# Patient Record
Sex: Female | Born: 1955 | Race: Black or African American | Hispanic: No | Marital: Married | Smoking: Never smoker
Health system: Southern US, Community
[De-identification: ages and names within clinical notes are randomized; demographics above are authoritative.]

## PROBLEM LIST (undated history)

## (undated) DIAGNOSIS — F419 Anxiety disorder, unspecified: Secondary | ICD-10-CM

## (undated) DIAGNOSIS — I10 Essential (primary) hypertension: Secondary | ICD-10-CM

## (undated) DIAGNOSIS — D509 Iron deficiency anemia, unspecified: Secondary | ICD-10-CM

---

## 1997-12-05 ENCOUNTER — Ambulatory Visit (HOSPITAL_COMMUNITY): Admission: RE | Admit: 1997-12-05 | Discharge: 1997-12-05 | Payer: Self-pay | Admitting: Obstetrics & Gynecology

## 1998-03-16 ENCOUNTER — Other Ambulatory Visit: Admission: RE | Admit: 1998-03-16 | Discharge: 1998-03-16 | Payer: Self-pay | Admitting: Obstetrics & Gynecology

## 2002-05-03 ENCOUNTER — Other Ambulatory Visit: Admission: RE | Admit: 2002-05-03 | Discharge: 2002-05-03 | Payer: Self-pay | Admitting: Obstetrics and Gynecology

## 2002-05-09 ENCOUNTER — Encounter: Payer: Self-pay | Admitting: Obstetrics and Gynecology

## 2002-05-09 ENCOUNTER — Ambulatory Visit (HOSPITAL_COMMUNITY): Admission: RE | Admit: 2002-05-09 | Discharge: 2002-05-09 | Payer: Self-pay | Admitting: Obstetrics and Gynecology

## 2012-05-22 ENCOUNTER — Emergency Department (HOSPITAL_COMMUNITY): Payer: BC Managed Care – PPO

## 2012-05-22 ENCOUNTER — Encounter (HOSPITAL_COMMUNITY): Payer: Self-pay | Admitting: Emergency Medicine

## 2012-05-22 ENCOUNTER — Emergency Department (HOSPITAL_COMMUNITY)
Admission: EM | Admit: 2012-05-22 | Discharge: 2012-05-22 | Disposition: A | Discharge status: Home / Self Care | Payer: BC Managed Care – PPO | Attending: Emergency Medicine | Admitting: Emergency Medicine

## 2012-05-22 DIAGNOSIS — I10 Essential (primary) hypertension: Secondary | ICD-10-CM | POA: Insufficient documentation

## 2012-05-22 DIAGNOSIS — Z862 Personal history of diseases of the blood and blood-forming organs and certain disorders involving the immune mechanism: Secondary | ICD-10-CM | POA: Insufficient documentation

## 2012-05-22 DIAGNOSIS — R42 Dizziness and giddiness: Secondary | ICD-10-CM | POA: Insufficient documentation

## 2012-05-22 DIAGNOSIS — R51 Headache: Secondary | ICD-10-CM | POA: Insufficient documentation

## 2012-05-22 DIAGNOSIS — M7989 Other specified soft tissue disorders: Secondary | ICD-10-CM | POA: Insufficient documentation

## 2012-05-22 DIAGNOSIS — Z8659 Personal history of other mental and behavioral disorders: Secondary | ICD-10-CM | POA: Insufficient documentation

## 2012-05-22 DIAGNOSIS — I160 Hypertensive urgency: Secondary | ICD-10-CM

## 2012-05-22 DIAGNOSIS — R110 Nausea: Secondary | ICD-10-CM | POA: Insufficient documentation

## 2012-05-22 HISTORY — DX: Iron deficiency anemia, unspecified: D50.9

## 2012-05-22 HISTORY — DX: Anxiety disorder, unspecified: F41.9

## 2012-05-22 HISTORY — DX: Essential (primary) hypertension: I10

## 2012-05-22 LAB — CBC
Hemoglobin: 14.1 g/dL (ref 12.0–15.0)
MCH: 28.4 pg (ref 26.0–34.0)
MCHC: 35.1 g/dL (ref 30.0–36.0)
MCV: 81 fL (ref 78.0–100.0)
Platelets: 223 10*3/uL (ref 150–400)
RBC: 4.96 MIL/uL (ref 3.87–5.11)
RDW: 12.6 % (ref 11.5–15.5)

## 2012-05-22 LAB — BASIC METABOLIC PANEL
BUN: 10 mg/dL (ref 6–23)
Chloride: 99 mEq/L (ref 96–112)
Creatinine, Ser: 0.74 mg/dL (ref 0.50–1.10)
GFR calc Af Amer: >90 mL/min (ref >90)
GFR calc non Af Amer: >90 mL/min (ref >90)
Glucose, Bld: 108 mg/dL — ABNORMAL HIGH (ref 70–99)
Potassium: 3.4 mEq/L — ABNORMAL LOW (ref 3.5–5.1)
Sodium: 138 mEq/L (ref 135–145)

## 2012-05-22 LAB — POCT I-STAT TROPONIN I: Troponin i, poc: 0 ng/mL (ref 0.00–0.08)

## 2012-05-22 MED ORDER — ONDANSETRON HCL 4 MG PO TABS: 4.0000 mg | ORAL_TABLET | Freq: Four times a day (QID) | ORAL | Status: DC

## 2012-05-22 MED ORDER — ONDANSETRON HCL 4 MG/2ML IJ SOLN
4.0000 mg | Freq: Once | INTRAMUSCULAR | Status: AC
  Administered 2012-05-22: 4 mg via INTRAVENOUS
  Filled 2012-05-22: qty 2

## 2012-05-22 MED ORDER — ALBUTEROL SULFATE HFA 108 (90 BASE) MCG/ACT IN AERS
2.0000 | INHALATION_SPRAY | Freq: Once | RESPIRATORY_TRACT | Status: AC
  Administered 2012-05-22: 2 via RESPIRATORY_TRACT
  Filled 2012-05-22: qty 6.7

## 2012-05-22 MED ORDER — ALBUTEROL SULFATE (5 MG/ML) 0.5% IN NEBU
5.0000 mg | INHALATION_SOLUTION | Freq: Once | RESPIRATORY_TRACT | Status: AC
  Administered 2012-05-22: 5 mg via RESPIRATORY_TRACT
  Filled 2012-05-22: qty 1

## 2012-05-22 MED ORDER — LABETALOL HCL 5 MG/ML IV SOLN
5.0000 mg | Freq: Once | INTRAVENOUS | Status: AC
  Administered 2012-05-22: 5 mg via INTRAVENOUS
  Filled 2012-05-22: qty 4

## 2012-05-22 NOTE — ED Notes (Addendum)
Per EMS pt came from PCP to be evaluated for high blood pressure - 240/120. Pt was at PCP to be evaluated for migraine. Pt has hx of HTN. PT got 4mg  zofran IV for nausea. Pt now states HA feels better and has no other complaints.

## 2012-05-22 NOTE — ED Provider Notes (Signed)
History     CSN: [id number]  Arrival date & time 05/22/12  1643   First MD Initiated Contact with Patient 05/22/12 1652      Chief Complaint  Patient presents with  . Hypertension  . Nausea    (Consider location/radiation/quality/duration/timing/severity/associated sxs/prior treatment) HPI Comments: 57 year old female with a history of hypertension presents to the emergency department from her PCPs office to be evaluated for high blood pressure. Patient states when she woke up this morning she was feeling nauseous with a headache, went to see her primary care physician around 3 clock this afternoon where her blood pressure was 240/120. Denies history of migraines. She received Zofran for her nausea and states that it helped her headache has decreased from 10/10 to 4/10. Still slightly nauseated. Currently no medication changes for her high blood pressure. She takes both spironolactone and a beta blocker. Admits to leg swelling around her ankles worsening over the past few months. Denies shortness of breath, however states it is difficult to take a deep breath in. This morning she was feeling dizzy which has since subsided. Denies visual disturbance, chest pain, cough or recent illness.  Patient is a 57 y.o. female presenting with hypertension. The history is provided by the patient.  Hypertension Associated symptoms include headaches and nausea. Pertinent negatives include no chest pain, chills, coughing, fever, neck pain or vomiting.    Past Medical History  Diagnosis Date  . Hypertension   . Iron (Fe) deficiency anemia   . Anxiety     History reviewed. No pertinent past surgical history.  No family history on file.  History  Substance Use Topics  . Smoking status: Not on file  . Smokeless tobacco: Not on file  . Alcohol Use:     OB History    Grav Para Term Preterm Abortions TAB SAB Ect Mult Living                  Review of Systems  Constitutional: Negative for  fever, chills and appetite change.  HENT: Negative for neck pain and neck stiffness.   Eyes: Negative for visual disturbance.  Respiratory: Negative for cough and shortness of breath.   Cardiovascular: Positive for leg swelling. Negative for chest pain.  Gastrointestinal: Positive for nausea. Negative for vomiting.  Skin: Negative for color change.  Neurological: Positive for dizziness and headaches. Negative for light-headedness.  All other systems reviewed and are negative.    Allergies  Aspirin; Darvon; and Valium  Home Medications  No current outpatient prescriptions on file.  BP 220/98  Pulse 99  Resp 18  SpO2 98%  Physical Exam  Nursing note and vitals reviewed. Constitutional: She is oriented to person, place, and time. She appears well-developed and well-nourished. No distress.  HENT:  Head: Normocephalic and atraumatic.  Mouth/Throat: Oropharynx is clear and moist.  Eyes: Conjunctivae normal and EOM are normal. Pupils are equal, round, and reactive to light.  Neck: Normal range of motion. Neck supple. No JVD present.  Cardiovascular: Regular rhythm and intact distal pulses.  Tachycardia present.        +1 pitting edema bilateral ankles  Pulmonary/Chest: Effort normal and breath sounds normal. She has no decreased breath sounds. She has no wheezes. She has no rhonchi. She has no rales.       Poor air movement.  Abdominal: Soft. Bowel sounds are normal. There is no tenderness.  Musculoskeletal: Normal range of motion. She exhibits no edema.  Neurological: She is alert and oriented to  person, place, and time. She has normal strength. No cranial nerve deficit or sensory deficit.  Skin: Skin is warm and dry.  Psychiatric: She has a normal mood and affect. Her behavior is normal.    ED Course  Procedures (including critical care time)  Labs Reviewed  BASIC METABOLIC PANEL - Abnormal; Notable for the following:    Potassium 3.4 (*)     Glucose, Bld 108 (*)     All  other components within normal limits  CBC  POCT I-STAT TROPONIN I   Dg Chest 2 View  05/22/2012  *RADIOLOGY REPORT*  Clinical Data: Hypertension and nausea.  CHEST - 2 VIEW  Comparison: None.  Findings: Lungs are clear.  Heart size upper normal.  No pneumothorax or pleural effusion.  IMPRESSION: No acute disease.   Original Report Authenticated By: Holley Dexter, M.D.     Date: 05/22/2012  Rate: 91  Rhythm: normal sinus rhythm  QRS Axis: normal  Intervals: normal  ST/T Wave abnormalities: normal  Conduction Disutrbances:none  Narrative Interpretation: sinus rhythm, biatrial abnormalities  Old EKG Reviewed: none available    1. Hypertensive urgency   2. Nausea       MDM  57 y/o female with HTN urgency of 240/120 in PCPs office today. BP in ED 208/86. Initially nauseated. Headache decreased prior to being seen. Labs unremarkable. Troponin negative. EKG and CXR unremarkable. 6:21 PM Nausea subsided with zofran. 5mg  IV labetolol given prior to nausea subsiding. Blood pressure 186/80. Patient still is having difficulty taking a deep breath in, but denies SOB. Will give breathing treatment and reassess. No concern for PE. She does not meet [facility]'s criteria for PE. Case discussed with Dr. Juleen China who agrees with plan of care. 6:49 PM Blood pressure 180/80 on monitor in room. States breathing treatment made it a little easier to take a deep breath. Lung sounds clear. Still without nausea. Albuterol inhaler given. She will f/u with PCP this week for further blood pressure control. Return precautions discussed. Patient states understanding of plan and is agreeable.        Trevor Mace, PA-C 05/22/12 1850

## 2012-05-24 ENCOUNTER — Ambulatory Visit
Admission: RE | Admit: 2012-05-24 | Discharge: 2012-05-24 | Disposition: A | Discharge status: Home / Self Care | Payer: BC Managed Care – PPO | Source: Ambulatory Visit | Attending: Family Medicine | Admitting: Family Medicine

## 2012-05-24 ENCOUNTER — Other Ambulatory Visit: Payer: Self-pay | Admitting: Family Medicine

## 2012-05-24 DIAGNOSIS — R42 Dizziness and giddiness: Secondary | ICD-10-CM

## 2012-05-25 NOTE — ED Provider Notes (Signed)
Medical screening examination/treatment/procedure(s) were performed by non-physician practitioner and as supervising physician I was immediately available for consultation/collaboration.  Jamea Robicheaux, MD 05/25/12 0741 

## 2017-06-09 ENCOUNTER — Emergency Department (HOSPITAL_COMMUNITY)
Admission: EM | Admit: 2017-06-09 | Discharge: 2017-06-09 | Discharge status: Against Medical Advice | Payer: BLUE CROSS/BLUE SHIELD

## 2017-06-09 ENCOUNTER — Encounter (HOSPITAL_COMMUNITY): Payer: Self-pay | Admitting: Family Medicine

## 2017-06-09 ENCOUNTER — Ambulatory Visit (HOSPITAL_COMMUNITY)
Admission: EM | Admit: 2017-06-09 | Discharge: 2017-06-09 | Disposition: A | Discharge status: Home / Self Care | Payer: BLUE CROSS/BLUE SHIELD | Attending: Internal Medicine | Admitting: Internal Medicine

## 2017-06-09 DIAGNOSIS — I10 Essential (primary) hypertension: Secondary | ICD-10-CM | POA: Diagnosis not present

## 2017-06-09 DIAGNOSIS — I160 Hypertensive urgency: Secondary | ICD-10-CM | POA: Diagnosis not present

## 2017-06-09 LAB — POCT I-STAT, CHEM 8
BUN: 6 mg/dL (ref 6–20)
CALCIUM ION: 1.2 mmol/L (ref 1.15–1.40)
Chloride: 104 mmol/L (ref 101–111)
Creatinine, Ser: 0.9 mg/dL (ref 0.44–1.00)
GLUCOSE: 103 mg/dL — AB (ref 65–99)
HCT: 44 % (ref 36.0–46.0)
HEMOGLOBIN: 15 g/dL (ref 12.0–15.0)
Potassium: 3.9 mmol/L (ref 3.5–5.1)
Sodium: 144 mmol/L (ref 135–145)
TCO2: 28 mmol/L (ref 22–32)

## 2017-06-09 MED ORDER — LISINOPRIL-HYDROCHLOROTHIAZIDE 20-25 MG PO TABS: 2.0000 | ORAL_TABLET | Freq: Every day | ORAL | 0 refills | Status: DC

## 2017-06-09 MED ORDER — CLONIDINE HCL 0.1 MG PO TABS
0.1000 mg | ORAL_TABLET | Freq: Once | ORAL | Status: AC
  Administered 2017-06-09: 0.1 mg via ORAL

## 2017-06-09 MED ORDER — CLONIDINE HCL 0.1 MG PO TABS
ORAL_TABLET | ORAL | Status: AC
  Filled 2017-06-09: qty 1

## 2017-06-09 NOTE — ED Triage Notes (Signed)
[facility] stating patient left the ED and wanted to be seen there.

## 2017-06-09 NOTE — ED Provider Notes (Signed)
[facility]    CSN: [id number] Arrival date & time: 06/09/17  1143     History   Chief Complaint Chief Complaint  Patient presents with  . Hypertension    HPI April [name] is a 62 y.o. female history of hypertension presenting today with high blood pressure.  She went to the ophthalmologist earlier today had her eyes dilated, and was sent to emergency room because her blood pressure was consistently reading around 225/125.  She went to the emergency room and was told that the weight was going to be 3-4 hours to be seen and then presented here at the emergency room as the weight was shorter.  She is currently on lisinopril-HCTZ combo.  She denies any current symptoms at time of ophthalmology visit.  She denies headache, tingling, numbness, backaches, nausea, vomiting, change in vision, decreased urine output, shortness of breath, diaphoresis, jaw pain.  She did say while she was here at the urgent care will getting her blood pressure checked she had a twinge of dull pain that lasted approximately 30 seconds and then resolved.  She states that overall she has not been feeling well because she has been under a lot of stress due to family issues.  She denies any smoking history, or personal history of diabetes.  She does state that multiple family members have had open heart surgery, her mother has had a history of a stroke.  She currently sees Dr. Exie ParodyKabaya? [facility].  In the past she has been on the clonidine patch, she is unsure why she was taken off of this.  She has also been on amlodipine in the past and was taken off of this due to dental disease.  He does take a daily baby aspirin.  HPI  Past Medical History:  Diagnosis Date  . Anxiety   . Hypertension   . Iron (Fe) deficiency anemia     There are no active problems to display for this patient.   History reviewed. No pertinent surgical history.  OB History    No data available       Home Medications     Prior to Admission medications   Medication Sig Start Date End Date Taking? Authorizing Provider  lisinopril-hydrochlorothiazide (PRINZIDE,ZESTORETIC) 20-25 MG tablet Take 2 tablets by mouth daily for 14 days. 06/09/17 06/23/17  Wieters, Junius CreamerHallie C, PA-C    Family History History reviewed. No pertinent family history.  Social History Social History   Tobacco Use  . Smoking status: Never Smoker  . Smokeless tobacco: Never Used  Substance Use Topics  . Alcohol use: Not on file  . Drug use: Not on file     Allergies   Amlodipine; Aspirin; Darvon [propoxyphene hcl]; and Valium [diazepam]   Review of Systems Review of Systems  Constitutional: Negative for chills and fever.  HENT: Negative for ear pain and sore throat.   Eyes: Negative for pain and visual disturbance.  Respiratory: Negative for shortness of breath.   Cardiovascular: Negative for chest pain.  Gastrointestinal: Negative for abdominal pain, nausea and vomiting.  Genitourinary: Negative for decreased urine volume and difficulty urinating.  Neurological: Negative for dizziness, syncope, weakness, light-headedness and headaches.  All other systems reviewed and are negative.    Physical Exam Triage Vital Signs ED Triage Vitals [06/09/17 1241]  Enc Vitals Group     BP (!) 258/96     Pulse Rate (!) 115     Resp 20     Temp 98.5 F (36.9  C)     Temp src      SpO2 100 %     Weight      Height      Head Circumference      Peak Flow      Pain Score      Pain Loc      Pain Edu?      Excl. in GC?    No data found.  Updated Vital Signs BP (!) 258/96   Pulse (!) 115   Temp 98.5 F (36.9 C)   Resp 20   SpO2 100%   Physical Exam  Constitutional: She is oriented to person, place, and time. She appears well-developed and well-nourished. No distress.  HENT:  Head: Normocephalic and atraumatic.  Eyes: Conjunctivae are normal.  Neck: Neck supple.  Cardiovascular: Normal rate and regular rhythm.  No murmur  heard. Pulmonary/Chest: Effort normal and breath sounds normal. No respiratory distress.  Abdominal: Soft. There is no tenderness.  Musculoskeletal: She exhibits no edema.  Neurological: She is alert and oriented to person, place, and time.  Cranial nerves II through XII grossly intact, strength 5 out of 5 and equal bilaterally her shoulders and hips.  Speech normal.  Sensation intact.  Ambulating without abnormality.  Skin: Skin is warm and dry.  Psychiatric: She has a normal mood and affect.  Nursing note and vitals reviewed.    UC Treatments / Results  Labs (all labs ordered are listed, but only abnormal results are displayed) Labs Reviewed  POCT I-STAT, CHEM 8 - Abnormal; Notable for the following components:      Result Value   Glucose, Bld 103 (*)    All other components within normal limits    EKG  EKG Interpretation None       Radiology No results found.  Procedures Procedures (including critical care time)  Medications Ordered in UC Medications  cloNIDine (CATAPRES) tablet 0.1 mg (0.1 mg Oral Given 06/09/17 1325)     Initial Impression / Assessment and Plan / UC Course  I have reviewed the triage vital signs and the nursing notes.  Pertinent labs & imaging results that were available during my care of the patient were reviewed by me and considered in my medical decision making (see chart for details).     She denied any anaphylaxis or difficulty breathing when given clonidine in the past.  We will go ahead and provide 0.1 today in clinic.  Patient currently on lisinopril 20-HCTZ 25, max dose of this medicine appears to be lisinopril 80-HCTZ 50.  Instead of adding a new medicine we will go ahead and increase the dose temporarily.  Advised to take 2 tablets daily instead of 1 daily.  Advised patient to monitor blood pressure in the morning and in the evening and if she has any symptoms.  Advised her to record these and follow-up with her primary care provider next  week.  Advised if her blood pressure drops too low to decrease back to 1.  Advised her to go to the emergency room if she develops any symptoms concerning for MI, stroke, endorgan damage. Discussed strict return precautions. Patient verbalized understanding and is agreeable with plan.   Final Clinical Impressions(s) / UC Diagnoses   Final diagnoses:  Hypertensive urgency    ED Discharge Orders        Ordered    lisinopril-hydrochlorothiazide (PRINZIDE,ZESTORETIC) 20-25 MG tablet  Daily     06/09/17 1342       Controlled Substance  Prescriptions [place] Controlled Substance Registry consulted? Not Applicable   Lew Dawes, [place] 06/09/17 2331

## 2017-06-09 NOTE — Discharge Instructions (Signed)
I am increasing the dose of your current high blood pressure medicine.  Instead of taking 1 tablet daily please take 2 tablets daily.  Please follow-up with your primary care this week.  Check your blood pressure at home twice a day or whenever you feel symptoms.  Please go to emergency room if you start to feel any headache, changes in vision, chest pain, increased shortness of breath, dizziness, lightheadedness, slurring of speech, one-sided weakness, nausea, vomiting.

## 2017-06-09 NOTE — ED Triage Notes (Signed)
Pt here for hypertension. reports that she went to her eye dr and they took it 4 times and it was over 200 sys each time. She takes lisinopril and has been taking it as prescribed. Denies any chest pain, SOB, dizziness and headaches.

## 2017-07-07 ENCOUNTER — Ambulatory Visit (HOSPITAL_COMMUNITY)
Admission: EM | Admit: 2017-07-07 | Discharge: 2017-07-07 | Disposition: A | Discharge status: Home / Self Care | Payer: BLUE CROSS/BLUE SHIELD | Attending: Internal Medicine | Admitting: Internal Medicine

## 2017-07-07 ENCOUNTER — Other Ambulatory Visit: Payer: Self-pay

## 2017-07-07 ENCOUNTER — Encounter (HOSPITAL_COMMUNITY): Payer: Self-pay | Admitting: Emergency Medicine

## 2017-07-07 DIAGNOSIS — I160 Hypertensive urgency: Secondary | ICD-10-CM

## 2017-07-07 MED ORDER — LISINOPRIL-HYDROCHLOROTHIAZIDE 20-25 MG PO TABS: 2.0000 | ORAL_TABLET | Freq: Every day | ORAL | 1 refills | Status: DC

## 2017-07-07 MED ORDER — BISOPROLOL FUMARATE 10 MG PO TABS: 10.0000 mg | ORAL_TABLET | Freq: Every day | ORAL | 1 refills | Status: DC

## 2017-07-07 NOTE — ED Triage Notes (Addendum)
Pt here for hypertension, pt also c/o L eye pain, every time shes been here she says her BP is over 200. Pt working on getting  PCP for blood pressure control. Pt also states she woke up this morning with R hand numbness that went away. Denies any pain. Denies symptoms at this time. No neuro deficits.

## 2017-07-07 NOTE — ED Provider Notes (Signed)
[facility]    CSN: [id number] Arrival date & time: 07/07/17  1116     History   Chief Complaint Chief Complaint  Patient presents with  . Hypertension    HPI April [name] is a 62 y.o. female.   62 year old female with history of anxiety, HTN, iron deficiency comes in for hypertension.  Patient states that she woke up with left eye pain/irritation, which she experiences when her blood pressure is high.  She also woke up with right hand numbness that went away after a few seconds.  She states that she ran out of her antihypertensives 5 days ago, and has not been taking anything.  Her blood pressure ranges from 170-190s at home.  Heart rate ranges from 70s to the 90s. This morning, blood pressure was 182/92 with pulse of 94. She currently denies any chest pain, shortness of breath, weakness, dizziness, syncope, nausea, vomiting.  States she has left eye blurry vision, that is constant for her.  She was seen by ophthalmologist 1 month ago, who told her "old bleeding in the left eye", and told her to go to the emergency department due to hypertension.  Patient states has not seen him back, and has an appointment in 2 months.  She does endorse palpitations, stating she can feel her heartbeat. She denies any recent changes in vision, vision blacking out.  Denies alcohol, caffeine use.  Denies decongestants, antihistamine use.  Denies drug use, specifically denies cocaine use.      Past Medical History:  Diagnosis Date  . Anxiety   . Hypertension   . Iron (Fe) deficiency anemia     There are no active problems to display for this patient.   History reviewed. No pertinent surgical history.  OB History    No data available       Home Medications    Prior to Admission medications   Medication Sig Start Date End Date Taking? Authorizing Provider  IRON PO Take by mouth.   Yes [provider]  POTASSIUM PO Take by mouth.   Yes [provider]    bisoprolol (ZEBETA) 10 MG tablet Take 1 tablet (10 mg total) by mouth daily. 07/07/17   Cathie HoopsYu, Amy V, PA-C  lisinopril-hydrochlorothiazide (PRINZIDE,ZESTORETIC) 20-25 MG tablet Take 2 tablets by mouth daily. 07/07/17   Belinda FisherYu, Amy V, PA-C    Family History No family history on file.  Social History Social History   Tobacco Use  . Smoking status: Never Smoker  . Smokeless tobacco: Never Used  Substance Use Topics  . Alcohol use: Not on file  . Drug use: Not on file     Allergies   Amlodipine; Aspirin; Darvon [propoxyphene hcl]; and Valium [diazepam]   Review of Systems Review of Systems  Reason unable to perform ROS: See HPI as above.     Physical Exam Triage Vital Signs ED Triage Vitals [07/07/17 1229]  Enc Vitals Group     BP (!) 234/94     Pulse Rate (!) 119     Resp 20     Temp 98.1 F (36.7 C)     Temp src      SpO2 100 %     Weight      Height      Head Circumference      Peak Flow      Pain Score      Pain Loc      Pain Edu?      Excl. in  GC?    No data found.  Updated Vital Signs BP (!) 234/94   Pulse (!) 119   Temp 98.1 F (36.7 C)   Resp 20   SpO2 100%   Physical Exam  Constitutional: She is oriented to person, place, and time. She appears well-developed and well-nourished. No distress.  HENT:  Head: Normocephalic and atraumatic.  Eyes: Conjunctivae and EOM are normal. Pupils are equal, round, and reactive to light.  Visual field full to confrontation.  Neck: Normal range of motion. Neck supple.  Cardiovascular: Regular rhythm. Tachycardia present. Exam reveals no gallop, no S3, no S4, no distant heart sounds and no friction rub.  No murmur heard. Pulmonary/Chest: Effort normal and breath sounds normal. No stridor. She has no decreased breath sounds. She has no wheezes. She has no rhonchi. She has no rales.  Neurological: She is alert and oriented to person, place, and time. She has normal strength. She is not disoriented. No cranial nerve deficit  or sensory deficit. She displays a negative Romberg sign. Coordination and gait normal. GCS eye subscore is 4. GCS verbal subscore is 5. GCS motor subscore is 6.  Normal rapid movement, finger to nose.  Skin: She is not diaphoretic.     UC Treatments / Results  Labs (all labs ordered are listed, but only abnormal results are displayed) Labs Reviewed - No data to display  EKG  EKG Interpretation None       Radiology No results found.  Procedures Procedures (including critical care time)  Medications Ordered in UC Medications - No data to display   Initial Impression / Assessment and Plan / UC Course  I have reviewed the triage vital signs and the nursing notes.  Pertinent labs & imaging results that were available during my care of the patient were reviewed by me and considered in my medical decision making (see chart for details).    Case discussed with Dr Dayton Scrape. 62 year old female with history of HTN of her antihypertensives for the past 5 days comes in due to elevated BP. BP of 234/94 with pulse of 119 at triage. She is currently asymptomatic without chest pain, shortness of breath, weakness, dizziness, headache/blurry vision, nausea, vomiting. Neurology exam normal without focal deficits. Tachycardia without irregularity, r/m/g. Will restart BP meds. Prinzide and zebeta refilled for 60 days. Patient to establish PCP care for further evaluation. Resources provided. Patient also to follow up with nephrology for further evaluation needed. Strict return precautions given. Patient expresses understanding and agrees to plan.  Case discussed with Dr Dayton Scrape, who agrees to plan.    Final Clinical Impressions(s) / UC Diagnoses   Final diagnoses:  Hypertensive urgency    ED Discharge Orders        Ordered    bisoprolol (ZEBETA) 10 MG tablet  Daily     07/07/17 1347    lisinopril-hydrochlorothiazide (PRINZIDE,ZESTORETIC) 20-25 MG tablet  Daily     07/07/17 1347       Belinda Fisher, PA-C 07/07/17 1409

## 2017-07-07 NOTE — Discharge Instructions (Signed)
I have refilled your blood pressure medicine.  Please take bisoprolol 10 mg, Prinzide 40-50 mg as directed.  I have given you 60-day supply.  Please follow-up with PCP,  kidney doctor for further evaluation and management of high blood pressure.  Please continue to keep your blood pressure log.  DASH diet as attached.  If experiencing chest pain, shortness of breath, weakness, dizziness, syncope, headache with blurry vision, please go to the emergency department for further evaluation needed.

## 2019-09-27 ENCOUNTER — Emergency Department (HOSPITAL_COMMUNITY)
Admission: EM | Admit: 2019-09-27 | Discharge: 2019-09-27 | Disposition: A | Discharge status: Home / Self Care | Payer: BC Managed Care – PPO | Attending: Emergency Medicine | Admitting: Emergency Medicine

## 2019-09-27 ENCOUNTER — Emergency Department (HOSPITAL_COMMUNITY): Payer: BC Managed Care – PPO

## 2019-09-27 ENCOUNTER — Other Ambulatory Visit: Payer: Self-pay

## 2019-09-27 DIAGNOSIS — R29810 Facial weakness: Secondary | ICD-10-CM | POA: Diagnosis not present

## 2019-09-27 DIAGNOSIS — R000 Tachycardia, unspecified: Secondary | ICD-10-CM | POA: Diagnosis not present

## 2019-09-27 DIAGNOSIS — H538 Other visual disturbances: Secondary | ICD-10-CM | POA: Diagnosis not present

## 2019-09-27 DIAGNOSIS — R079 Chest pain, unspecified: Secondary | ICD-10-CM | POA: Insufficient documentation

## 2019-09-27 DIAGNOSIS — Z79899 Other long term (current) drug therapy: Secondary | ICD-10-CM | POA: Insufficient documentation

## 2019-09-27 DIAGNOSIS — R519 Headache, unspecified: Secondary | ICD-10-CM | POA: Diagnosis not present

## 2019-09-27 DIAGNOSIS — R202 Paresthesia of skin: Secondary | ICD-10-CM | POA: Insufficient documentation

## 2019-09-27 DIAGNOSIS — I10 Essential (primary) hypertension: Secondary | ICD-10-CM | POA: Diagnosis not present

## 2019-09-27 DIAGNOSIS — R200 Anesthesia of skin: Secondary | ICD-10-CM | POA: Diagnosis not present

## 2019-09-27 LAB — COMPREHENSIVE METABOLIC PANEL
ALT: 13 U/L (ref 0–44)
AST: 23 U/L (ref 15–41)
Albumin: 4.2 g/dL (ref 3.5–5.0)
Alkaline Phosphatase: 127 U/L — ABNORMAL HIGH (ref 38–126)
Anion gap: 11 (ref 5–15)
BUN: 8 mg/dL (ref 8–23)
CO2: 26 mmol/L (ref 22–32)
Calcium: 9.6 mg/dL (ref 8.9–10.3)
Chloride: 104 mmol/L (ref 98–111)
Creatinine, Ser: 1.04 mg/dL — ABNORMAL HIGH (ref 0.44–1.00)
GFR calc Af Amer: >60 mL/min (ref >60)
GFR calc non Af Amer: 57 mL/min — ABNORMAL LOW (ref >60)
Glucose, Bld: 106 mg/dL — ABNORMAL HIGH (ref 70–99)
Potassium: 3.2 mmol/L — ABNORMAL LOW (ref 3.5–5.1)
Sodium: 141 mmol/L (ref 135–145)
Total Bilirubin: 0.9 mg/dL (ref 0.3–1.2)
Total Protein: 7.9 g/dL (ref 6.5–8.1)

## 2019-09-27 LAB — CBC
HCT: 43.9 % (ref 36.0–46.0)
Hemoglobin: 14.9 g/dL (ref 12.0–15.0)
MCH: 28.9 pg (ref 26.0–34.0)
MCHC: 33.9 g/dL (ref 30.0–36.0)
MCV: 85.2 fL (ref 80.0–100.0)
Platelets: 235 10*3/uL (ref 150–400)
RBC: 5.15 MIL/uL — ABNORMAL HIGH (ref 3.87–5.11)
RDW: 11.9 % (ref 11.5–15.5)
WBC: 7 10*3/uL (ref 4.0–10.5)
nRBC: 0 % (ref 0.0–0.2)

## 2019-09-27 LAB — TROPONIN I (HIGH SENSITIVITY): Troponin I (High Sensitivity): 9 ng/L (ref <18)

## 2019-09-27 MED ORDER — HYDRALAZINE HCL 20 MG/ML IJ SOLN
10.0000 mg | Freq: Once | INTRAMUSCULAR | Status: AC
  Administered 2019-09-27: 10 mg via INTRAVENOUS
  Filled 2019-09-27: qty 1

## 2019-09-27 MED ORDER — HYDRALAZINE HCL 25 MG PO TABS
25.0000 mg | ORAL_TABLET | Freq: Three times a day (TID) | ORAL | 1 refills | Status: DC
Start: 2019-09-27 — End: 2019-09-30

## 2019-09-27 MED ORDER — CLONIDINE HCL 0.2 MG PO TABS
0.3000 mg | ORAL_TABLET | Freq: Once | ORAL | Status: AC
  Administered 2019-09-27: 0.3 mg via ORAL
  Filled 2019-09-27: qty 1

## 2019-09-27 NOTE — Discharge Instructions (Signed)
Your CT scan did not reveal any signs of an acute stroke, your chest x-ray, EKG, blood work were all reassuring.  It does show that you have high blood pressure which can cause symptoms over time.  You need to take the blood pressure medication exactly as prescribed.  Hydralazine, 1 tablet 3 times a day.  Please measure your blood pressure at the same time every day preferably 2 hours after you take your morning dose.  Record this number and share with your doctor.  See the phone number below to call to make a follow-up appointment.  Return to the emergency department for increasing chest pain, shortness of breath, headache, blurred vision, numbness, weakness or any other severe or concerning symptoms

## 2019-09-27 NOTE — ED Notes (Signed)
Pt returns from ambulating to BR.  Denies any lightheadedness or pain.  Will use bedside commode going forward d/t increased pressure with activity.   Pt alert and calm, very resistant to possibility of admission to hospital.  Reviewed POC and risks of going home at this time.

## 2019-09-27 NOTE — ED Provider Notes (Signed)
[facility] EMERGENCY DEPARTMENT Provider Note   CSN: [id number] Arrival date & time: 09/27/19  4709     History No chief complaint on file.   April [name] is a 64 y.o. female.  HPI    This patient is a 64 year old female, she has a known history of hypertension, some anxiety and a history of iron deficiency anemia.  The patient verbally reports to me that she has been seen multiple times in the past for hypertension including at her family doctor's office but has not seen them in over a year, she has been untreated for her blood pressure in that same amount of time.  She reports that she has been allergic to many medications including amlodipine and lisinopril which she did not tolerate, she was on the clonidine patch but states it would not stick to her so she just stopped taking medications.  This morning she developed chest pain, she developed right arm tingling, she had a headache and was found to have a blood pressure that was 240/160.  Paramedics immediately transported the patient to the hospital.  IV was placed prehospital.  The symptoms have gradually improved and she is currently not having any symptoms in her arm or chest pain.  She also complains of some slight blurred vision which she reports as a lack of clarity when looking at distances.  There has been no difficulty with speech or gait, she has the symptoms intermittently but they seem to be worse today than usual.  Past Medical History:  Diagnosis Date  . Anxiety   . Hypertension   . Iron (Fe) deficiency anemia     There are no problems to display for this patient.   No past surgical history on file.   OB History   No obstetric history on file.     No family history on file.  Social History   Tobacco Use  . Smoking status: Never Smoker  . Smokeless tobacco: Never Used  Substance Use Topics  . Alcohol use: Not on file  . Drug use: Not on file    Home Medications Prior to Admission  medications   Medication Sig Start Date End Date Taking? Authorizing Provider  bisoprolol (ZEBETA) 10 MG tablet Take 1 tablet (10 mg total) by mouth daily. 07/07/17   Tasia Catchings, Amy V, PA-C  hydrALAZINE (APRESOLINE) 25 MG tablet Take 1 tablet (25 mg total) by mouth 3 (three) times daily. 09/27/19   Noemi Chapel, MD  IRON PO Take by mouth.    [provider]  lisinopril-hydrochlorothiazide (PRINZIDE,ZESTORETIC) 20-25 MG tablet Take 2 tablets by mouth daily. Patient not taking: Reported on 09/27/2019 07/07/17   Ok Edwards, PA-C  POTASSIUM PO Take by mouth.    [provider]    Allergies    Amlodipine, Aspirin, Darvon [propoxyphene hcl], and Valium [diazepam]  Review of Systems   Review of Systems  All other systems reviewed and are negative.   Physical Exam Updated Vital Signs BP (!) 146/76   Pulse 89   Temp 98.2 F (36.8 C) (Oral)   Resp 18   Ht 1.626 m (5\' 4" )   Wt 72.6 kg   SpO2 97%   BMI 27.46 kg/m   Physical Exam Vitals and nursing note reviewed.  Constitutional:      General: She is not in acute distress.    Appearance: She is well-developed.  HENT:     Head: Normocephalic and atraumatic.     Mouth/Throat:  Pharynx: No oropharyngeal exudate.  Eyes:     General: No scleral icterus.       Right eye: No discharge.        Left eye: No discharge.     Conjunctiva/sclera: Conjunctivae normal.     Pupils: Pupils are equal, round, and reactive to light.  Neck:     Thyroid: No thyromegaly.     Vascular: No JVD.  Cardiovascular:     Rate and Rhythm: Regular rhythm. Tachycardia present.     Heart sounds: Normal heart sounds. No murmur. No friction rub. No gallop.      Comments: Tachycardic to 120 to 130 bpm, normal pulses at the radial arteries Pulmonary:     Effort: Pulmonary effort is normal. No respiratory distress.     Breath sounds: Normal breath sounds. No wheezing or rales.  Abdominal:     General: Bowel sounds are normal. There is no distension.      Palpations: Abdomen is soft. There is no mass.     Tenderness: There is no abdominal tenderness.  Musculoskeletal:        General: No tenderness. Normal range of motion.     Cervical back: Normal range of motion and neck supple.  Lymphadenopathy:     Cervical: No cervical adenopathy.  Skin:    General: Skin is warm and dry.     Findings: No erythema or rash.  Neurological:     Mental Status: She is alert.     Coordination: Coordination normal.     Comments: Normal speech and coordination, cranial nerves III through XII are normal, there is no pronator drift, there is normal finger-nose-finger, normal strength and sensation in all 4 extremities.  Psychiatric:        Behavior: Behavior normal.     ED Results / Procedures / Treatments   Labs (all labs ordered are listed, but only abnormal results are displayed) Labs Reviewed  CBC - Abnormal; Notable for the following components:      Result Value   RBC 5.15 (*)    All other components within normal limits  COMPREHENSIVE METABOLIC PANEL - Abnormal; Notable for the following components:   Potassium 3.2 (*)    Glucose, Bld 106 (*)    Creatinine, Ser 1.04 (*)    Alkaline Phosphatase 127 (*)    GFR calc non Af Amer 57 (*)    All other components within normal limits  TROPONIN I (HIGH SENSITIVITY)    EKG EKG Interpretation  Date/Time:  Friday Sep 27 2019 07:35:33 EDT Ventricular Rate:  125 PR Interval:    QRS Duration: 94 QT Interval:  347 QTC Calculation: 501 R Axis:   53 Text Interpretation: Sinus tachycardia LAE, consider biatrial enlargement Nonspecific repol abnormality, diffuse leads Prolonged QT interval Since last tracing rate faster Confirmed by Eber Hong ([id number]) on 09/27/2019 7:44:36 AM   Radiology CT Head Wo Contrast  Result Date: 09/27/2019 CLINICAL DATA:  Acute headache, normal neurological exam, had onset of tingling and numbness in RIGHT arm and hand, blurred vision, and slight confusion at 0600 hours,  most symptoms resolved with residual tingling/numbness RIGHT hand and arm, elevated blood pressure at 260/190, history hypertension EXAM: CT HEAD WITHOUT CONTRAST TECHNIQUE: Contiguous axial images were obtained from the base of the skull through the vertex without intravenous contrast. Sagittal and coronal MPR images reconstructed from axial data set. COMPARISON:  05/24/2012 FINDINGS: Brain: Normal ventricular morphology. No midline shift or mass effect. Mild small vessel chronic ischemic changes of  deep cerebral white matter. Questionable small lacunar infarct at the RIGHT caudate head. No intracranial hemorrhage, mass lesion or evidence of cortical infarction. No extra-axial fluid collections. Vascular: No hyperdense vessels. Skull: Intact Sinuses/Orbits: Clear Other: N/A IMPRESSION: Small vessel chronic ischemic changes of deep cerebral white matter. Questionable lacunar infarct at RIGHT caudate head. Electronically Signed   By: Ulyses Southward M.D.   On: 09/27/2019 10:32   DG Chest Port 1 View  Result Date: 09/27/2019 CLINICAL DATA:  Hypertension.  Arm tingling and numbness EXAM: PORTABLE CHEST 1 VIEW COMPARISON:  May 22, 2012. FINDINGS: The lungs are clear. Heart is upper normal in size with pulmonary vascularity normal. No adenopathy. No pneumothorax. No bone lesions. IMPRESSION: Lungs clear.  Heart upper normal in size. Electronically Signed   By: Bretta Bang III M.D.   On: 09/27/2019 07:57    Procedures Procedures (including critical care time)  Medications Ordered in ED Medications  cloNIDine (CATAPRES) tablet 0.3 mg (0.3 mg Oral Given 09/27/19 0747)  hydrALAZINE (APRESOLINE) injection 10 mg (10 mg Intravenous Given 09/27/19 0755)    ED Course  I have reviewed the triage vital signs and the nursing notes.  Pertinent labs & imaging results that were available during my care of the patient were reviewed by me and considered in my medical decision making (see chart for details).      MDM Rules/Calculators/A&P                      The patient is severely hypertensive, she has no significant findings on exam other than tachycardia but neurologically appears well.  I am definitely concerned that the patient could have a hypertensive crisis, she is chronically severely hypertensive which is most certainly going to lead to endorgan damage in 1 way or another.  At this time the patient is adamant that she does not want to stay in the hospital however given the severity of her hypertension and symptoms I would advocate for her to stay if she has any abnormal labs / imaging findings.  We will pursue work-up with EKG, chest x-ray, labs, she does not have any neurologic symptoms however given the numbness in her arm this morning with her mild visual changes will obtain a CT scan of the brain as well.  Recheck of BP is 147/94, feeling well, no sx, CXR and labs normal except for K of 3.2, pt with normal exam at this time - 9:26 AM.  CT pending, anticipate d/c.  CT scan shows lacunar area of possible infarct but nothing acute, I discussed the case with the on-call neurologist Dr. Laurence Slate, he agrees that the patient symptoms do not coordinate or correlate with TIA or strokelike symptoms, more likely hypertensive emergency.  As the blood pressure has been normalized she is now stable for discharge and agreeable.  I do not think she needs to be kept in the hospital, she is agreeable to use hydralazine as patient  Final Clinical Impression(s) / ED Diagnoses Final diagnoses:  Severe hypertension    Rx / DC Orders ED Discharge Orders         Ordered    hydrALAZINE (APRESOLINE) 25 MG tablet  3 times daily     09/27/19 1105           Eber Hong, MD 09/27/19 [phone]

## 2019-09-27 NOTE — ED Triage Notes (Signed)
Presents with EMS with c/o numbness and tingling to arm with blurriness,HA.  Hx of HTN and non-compliance with medication.  On arrival denies any CP, SOB or abd pain.  No N/V/D.   Able to walk to room without acute distress noted.

## 2019-09-29 ENCOUNTER — Emergency Department (HOSPITAL_COMMUNITY)
Admission: EM | Admit: 2019-09-29 | Discharge: 2019-09-29 | Disposition: A | Discharge status: Home / Self Care | Payer: BC Managed Care – PPO | Source: Home / Self Care

## 2019-09-29 ENCOUNTER — Encounter (HOSPITAL_COMMUNITY): Payer: Self-pay | Admitting: Emergency Medicine

## 2019-09-29 ENCOUNTER — Inpatient Hospital Stay (HOSPITAL_COMMUNITY): Payer: BC Managed Care – PPO

## 2019-09-29 ENCOUNTER — Other Ambulatory Visit: Payer: Self-pay

## 2019-09-29 ENCOUNTER — Observation Stay (HOSPITAL_COMMUNITY)
Admission: EM | Admit: 2019-09-29 | Discharge: 2019-09-30 | Disposition: A | Discharge status: Home / Self Care | Payer: BC Managed Care – PPO | Attending: Internal Medicine | Admitting: Internal Medicine

## 2019-09-29 ENCOUNTER — Emergency Department (HOSPITAL_COMMUNITY): Payer: BC Managed Care – PPO

## 2019-09-29 DIAGNOSIS — R519 Headache, unspecified: Secondary | ICD-10-CM | POA: Diagnosis not present

## 2019-09-29 DIAGNOSIS — I6782 Cerebral ischemia: Secondary | ICD-10-CM | POA: Diagnosis not present

## 2019-09-29 DIAGNOSIS — I160 Hypertensive urgency: Secondary | ICD-10-CM

## 2019-09-29 DIAGNOSIS — Z888 Allergy status to other drugs, medicaments and biological substances status: Secondary | ICD-10-CM | POA: Diagnosis not present

## 2019-09-29 DIAGNOSIS — R7989 Other specified abnormal findings of blood chemistry: Secondary | ICD-10-CM | POA: Insufficient documentation

## 2019-09-29 DIAGNOSIS — Z20822 Contact with and (suspected) exposure to covid-19: Secondary | ICD-10-CM | POA: Diagnosis not present

## 2019-09-29 DIAGNOSIS — Z79899 Other long term (current) drug therapy: Secondary | ICD-10-CM | POA: Insufficient documentation

## 2019-09-29 DIAGNOSIS — I5031 Acute diastolic (congestive) heart failure: Secondary | ICD-10-CM | POA: Diagnosis not present

## 2019-09-29 DIAGNOSIS — Z9114 Patient's other noncompliance with medication regimen: Secondary | ICD-10-CM | POA: Diagnosis not present

## 2019-09-29 DIAGNOSIS — I169 Hypertensive crisis, unspecified: Secondary | ICD-10-CM | POA: Diagnosis not present

## 2019-09-29 DIAGNOSIS — Z886 Allergy status to analgesic agent status: Secondary | ICD-10-CM | POA: Insufficient documentation

## 2019-09-29 DIAGNOSIS — R778 Other specified abnormalities of plasma proteins: Secondary | ICD-10-CM

## 2019-09-29 DIAGNOSIS — G4489 Other headache syndrome: Secondary | ICD-10-CM | POA: Diagnosis not present

## 2019-09-29 DIAGNOSIS — E876 Hypokalemia: Secondary | ICD-10-CM | POA: Diagnosis not present

## 2019-09-29 DIAGNOSIS — R079 Chest pain, unspecified: Secondary | ICD-10-CM | POA: Diagnosis not present

## 2019-09-29 DIAGNOSIS — I161 Hypertensive emergency: Principal | ICD-10-CM | POA: Insufficient documentation

## 2019-09-29 DIAGNOSIS — I499 Cardiac arrhythmia, unspecified: Secondary | ICD-10-CM | POA: Diagnosis not present

## 2019-09-29 DIAGNOSIS — R0789 Other chest pain: Secondary | ICD-10-CM | POA: Diagnosis not present

## 2019-09-29 LAB — COMPREHENSIVE METABOLIC PANEL
ALT: 14 U/L (ref 0–44)
ALT: 13 U/L (ref 0–44)
AST: 20 U/L (ref 15–41)
AST: 20 U/L (ref 15–41)
Albumin: 4.1 g/dL (ref 3.5–5.0)
Albumin: 4.5 g/dL (ref 3.5–5.0)
Alkaline Phosphatase: 117 U/L (ref 38–126)
Alkaline Phosphatase: 111 U/L (ref 38–126)
Anion gap: 12 (ref 5–15)
Anion gap: 9 (ref 5–15)
BUN: 15 mg/dL (ref 8–23)
BUN: 14 mg/dL (ref 8–23)
CO2: 25 mmol/L (ref 22–32)
CO2: 25 mmol/L (ref 22–32)
Calcium: 9.5 mg/dL (ref 8.9–10.3)
Calcium: 9.2 mg/dL (ref 8.9–10.3)
Chloride: 105 mmol/L (ref 98–111)
Chloride: 108 mmol/L (ref 98–111)
Creatinine, Ser: 1.2 mg/dL — ABNORMAL HIGH (ref 0.44–1.00)
Creatinine, Ser: 1.05 mg/dL — ABNORMAL HIGH (ref 0.44–1.00)
GFR calc Af Amer: 55 mL/min — ABNORMAL LOW (ref >60)
GFR calc Af Amer: >60 mL/min (ref >60)
GFR calc non Af Amer: 48 mL/min — ABNORMAL LOW (ref >60)
GFR calc non Af Amer: 56 mL/min — ABNORMAL LOW (ref >60)
Glucose, Bld: 109 mg/dL — ABNORMAL HIGH (ref 70–99)
Glucose, Bld: 109 mg/dL — ABNORMAL HIGH (ref 70–99)
Potassium: 3.1 mmol/L — ABNORMAL LOW (ref 3.5–5.1)
Potassium: 3 mmol/L — ABNORMAL LOW (ref 3.5–5.1)
Sodium: 142 mmol/L (ref 135–145)
Sodium: 142 mmol/L (ref 135–145)
Total Bilirubin: 0.7 mg/dL (ref 0.3–1.2)
Total Bilirubin: 0.7 mg/dL (ref 0.3–1.2)
Total Protein: 7.9 g/dL (ref 6.5–8.1)
Total Protein: 8 g/dL (ref 6.5–8.1)

## 2019-09-29 LAB — CBC WITH DIFFERENTIAL/PLATELET
Abs Immature Granulocytes: 0.03 10*3/uL (ref 0.00–0.07)
Basophils Absolute: 0 10*3/uL (ref 0.0–0.1)
Basophils Relative: 0 %
Eosinophils Absolute: 0.1 10*3/uL (ref 0.0–0.5)
Eosinophils Relative: 2 %
HCT: 41.1 % (ref 36.0–46.0)
Hemoglobin: 13.7 g/dL (ref 12.0–15.0)
Immature Granulocytes: 0 %
Lymphocytes Relative: 18 %
Lymphs Abs: 1.4 10*3/uL (ref 0.7–4.0)
MCH: 29.1 pg (ref 26.0–34.0)
MCHC: 33.3 g/dL (ref 30.0–36.0)
MCV: 87.3 fL (ref 80.0–100.0)
Monocytes Absolute: 0.4 10*3/uL (ref 0.1–1.0)
Monocytes Relative: 5 %
Neutro Abs: 5.8 10*3/uL (ref 1.7–7.7)
Neutrophils Relative %: 75 %
Platelets: 238 10*3/uL (ref 150–400)
RBC: 4.71 MIL/uL (ref 3.87–5.11)
RDW: 12 % (ref 11.5–15.5)
WBC: 7.8 10*3/uL (ref 4.0–10.5)
nRBC: 0 % (ref 0.0–0.2)

## 2019-09-29 LAB — HIV ANTIBODY (ROUTINE TESTING W REFLEX): HIV Screen 4th Generation wRfx: NONREACTIVE

## 2019-09-29 LAB — CBC
HCT: 42.8 % (ref 36.0–46.0)
Hemoglobin: 14.5 g/dL (ref 12.0–15.0)
MCH: 29.3 pg (ref 26.0–34.0)
MCHC: 33.9 g/dL (ref 30.0–36.0)
MCV: 86.5 fL (ref 80.0–100.0)
Platelets: 317 10*3/uL (ref 150–400)
RBC: 4.95 MIL/uL (ref 3.87–5.11)
RDW: 12.3 % (ref 11.5–15.5)
WBC: 8.9 10*3/uL (ref 4.0–10.5)
nRBC: 0 % (ref 0.0–0.2)

## 2019-09-29 LAB — TROPONIN I (HIGH SENSITIVITY)
Troponin I (High Sensitivity): 18 ng/L — ABNORMAL HIGH (ref <18)
Troponin I (High Sensitivity): 18 ng/L — ABNORMAL HIGH (ref <18)
Troponin I (High Sensitivity): 27 ng/L — ABNORMAL HIGH (ref <18)
Troponin I (High Sensitivity): 26 ng/L — ABNORMAL HIGH (ref <18)
Troponin I (High Sensitivity): 29 ng/L — ABNORMAL HIGH (ref <18)

## 2019-09-29 LAB — MRSA PCR SCREENING: MRSA by PCR: NEGATIVE

## 2019-09-29 LAB — ECHOCARDIOGRAM COMPLETE
Height: 64 in
Weight: 3012.37 oz

## 2019-09-29 LAB — TSH: TSH: 1.006 u[IU]/mL (ref 0.350–4.500)

## 2019-09-29 LAB — URINALYSIS, ROUTINE W REFLEX MICROSCOPIC
Bacteria, UA: NONE SEEN
Bilirubin Urine: NEGATIVE
Glucose, UA: NEGATIVE mg/dL
Hgb urine dipstick: NEGATIVE
Ketones, ur: NEGATIVE mg/dL
Leukocytes,Ua: NEGATIVE
Nitrite: NEGATIVE
Protein, ur: 100 mg/dL — AB
Specific Gravity, Urine: 1.006 (ref 1.005–1.030)
pH: 7 (ref 5.0–8.0)

## 2019-09-29 LAB — RAPID URINE DRUG SCREEN, HOSP PERFORMED
Amphetamines: NOT DETECTED
Barbiturates: NOT DETECTED
Benzodiazepines: NOT DETECTED
Cocaine: NOT DETECTED
Opiates: NOT DETECTED
Tetrahydrocannabinol: NOT DETECTED

## 2019-09-29 LAB — MAGNESIUM: Magnesium: 1.9 mg/dL (ref 1.7–2.4)

## 2019-09-29 LAB — SARS CORONAVIRUS 2 BY RT PCR (HOSPITAL ORDER, PERFORMED IN ~~LOC~~ HOSPITAL LAB): SARS Coronavirus 2: NEGATIVE

## 2019-09-29 MED ORDER — SODIUM CHLORIDE 0.9% FLUSH
3.0000 mL | Freq: Two times a day (BID) | INTRAVENOUS | Status: DC
  Administered 2019-09-29 – 2019-09-30 (×3): 3 mL via INTRAVENOUS

## 2019-09-29 MED ORDER — ONDANSETRON HCL 4 MG PO TABS: 4.0000 mg | ORAL_TABLET | Freq: Four times a day (QID) | ORAL | Status: DC | PRN

## 2019-09-29 MED ORDER — SODIUM CHLORIDE 0.9% FLUSH: 3.0000 mL | INTRAVENOUS | Status: DC | PRN

## 2019-09-29 MED ORDER — HYDRALAZINE HCL 20 MG/ML IJ SOLN
20.0000 mg | Freq: Once | INTRAMUSCULAR | Status: AC
  Administered 2019-09-29: 20 mg via INTRAVENOUS
  Filled 2019-09-29: qty 1

## 2019-09-29 MED ORDER — LABETALOL HCL 5 MG/ML IV SOLN
0.5000 mg/min | INTRAVENOUS | Status: DC
  Administered 2019-09-29: 0.5 mg/min via INTRAVENOUS
  Filled 2019-09-29: qty 100

## 2019-09-29 MED ORDER — ONDANSETRON HCL 4 MG/2ML IJ SOLN
4.0000 mg | Freq: Four times a day (QID) | INTRAMUSCULAR | Status: DC | PRN
  Administered 2019-09-29: 4 mg via INTRAVENOUS
  Filled 2019-09-29: qty 2

## 2019-09-29 MED ORDER — HYDROCHLOROTHIAZIDE 25 MG PO TABS
25.0000 mg | ORAL_TABLET | Freq: Every day | ORAL | Status: DC
  Administered 2019-09-29: 25 mg via ORAL
  Filled 2019-09-29 (×2): qty 1

## 2019-09-29 MED ORDER — ENOXAPARIN SODIUM 40 MG/0.4ML ~~LOC~~ SOLN
40.0000 mg | SUBCUTANEOUS | Status: DC
  Administered 2019-09-29: 40 mg via SUBCUTANEOUS
  Filled 2019-09-29: qty 0.4

## 2019-09-29 MED ORDER — POTASSIUM CHLORIDE CRYS ER 20 MEQ PO TBCR
40.0000 meq | EXTENDED_RELEASE_TABLET | Freq: Once | ORAL | Status: AC
  Administered 2019-09-29: 40 meq via ORAL
  Filled 2019-09-29: qty 2

## 2019-09-29 MED ORDER — METOPROLOL TARTRATE 5 MG/5ML IV SOLN
5.0000 mg | INTRAVENOUS | Status: DC | PRN
  Administered 2019-09-29 (×3): 5 mg via INTRAVENOUS
  Filled 2019-09-29 (×3): qty 5

## 2019-09-29 MED ORDER — CHLORHEXIDINE GLUCONATE CLOTH 2 % EX PADS
6.0000 | MEDICATED_PAD | Freq: Every day | CUTANEOUS | Status: DC
  Administered 2019-09-29 – 2019-09-30 (×2): 6 via TOPICAL

## 2019-09-29 MED ORDER — SENNOSIDES-DOCUSATE SODIUM 8.6-50 MG PO TABS: 1.0000 | ORAL_TABLET | Freq: Every evening | ORAL | Status: DC | PRN

## 2019-09-29 MED ORDER — SODIUM CHLORIDE 0.9 % IV SOLN: 250.0000 mL | INTRAVENOUS | Status: DC | PRN

## 2019-09-29 MED ORDER — ACETAMINOPHEN 325 MG PO TABS: 650.0000 mg | ORAL_TABLET | Freq: Four times a day (QID) | ORAL | Status: DC | PRN

## 2019-09-29 MED ORDER — POTASSIUM CHLORIDE CRYS ER 20 MEQ PO TBCR
40.0000 meq | EXTENDED_RELEASE_TABLET | ORAL | Status: AC
  Administered 2019-09-29 (×3): 40 meq via ORAL
  Filled 2019-09-29 (×3): qty 2

## 2019-09-29 MED ORDER — ACETAMINOPHEN 650 MG RE SUPP: 650.0000 mg | Freq: Four times a day (QID) | RECTAL | Status: DC | PRN

## 2019-09-29 MED ORDER — METOPROLOL TARTRATE 25 MG PO TABS
25.0000 mg | ORAL_TABLET | Freq: Two times a day (BID) | ORAL | Status: DC
  Administered 2019-09-29 (×2): 25 mg via ORAL
  Filled 2019-09-29 (×2): qty 1

## 2019-09-29 NOTE — ED Triage Notes (Signed)
Pt c/o right arm tingling and hypertension (SBP 200s at home), seen here yesterday for same and started on hydralazine TID. Denies chest pain/shortness of breath. A&O x 4, moves all extremities well, ambulatory to triage without difficulty.

## 2019-09-29 NOTE — ED Triage Notes (Signed)
Patient arrives complaining of hypertension and headache with associated right arm tingling. Patient states she has been compliant with her medications. EMS unable to read manual BP due to it being too high. Patient given 2 doses of nitro en route.

## 2019-09-29 NOTE — ED Provider Notes (Signed)
[place] DEPT Provider Note   CSN: [id number] Arrival date & time: 09/29/19  9622   Time seen 6:00 AM  History Chief Complaint  Patient presents with  . Hypertension  . Headache    April [name] is a 64 y.o. female.  HPI Patient was just seen in the ED on the 18th when she presented with right arm numbness, headache, and hypertension.  Her blood pressure was controlled easily with clonidine orally and hydralazine 10 mg IV.  She had a CT which showed a lacunar infarct and she was discussed with neurology who did not think her symptoms corresponded to the area of the lacunar infarct.  Her blood pressure came down to 297 systolic and she was discharged home on hydralazine 25 mg 3 times a day which she states she is taking.  She states she was asleep however she was awakened from sleep with her right arm and right hand feeling numb and tingling.  Patient is right-handed.  She states she took her blood pressure at home and it was 989 systolic, 10 minutes later was 211 systolic.  She went to [facility] ED and states she had a blood pressure of 268 in triage and she was in the waiting room for 2-1/2 hours and was not seen.  She states she went home and then they called EMS.  She states the numbness in her arm lasted about 40 minutes.  She denies headache but states her left temple felt "strange".  She had some blurred vision to reading 2 days ago.  She denies any change in her vision since.  She denies any slurred speech and her husband verifies that.  She denies any nausea or vomiting.  EMS tonight states they had pumped her blood pressure up to 300 and felt like they had not reached the top of her blood pressure reading.  They gave her nitroglycerin x2 and when she came to our ED her blood pressure was 240/105 with heart rate 123.  Patient has not had a doctor since Covid started.  PCP Patient, No Pcp Per     Past Medical History:  Diagnosis Date  . Anxiety     . Hypertension   . Iron (Fe) deficiency anemia     There are no problems to display for this patient.   History reviewed. No pertinent surgical history.   OB History   No obstetric history on file.     No family history on file.  Social History   Tobacco Use  . Smoking status: Never Smoker  . Smokeless tobacco: Never Used  Substance Use Topics  . Alcohol use: Not on file  . Drug use: Not on file  Lives at home This with spouse  Home Medications Prior to Admission medications   Medication Sig Start Date End Date Taking? Authorizing Provider  bisoprolol (ZEBETA) 10 MG tablet Take 1 tablet (10 mg total) by mouth daily. 07/07/17   Tasia Catchings, Amy V, PA-C  hydrALAZINE (APRESOLINE) 25 MG tablet Take 1 tablet (25 mg total) by mouth 3 (three) times daily. 09/27/19   Noemi Chapel, MD  IRON PO Take by mouth.    [provider]  lisinopril-hydrochlorothiazide (PRINZIDE,ZESTORETIC) 20-25 MG tablet Take 2 tablets by mouth daily. Patient not taking: Reported on 09/27/2019 07/07/17   Ok Edwards, PA-C  POTASSIUM PO Take by mouth.    [provider]    Allergies    Amlodipine, Aspirin, Darvon [propoxyphene hcl], and Valium [diazepam]  Review of Systems   Review of Systems  All other systems reviewed and are negative.   Physical Exam Updated Vital Signs BP (!) 200/81   Pulse 96   Resp 20   SpO2 99%   Physical Exam Vitals and nursing note reviewed.  Constitutional:      Appearance: Normal appearance. She is normal weight.  HENT:     Head: Normocephalic and atraumatic.  Eyes:     Extraocular Movements: Extraocular movements intact.     Conjunctiva/sclera: Conjunctivae normal.     Pupils: Pupils are equal, round, and reactive to light.  Cardiovascular:     Rate and Rhythm: Normal rate and regular rhythm.     Comments: Hyperdynamic Pulmonary:     Effort: Pulmonary effort is normal.     Breath sounds: Normal breath sounds.  Musculoskeletal:        General: Normal  range of motion.     Cervical back: Normal range of motion.  Skin:    General: Skin is warm and dry.  Neurological:     General: No focal deficit present.     Mental Status: She is alert and oriented to person, place, and time.     Cranial Nerves: No cranial nerve deficit.  Psychiatric:        Mood and Affect: Mood normal.        Behavior: Behavior normal.        Thought Content: Thought content normal.     ED Results / Procedures / Treatments   Labs (all labs ordered are listed, but only abnormal results are displayed) Results for orders placed or performed during the hospital encounter of 09/29/19  Comprehensive metabolic panel  Result Value Ref Range   Sodium 142 135 - 145 mmol/L   Potassium 3.0 (L) 3.5 - 5.1 mmol/L   Chloride 108 98 - 111 mmol/L   CO2 25 22 - 32 mmol/L   Glucose, Bld 109 (H) 70 - 99 mg/dL   BUN PENDING 8 - 23 mg/dL   Creatinine, Ser 1.05 (H) 0.44 - 1.00 mg/dL   Calcium 9.2 8.9 - 10.3 mg/dL   Total Protein 8.0 6.5 - 8.1 g/dL   Albumin 4.5 3.5 - 5.0 g/dL   AST 20 15 - 41 U/L   ALT 13 0 - 44 U/L   Alkaline Phosphatase 111 38 - 126 U/L   Total Bilirubin 0.7 0.3 - 1.2 mg/dL   GFR calc non Af Amer 56 (L) >60 mL/min   GFR calc Af Amer >60 >60 mL/min   Anion gap 9 5 - 15  CBC with Differential  Result Value Ref Range   WBC 7.8 4.0 - 10.5 K/uL   RBC 4.71 3.87 - 5.11 MIL/uL   Hemoglobin 13.7 12.0 - 15.0 g/dL   HCT 41.1 36.0 - 46.0 %   MCV 87.3 80.0 - 100.0 fL   MCH 29.1 26.0 - 34.0 pg   MCHC 33.3 30.0 - 36.0 g/dL   RDW 12.0 11.5 - 15.5 %   Platelets 238 150 - 400 K/uL   nRBC 0.0 0.0 - 0.2 %   Neutrophils Relative % 75 %   Neutro Abs 5.8 1.7 - 7.7 K/uL   Lymphocytes Relative 18 %   Lymphs Abs 1.4 0.7 - 4.0 K/uL   Monocytes Relative 5 %   Monocytes Absolute 0.4 0.1 - 1.0 K/uL   Eosinophils Relative 2 %   Eosinophils Absolute 0.1 0.0 - 0.5 K/uL   Basophils Relative 0 %  Basophils Absolute 0.0 0.0 - 0.1 K/uL   Immature Granulocytes 0 %   Abs  Immature Granulocytes 0.03 0.00 - 0.07 K/uL  Troponin I (High Sensitivity)  Result Value Ref Range   Troponin I (High Sensitivity) 18 (H) <18 ng/L   Laboratory interpretation all normal, but CMET PENDING    EKG None  Radiology CT Head Wo Contrast  Result Date: 09/27/2019 CLINICAL DATA:  Acute headache, normal neurological exam, had onset of tingling and numbness in RIGHT arm and hand, blurred vision, and slight confusion at 0600 hours, most symptoms resolved with residual tingling/numbness RIGHT hand and arm, elevated blood pressure at 260/190, history hypertension EXAM: MPRESSION: Small vessel chronic ischemic changes of deep cerebral white matter. Questionable lacunar infarct at RIGHT caudate head. Electronically Signed   By: Lavonia Dana M.D.   On: 09/27/2019 10:32   DG Chest Port 1 View  Result Date: 09/27/2019 CLINICAL DATA:  Hypertension.  Arm tingling and numbnessIMPRESSION: Lungs clear.  Heart upper normal in size. Electronically Signed   By: Lowella Grip III M.D.   On: 09/27/2019 07:57    Procedures .Critical Care Performed by: Rolland Porter, MD Authorized by: Rolland Porter, MD   Critical care provider statement:    Critical care time (minutes):  38   Critical care was necessary to treat or prevent imminent or life-threatening deterioration of the following conditions:  CNS failure or compromise   Critical care was time spent personally by me on the following activities:  Discussions with consultants, examination of patient, obtaining history from patient or surrogate, ordering and review of laboratory studies and re-evaluation of patient's condition   (including critical care time)  Medications Ordered in ED Medications  metoprolol tartrate (LOPRESSOR) injection 5 mg (5 mg Intravenous Given 09/29/19 4656)    ED Course  I have reviewed the triage vital signs and the nursing notes.  Pertinent labs & imaging results that were available during my care of the patient were  reviewed by me and considered in my medical decision making (see chart for details).    MDM Rules/Calculators/A&P                      At the time of my exam patient's blood pressure was 229/91 with heart rate 115.  I ordered Lopressor 5 mg IV every 5 minutes until her blood pressure is down to 812 systolic.  I talked to the patient that this is her second visit in 2 days for uncontrolled hypertension and she may need to be admitted.  6:38 AM after Lopressor 5 mg IV patient's blood pressure is 187/92 with heart rate 98  Patient was turned over to Dr. Vallery Ridge at change of shift, 7:40 AM to get results of her c-Met.  Concerned she could be in renal failure.  Patient's blood pressure at 7:00 was 200/81 with heart rate 96.   Final Clinical Impression(s) / ED Diagnoses Final diagnoses:  Hypertensive urgency    Rx / DC Orders  Disposition per Dr. Vallery Ridge.  Rolland Porter, MD, Barbette Or, MD 09/29/19 [phone]

## 2019-09-29 NOTE — ED Provider Notes (Signed)
Patient presents with recurrence of severe hypertension.  She had been treated day before yesterday and improved significantly.  Dr. Lynelle Doctor has initiated diagnostic evaluation and treatment with Lopressor 5 mg every 5 minutes as needed. Physical Exam  BP (!) 194/94   Pulse 98   Resp 17   SpO2 95%   Physical Exam Patient is alert and appropriate.  Clear mental status.  Speech is normal.  Movements purposeful symmetric.  No respiratory distress. ED Course/Procedures     Procedures  MDM  Consult: Reviewed with Dr. Ardyth Harps for admission.  Patient has had rebound of malignant hypertension since yesterday.  Pressures remain elevated despite treatment with Lopressor.  At this time with waxing and waning symptom of tingling of the arm and frontal headache, patient will be admitted for further treatment and diagnostic testing is needed.       Arby Barrette, MD 09/29/19 [phone]

## 2019-09-29 NOTE — Progress Notes (Signed)
  Echocardiogram 2D Echocardiogram has been performed.  Valoria Tamburri A Ahsley Attwood 09/29/2019, 2:39 PM

## 2019-09-29 NOTE — H&P (Signed)
History and Physical    April [name] NWG:[id number] DOB: [birth year] DOA: 09/29/2019  Referring MD/NP/PA: Arby Barrette, EDP PCP: Patient, No Pcp Per  Patient coming from: Home  Chief Complaint: Headache, high blood pressure  HPI: April [name] is a 64 y.o. female with a prior history of hypertension who has not had routine medical care since before the Covid pandemic.  She had run out of her antihypertensive medications approximately 4 to 5 months ago.  2 days ago she noticed a headache with right arm tingling.  When she took her blood pressure at home it was upwards of around 260/100.  She was seen in the emergency department on 5/28.  There was initial thought to admit her but since her blood pressure improved with medications she was sent home on lisinopril/HCTZ and hydralazine.  Over the course of the next day and a half she took her medication as prescribed.  However last night she again started experiencing a severe headache with blood pressure in the same range of around 260/100 at home.  She came to the emergency department and was seen by triage but due to long wait times she went home.  When her headache and hypertension continued she then called EMS.  Upon EMS evaluation she was reported to have a systolic blood pressure above 578.  She was given nitroglycerin x2 as they were unable to secure an IV initially.  She has not complained of chest pain or shortness of breath, no dizziness or lightheadedness.  She has had some blurry vision.  In the emergency department she has been given several doses of IV Lopressor.  Her blood pressure upon my evaluation is 206/94, she still has a headache, she has not had right arm tingling since last night.  On 5/28 she did have a CT scan of the head that showed small vessel chronic ischemic changes and a questionable lacunar infarct at the right caudate head.  At the time of the CT results neurology was consulted who did not believe that her symptoms matched  the area in question of ischemia.  As they have been unable to control blood pressure in the ED, we were asked to admit her for further evaluation and management.  Past Medical/Surgical History: Past Medical History:  Diagnosis Date  . Anxiety   . Hypertension   . Iron (Fe) deficiency anemia     History reviewed. No pertinent surgical history.  Social History:  reports that she has never smoked. She has never used smokeless tobacco. No history on file for alcohol and drug.  Allergies: Allergies  Allergen Reactions  . Amlodipine     Dental issues   . Aspirin Hives  . Clonidine Derivatives Other (See Comments)    unknown  . Darvon [Propoxyphene Hcl] Other (See Comments)    Dizziness  . Lisinopril Other (See Comments)    gout  . Valium [Diazepam] Other (See Comments)    Dizziness    Family History:  No history of heart disease, cancer, stroke that she is aware of  Prior to Admission medications   Medication Sig Start Date End Date Taking? Authorizing Provider  hydrALAZINE (APRESOLINE) 25 MG tablet Take 1 tablet (25 mg total) by mouth 3 (three) times daily. 09/27/19  Yes Eber Hong, MD  bisoprolol (ZEBETA) 10 MG tablet Take 1 tablet (10 mg total) by mouth daily. Patient not taking: Reported on 09/29/2019 07/07/17   Belinda Fisher, PA-C  lisinopril-hydrochlorothiazide (PRINZIDE,ZESTORETIC) 20-25 MG tablet Take 2 tablets  by mouth daily. Patient not taking: Reported on 09/27/2019 07/07/17   Ok Edwards, PA-C    Review of Systems:  Constitutional: Denies fever, chills, diaphoresis, appetite change and fatigue.  HEENT: Denies photophobia, eye pain, redness, hearing loss, ear pain, congestion, sore throat, rhinorrhea, sneezing, mouth sores, trouble swallowing, neck pain, neck stiffness and tinnitus.   Respiratory: Denies SOB, DOE, cough, chest tightness,  and wheezing.   Cardiovascular: Denies chest pain, palpitations and leg swelling.  Gastrointestinal: Denies nausea, vomiting, abdominal  pain, diarrhea, constipation, blood in stool and abdominal distention.  Genitourinary: Denies dysuria, urgency, frequency, hematuria, flank pain and difficulty urinating.  Endocrine: Denies: hot or cold intolerance, sweats, changes in hair or nails, polyuria, polydipsia. Musculoskeletal: Denies myalgias, back pain, joint swelling, arthralgias and gait problem.  Skin: Denies pallor, rash and wound.  Neurological: Denies dizziness, seizures, syncope, weakness. Hematological: Denies adenopathy. Easy bruising, personal or family bleeding history  Psychiatric/Behavioral: Denies suicidal ideation, mood changes, confusion, nervousness, sleep disturbance and agitation    Physical Exam: Vitals:   09/29/19 0930 09/29/19 1000 09/29/19 1030 09/29/19 1033  BP: (!) 195/107 (!) 188/91 (!) 199/93 (!) 199/93  Pulse: 97 86 82   Resp: 20 (!) 21 18   SpO2: 98% 92% 95%      Constitutional: NAD, calm, comfortable Eyes: PERRL, lids and conjunctivae normal ENMT: Mucous membranes are moist.  Neck: normal, supple, no masses, no thyromegaly Respiratory: clear to auscultation bilaterally, no wheezing, no crackles. Normal respiratory effort. No accessory muscle use.  Cardiovascular: Regular rate and rhythm, no murmurs / rubs / gallops.  Trace bilateral lower extremity edema, 2+ pedal pulses. No carotid bruits.  Abdomen: no tenderness, no masses palpated. No hepatosplenomegaly. Bowel sounds positive.  Musculoskeletal: no clubbing / cyanosis. No joint deformity upper and lower extremities. Good ROM, no contractures. Normal muscle tone.  Skin: no rashes, lesions, ulcers. No induration Neurologic: CN 2-12 grossly intact. Sensation intact, DTR normal. Strength 5/5 in all 4.  Psychiatric: Normal judgment and insight. Alert and oriented x 3. Normal mood.    Labs on Admission: I have personally reviewed the following labs and imaging studies  CBC: Recent Labs  Lab 09/27/19 0725 09/29/19 0231 09/29/19 0601  WBC  7.0 8.9 7.8  NEUTROABS  --   --  5.8  HGB 14.9 14.5 13.7  HCT 43.9 42.8 41.1  MCV 85.2 86.5 87.3  PLT 235 317 818   Basic Metabolic Panel: Recent Labs  Lab 09/27/19 0725 09/29/19 0231 09/29/19 0601  NA 141 142 142  K 3.2* 3.1* 3.0*  CL 104 105 108  CO2 26 25 25   GLUCOSE 106* 109* 109*  BUN 8 15 14   CREATININE 1.04* 1.20* 1.05*  CALCIUM 9.6 9.5 9.2   GFR: Estimated Creatinine Clearance: 52.9 mL/min (A) (by C-G formula based on SCr of 1.05 mg/dL (H)). Liver Function Tests: Recent Labs  Lab 09/27/19 0725 09/29/19 0231 09/29/19 0601  AST 23 20 20   ALT 13 14 13   ALKPHOS 127* 117 111  BILITOT 0.9 0.7 0.7  PROT 7.9 7.9 8.0  ALBUMIN 4.2 4.1 4.5   No results for input(s): LIPASE, AMYLASE in the last 168 hours. No results for input(s): AMMONIA in the last 168 hours. Coagulation Profile: No results for input(s): INR, PROTIME in the last 168 hours. Cardiac Enzymes: No results for input(s): CKTOTAL, CKMB, CKMBINDEX, TROPONINI in the last 168 hours. BNP (last 3 results) No results for input(s): PROBNP in the last 8760 hours. HbA1C: No results for input(s):  HGBA1C in the last 72 hours. CBG: No results for input(s): GLUCAP in the last 168 hours. Lipid Profile: No results for input(s): CHOL, HDL, LDLCALC, TRIG, CHOLHDL, LDLDIRECT in the last 72 hours. Thyroid Function Tests: Recent Labs    09/29/19 0755  TSH 1.006   Anemia Panel: No results for input(s): VITAMINB12, FOLATE, FERRITIN, TIBC, IRON, RETICCTPCT in the last 72 hours. Urine analysis:    Component Value Date/Time   COLORURINE STRAW (A) 09/29/2019 0754   APPEARANCEUR CLEAR 09/29/2019 0754   LABSPEC 1.006 09/29/2019 0754   PHURINE 7.0 09/29/2019 0754   GLUCOSEU NEGATIVE 09/29/2019 0754   HGBUR NEGATIVE 09/29/2019 0754   BILIRUBINUR NEGATIVE 09/29/2019 0754   KETONESUR NEGATIVE 09/29/2019 0754   PROTEINUR 100 (A) 09/29/2019 0754   NITRITE NEGATIVE 09/29/2019 0754   LEUKOCYTESUR NEGATIVE 09/29/2019 0754    Sepsis Labs: @LABRCNTIP (procalcitonin:4,lacticidven:4) )No results found for this or any previous visit (from the past 240 hour(s)).   Radiological Exams on Admission: DG Chest 1 View  Result Date: 09/29/2019 CLINICAL DATA:  Hypertensive crisis. EXAM: CHEST  1 VIEW COMPARISON:  09/27/2019 FINDINGS: The heart size and mediastinal contours are within normal limits. Both lungs are clear. The visualized skeletal structures are unremarkable. IMPRESSION: No active disease. Electronically Signed   By: 09/29/2019 M.D.   On: 09/29/2019 08:22    EKG: Independently reviewed.  Normal sinus rhythm at a rate of 94, significant left ventricular hypertrophy with repolarization abnormality, signs of biatrial enlargement, no acute ST or T wave changes  Assessment/Plan Principal Problem:   Hypertensive urgency Active Problems:   Elevated troponin   Hypokalemia    Hypertensive urgency -Due to significantly elevated blood pressures and headache will call it as such. -Admit to stepdown unit, place on IV labetalol drip with goal systolic blood pressure around 160, hold for heart rate less than 60. -Will also start on hydrochlorothiazide and metoprolol. -She shows no signs of pulmonary edema or volume overload. -I will go ahead and order a 2D echo as I suspect she has diastolic heart failure given findings on EKG. -She currently has no focal deficits, the right upper extremity tingling was temporary and she already had a CT scan of the head on 5/28, because of this I will hold further brain imaging but can reconsider if she has any changes. -Check A1c and lipids to further risk stratify her.  Elevated troponin -Suspect likely due to hypertensive urgency and not ACS. -Nonetheless we will cycle troponins and recheck EKG in a.m. -2D echo has been requested.  Hypokalemia -Replete orally and check magnesium levels.   DVT prophylaxis: Lovenox Code Status: Full code Family Communication: Patient  only Disposition Plan: Admit to stepdown unit given labetalol drip, would anticipate 2 night stay minimum in hospital. Consults called: None Admission status: Admit - It is my clinical opinion that admission to INPATIENT is reasonable and necessary because of the expectation that this patient will require hospital care that crosses at least 2 midnights to treat this condition based on the medical complexity of the problems presented.  Given the aforementioned information, the predictability of an adverse outcome is felt to be significant.     Time Spent: 75 minutes  Forrest Jaroszewski 6/28 MD [facility] Pager [phone]  If 7PM-7AM, please contact night-coverage [email or web address] Password [facility]  09/29/2019, 10:43 AM

## 2019-09-29 NOTE — ED Notes (Addendum)
Pt stated to tech that her right arm was feeling numb. Tech brought pt to triage and did manual BP 260/89.

## 2019-09-29 NOTE — ED Notes (Signed)
Pt was seen walking towards lobby exit with husband. This NT asks if the patient is leaving. The patient stated "I'm going home." the husband stated "we going call the ambulance when we get there and see if that moves [place] along quicker." triage RN notified

## 2019-09-30 DIAGNOSIS — I160 Hypertensive urgency: Secondary | ICD-10-CM | POA: Diagnosis not present

## 2019-09-30 LAB — BASIC METABOLIC PANEL
Anion gap: 7 (ref 5–15)
BUN: 16 mg/dL (ref 8–23)
CO2: 22 mmol/L (ref 22–32)
Calcium: 8.9 mg/dL (ref 8.9–10.3)
Chloride: 110 mmol/L (ref 98–111)
Creatinine, Ser: 1.3 mg/dL — ABNORMAL HIGH (ref 0.44–1.00)
GFR calc Af Amer: 50 mL/min — ABNORMAL LOW (ref >60)
GFR calc non Af Amer: 43 mL/min — ABNORMAL LOW (ref >60)
Glucose, Bld: 98 mg/dL (ref 70–99)
Potassium: 4.6 mmol/L (ref 3.5–5.1)
Sodium: 139 mmol/L (ref 135–145)

## 2019-09-30 LAB — CBC
HCT: 38.3 % (ref 36.0–46.0)
Hemoglobin: 12.6 g/dL (ref 12.0–15.0)
MCH: 29.5 pg (ref 26.0–34.0)
MCHC: 32.9 g/dL (ref 30.0–36.0)
MCV: 89.7 fL (ref 80.0–100.0)
Platelets: 289 10*3/uL (ref 150–400)
RBC: 4.27 MIL/uL (ref 3.87–5.11)
RDW: 12.7 % (ref 11.5–15.5)
WBC: 8.3 10*3/uL (ref 4.0–10.5)
nRBC: 0 % (ref 0.0–0.2)

## 2019-09-30 MED ORDER — METOPROLOL SUCCINATE ER 25 MG PO TB24
50.0000 mg | ORAL_TABLET | Freq: Every day | ORAL | Status: DC
  Administered 2019-09-30: 50 mg via ORAL
  Filled 2019-09-30: qty 2

## 2019-09-30 MED ORDER — METOPROLOL SUCCINATE ER 50 MG PO TB24: 50.0000 mg | ORAL_TABLET | Freq: Every day | ORAL | 1 refills | Status: DC

## 2019-09-30 NOTE — Discharge Summary (Signed)
Physician Discharge Summary  April [name]:[id number] DOB: 1956-03-25   PCP: Patient, No Pcp Per  Admit date: 09/29/2019 Discharge date: 09/30/2019 Length of Stay: 1 days   Code Status: Full Code  Admitted From:  Home Discharged to:   Home Home Health:  None  Equipment/Devices:  None Discharge Condition:  Stable  Recommendations for Outpatient Follow-up   1. Follow up with PCP in 1 week 2. Follow up BMP - renal function 3. Lisinopril-HCTZ held on discharge due to slight bump in Creatinine, probably from BP changes. Consider restarting if necessary once renal function is improved  Hospital Summary  This is a 64 year old female with a history of hypertension who was lost to routine medical care since before the COVID 19 pandemic and stopped taking her antihypertensives about 4-5 months ago. Two days prior to presentation she noticed headache with right arm tingling.  BP at home was 260/100 and presented to ED on 5/28.  BP improved at that time with medications and sent home on lisinopril/hydrochlorothiazide and hydralazine.  Over the next day or so she took the medication prescribed however began experiencing a severe headache with elevated blood pressure around the same number.  Presented back to the ED but left prior to being seen.  Bad night patient had recurrence of headache and hypertension then called EMS.  Apparently she had SBP >300.  She was given nitroglycerin x2 but she was unable to get the secured IV initially and did not complain of any chest pain or shortness of breath, lightheadedness or dizziness.  Did have some blurry vision.  In the ED she was given several doses of Lopressor and BP had proved but still significantly elevated.  Recent CT scan of her head showed small vessel chronic ischemic changes and possible lacunar infarct in right caudate head.  She was admitted for further blood pressure control for hypertensive emergency.  Echo on 5/30 showed EF 70 to 75% with grade 1  diastolic dysfunction and hyperdynamic left ventricle but otherwise unremarkable.  Creatinine increased slightly from 1.05-1.3 and lisinopril-HCTZ was held.  Antihypertensives changed to Toprol XL 50 mg and patient had stable blood pressure at 140/52 and HR 83 with resolution of symptoms.  She was discharged on Toprol-XL 50 mg daily monotherapy, advised to get follow-up BMP with close follow-up with PCP.  A & P   Principal Problem:   Hypertensive urgency Active Problems:   Elevated troponin   Hypokalemia   1. Hypertensive urgency with right arm pain and headache, Due to medication noncompliance for several months 1. Symptoms resolved 2. Recent CT head scan without any acute changes   3. Echo on 5/30 showed EF 70 to 75% with grade 1 diastolic dysfunction and hyperdynamic left ventricle but otherwise unremarkable. 4. Holding home bisoprolol, lisinopril-HCTZ and hydralazine as patient tolerated Toprol-XL 50 mg monotherapy with stable blood pressure on multiple readings in the 140s 5. Once renal function improves consider reinstituting ACE inhibitor with close follow-up outpatient  2. Elevated troponin, likely from demand ischemia 1. HS Troponin 26->29 2. EKG at baseline with no events on telemetry  3. Hypokalemia, resolved  4. Elevated creatinine secondary to hemodynamic changes 1. Holding ACE inhibitor and HCTZ 2. Follow-up BMP outpatient and plan as above    Consultants  . None  Procedures  . none  Antibiotics   Anti-infectives (From admission, onward)   None       Subjective  Patient seen and examined at bedside no acute distress and  resting comfortably.  No events overnight.  Tolerating diet. In good spirits and anticipating discharge.   Denies any chest pain, shortness of breath, fever, nausea, vomiting, urinary or bowel complaints. Otherwise ROS negative    Objective   Discharge Exam: Vitals:   09/30/19 1149 09/30/19 1200  BP: (!) 140/52   Pulse:    Resp:  20   Temp:  98.5 F (36.9 C)  SpO2:     Vitals:   09/30/19 0800 09/30/19 0945 09/30/19 1149 09/30/19 1200  BP:  (!) 147/44 (!) 140/52   Pulse:      Resp:  19 20   Temp: 98.5 F (36.9 C)   98.5 F (36.9 C)  TempSrc: Oral   Oral  SpO2:      Weight:      Height:        Physical Exam Vitals and nursing note reviewed.  Constitutional:      Appearance: Normal appearance.  HENT:     Head: Normocephalic and atraumatic.  Eyes:     Conjunctiva/sclera: Conjunctivae normal.  Cardiovascular:     Rate and Rhythm: Normal rate and regular rhythm.  Pulmonary:     Effort: Pulmonary effort is normal.     Breath sounds: Normal breath sounds.  Abdominal:     General: Abdomen is flat.     Palpations: Abdomen is soft.  Musculoskeletal:        General: No swelling or tenderness.  Skin:    Coloration: Skin is not jaundiced or pale.  Neurological:     Mental Status: She is alert. Mental status is at baseline.  Psychiatric:        Mood and Affect: Mood normal.        Behavior: Behavior normal.       The results of significant diagnostics from this hospitalization (including imaging, microbiology, ancillary and laboratory) are listed below for reference.     Microbiology: Recent Results (from the past 240 hour(s))  SARS Coronavirus 2 by RT PCR (hospital order, performed in [facility] hospital lab) Nasopharyngeal Nasopharyngeal Swab     Status: None   Collection Time: 09/29/19  8:44 AM   Specimen: Nasopharyngeal Swab  Result Value Ref Range Status   SARS Coronavirus 2 NEGATIVE NEGATIVE Final    Comment: (NOTE) SARS-CoV-2 target nucleic acids are NOT DETECTED. The SARS-CoV-2 RNA is generally detectable in upper and lower respiratory specimens during the acute phase of infection. The lowest concentration of SARS-CoV-2 viral copies this assay can detect is 250 copies / mL. A negative result does not preclude SARS-CoV-2 infection and should not be used as the sole basis for treatment  or other patient management decisions.  A negative result may occur with improper specimen collection / handling, submission of specimen other than nasopharyngeal swab, presence of viral mutation(s) within the areas targeted by this assay, and inadequate number of viral copies (<250 copies / mL). A negative result must be combined with clinical observations, patient history, and epidemiological information. Fact Sheet for Patients:   StrictlyIdeas.no Fact Sheet for Healthcare Providers: BankingDealers.co.za This test is not yet approved or cleared  by the [place] FDA and has been authorized for detection and/or diagnosis of SARS-CoV-2 by FDA under an Emergency Use Authorization (EUA).  This EUA will remain in effect (meaning this test can be used) for the duration of the COVID-19 declaration under Section 564(b)(1) of the Act, 21 U.S.C. section 360bbb-3(b)(1), unless the authorization is terminated or revoked sooner. Performed at [facility], [address], [place], [place] [address]   MRSA PCR Screening     Status: None   Collection Time: 09/29/19 12:16 PM   Specimen: Nasopharyngeal  Result Value Ref Range Status   MRSA by PCR NEGATIVE NEGATIVE Final    Comment:        The GeneXpert MRSA Assay (FDA approved for NASAL specimens only), is one component of a comprehensive MRSA colonization surveillance program. It is not intended to diagnose MRSA infection nor to guide or monitor treatment for MRSA infections. Performed at [facility], [address], [place], [place] [address]      Labs: BNP (last 3 results) No results for input(s): BNP in the last 8760 hours. Basic Metabolic Panel: Recent Labs  Lab 09/27/19 0725 09/29/19 0231 09/29/19 0601 09/29/19 1236 09/30/19 0732  NA 141 142 142  --  139  K 3.2* 3.1* 3.0*  --  4.6  CL 104 105 108  --  110  CO2 26 25 25   --  22   GLUCOSE 106* 109* 109*  --  98  BUN 8 15 14   --  16  CREATININE 1.04* 1.20* 1.05*  --  1.30*  CALCIUM 9.6 9.5 9.2  --  8.9  MG  --   --   --  1.9  --    Liver Function Tests: Recent Labs  Lab 09/27/19 0725 09/29/19 0231 09/29/19 0601  AST 23 20 20   ALT 13 14 13   ALKPHOS 127* 117 111  BILITOT 0.9 0.7 0.7  PROT 7.9 7.9 8.0  ALBUMIN 4.2 4.1 4.5   No results for input(s): LIPASE, AMYLASE in the last 168 hours. No results for input(s): AMMONIA in the last 168 hours. CBC: Recent Labs  Lab 09/27/19 0725 09/29/19 0231 09/29/19 0601 09/30/19 0248  WBC 7.0 8.9 7.8 8.3  NEUTROABS  --   --  5.8  --   HGB 14.9 14.5 13.7 12.6  HCT 43.9 42.8 41.1 38.3  MCV 85.2 86.5 87.3 89.7  PLT 235 317 238 289   Cardiac Enzymes: No results for input(s): CKTOTAL, CKMB, CKMBINDEX, TROPONINI in the last 168 hours. BNP: Invalid input(s): POCBNP CBG: No results for input(s): GLUCAP in the last 168 hours. D-Dimer No results for input(s): DDIMER in the last 72 hours. Hgb A1c No results for input(s): HGBA1C in the last 72 hours. Lipid Profile No results for input(s): CHOL, HDL, LDLCALC, TRIG, CHOLHDL, LDLDIRECT in the last 72 hours. Thyroid function studies Recent Labs    09/29/19 0755  TSH 1.006   Anemia work up No results for input(s): VITAMINB12, FOLATE, FERRITIN, TIBC, IRON, RETICCTPCT in the last 72 hours. Urinalysis    Component Value Date/Time   COLORURINE STRAW (A) 09/29/2019 0754   APPEARANCEUR CLEAR 09/29/2019 0754   LABSPEC 1.006 09/29/2019 0754   PHURINE 7.0 09/29/2019 0754   GLUCOSEU NEGATIVE 09/29/2019 0754   HGBUR NEGATIVE 09/29/2019 0754   BILIRUBINUR NEGATIVE 09/29/2019 0754   KETONESUR NEGATIVE 09/29/2019 0754   PROTEINUR 100 (A) 09/29/2019 0754   NITRITE NEGATIVE 09/29/2019 0754   LEUKOCYTESUR NEGATIVE 09/29/2019 0754   Sepsis Labs Invalid input(s): PROCALCITONIN,  WBC,  LACTICIDVEN Microbiology Recent Results (from the past 240 hour(s))  SARS Coronavirus 2  by RT PCR (hospital order, performed in [facility] hospital lab) Nasopharyngeal Nasopharyngeal Swab     Status: None   Collection Time: 09/29/19  8:44 AM   Specimen: Nasopharyngeal Swab  Result Value Ref Range Status   SARS  Coronavirus 2 NEGATIVE NEGATIVE Final    Comment: (NOTE) SARS-CoV-2 target nucleic acids are NOT DETECTED. The SARS-CoV-2 RNA is generally detectable in upper and lower respiratory specimens during the acute phase of infection. The lowest concentration of SARS-CoV-2 viral copies this assay can detect is 250 copies / mL. A negative result does not preclude SARS-CoV-2 infection and should not be used as the sole basis for treatment or other patient management decisions.  A negative result may occur with improper specimen collection / handling, submission of specimen other than nasopharyngeal swab, presence of viral mutation(s) within the areas targeted by this assay, and inadequate number of viral copies (<250 copies / mL). A negative result must be combined with clinical observations, patient history, and epidemiological information. Fact Sheet for Patients:   BoilerBrush.com.cyhttps://www.fda.gov/media/136312/download Fact Sheet for Healthcare Providers: https://pope.com/https://www.fda.gov/media/136313/download This test is not yet approved or cleared  by the [place] FDA and has been authorized for detection and/or diagnosis of SARS-CoV-2 by FDA under an Emergency Use Authorization (EUA).  This EUA will remain in effect (meaning this test can be used) for the duration of the COVID-19 declaration under Section 564(b)(1) of the Act, 21 U.S.C. section 360bbb-3(b)(1), unless the authorization is terminated or revoked sooner. Performed at [facility], [address] [place], [place] [address]   MRSA PCR Screening     Status: None   Collection Time: 09/29/19 12:16 PM   Specimen: Nasopharyngeal  Result Value Ref Range Status   MRSA by PCR NEGATIVE NEGATIVE Final     Comment:        The GeneXpert MRSA Assay (FDA approved for NASAL specimens only), is one component of a comprehensive MRSA colonization surveillance program. It is not intended to diagnose MRSA infection nor to guide or monitor treatment for MRSA infections. Performed at [facility], [address] [place], [place] [address]     Discharge Instructions     Discharge Instructions    Diet - low sodium heart healthy   Complete by: As directed    Discharge instructions   Complete by: As directed    - stop taking your home blood pressure medications including hydralazine, bisoprolol and lisinopril-hydrochlorothiazide. Instead, start taking Metoprolol succinate (Toprol XL) 50 mg daily.  - get lab work at the end of the week and follow up with your primary care physician for your already scheduled appointment - record your blood pressure daily and bring this to your primary care physician - if you have any worsening of your symptoms or blood pressure readings consistently greater than 180, then contact your primary care physician or return to the ED.   Increase activity slowly   Complete by: As directed      Allergies as of 09/30/2019      Reactions   Amlodipine    Dental issues   Aspirin Hives   Clonidine Derivatives Other (See Comments)   unknown   Darvon [propoxyphene Hcl] Other (See Comments)   Dizziness   Lisinopril Other (See Comments)   gout   Valium [diazepam] Other (See Comments)   Dizziness      Medication List    STOP taking these medications   bisoprolol 10 MG tablet Commonly known as: ZEBETA   hydrALAZINE 25 MG tablet Commonly known as: APRESOLINE   lisinopril-hydrochlorothiazide 20-25 MG tablet Commonly known as: ZESTORETIC     TAKE these medications   metoprolol succinate 50 MG 24 hr tablet Commonly known as: TOPROL-XL Take 1 tablet (50 mg  total) by mouth daily. Start taking on: October 01, 2019       Allergies  Allergen  Reactions  . Amlodipine     Dental issues   . Aspirin Hives  . Clonidine Derivatives Other (See Comments)    unknown  . Darvon [Propoxyphene Hcl] Other (See Comments)    Dizziness  . Lisinopril Other (See Comments)    gout  . Valium [Diazepam] Other (See Comments)    Dizziness    Dispo: The patient is from: Home              Anticipated d/c is to: Home              Anticipated d/c date is: today              Patient currently is medically stable to d/c.        Time coordinating discharge: Over 30 minutes   SIGNED:   Jae Dire, D.O. [facility] Pager: [phone]  09/30/2019, 1:12 PM

## 2019-10-01 ENCOUNTER — Other Ambulatory Visit: Payer: Self-pay

## 2019-10-01 ENCOUNTER — Emergency Department (HOSPITAL_COMMUNITY)
Admission: EM | Admit: 2019-10-01 | Discharge: 2019-10-01 | Disposition: A | Discharge status: Home / Self Care | Payer: BC Managed Care – PPO | Attending: Emergency Medicine | Admitting: Emergency Medicine

## 2019-10-01 ENCOUNTER — Ambulatory Visit: Payer: Self-pay | Admitting: *Deleted

## 2019-10-01 ENCOUNTER — Encounter (HOSPITAL_COMMUNITY): Payer: Self-pay | Admitting: Emergency Medicine

## 2019-10-01 DIAGNOSIS — I10 Essential (primary) hypertension: Secondary | ICD-10-CM | POA: Diagnosis not present

## 2019-10-01 DIAGNOSIS — R079 Chest pain, unspecified: Secondary | ICD-10-CM | POA: Diagnosis not present

## 2019-10-01 DIAGNOSIS — R0789 Other chest pain: Secondary | ICD-10-CM | POA: Insufficient documentation

## 2019-10-01 DIAGNOSIS — G4489 Other headache syndrome: Secondary | ICD-10-CM | POA: Diagnosis not present

## 2019-10-01 DIAGNOSIS — L509 Urticaria, unspecified: Secondary | ICD-10-CM | POA: Diagnosis not present

## 2019-10-01 LAB — CBC WITH DIFFERENTIAL/PLATELET
Abs Immature Granulocytes: 0.02 10*3/uL (ref 0.00–0.07)
Basophils Absolute: 0 10*3/uL (ref 0.0–0.1)
Basophils Relative: 0 %
Eosinophils Absolute: 0.3 10*3/uL (ref 0.0–0.5)
Eosinophils Relative: 5 %
HCT: 41.7 % (ref 36.0–46.0)
Hemoglobin: 13.7 g/dL (ref 12.0–15.0)
Immature Granulocytes: 0 %
Lymphocytes Relative: 23 %
Lymphs Abs: 1.6 10*3/uL (ref 0.7–4.0)
MCH: 29.5 pg (ref 26.0–34.0)
MCHC: 32.9 g/dL (ref 30.0–36.0)
MCV: 89.7 fL (ref 80.0–100.0)
Monocytes Absolute: 0.5 10*3/uL (ref 0.1–1.0)
Monocytes Relative: 7 %
Neutro Abs: 4.5 10*3/uL (ref 1.7–7.7)
Neutrophils Relative %: 65 %
Platelets: 260 10*3/uL (ref 150–400)
RBC: 4.65 MIL/uL (ref 3.87–5.11)
RDW: 12.2 % (ref 11.5–15.5)
WBC: 6.9 10*3/uL (ref 4.0–10.5)
nRBC: 0 % (ref 0.0–0.2)

## 2019-10-01 LAB — COMPREHENSIVE METABOLIC PANEL
ALT: 13 U/L (ref 0–44)
AST: 20 U/L (ref 15–41)
Albumin: 3.9 g/dL (ref 3.5–5.0)
Alkaline Phosphatase: 101 U/L (ref 38–126)
Anion gap: 8 (ref 5–15)
BUN: 13 mg/dL (ref 8–23)
CO2: 25 mmol/L (ref 22–32)
Calcium: 9.5 mg/dL (ref 8.9–10.3)
Chloride: 108 mmol/L (ref 98–111)
Creatinine, Ser: 1.03 mg/dL — ABNORMAL HIGH (ref 0.44–1.00)
GFR calc Af Amer: >60 mL/min (ref >60)
GFR calc non Af Amer: 57 mL/min — ABNORMAL LOW (ref >60)
Glucose, Bld: 102 mg/dL — ABNORMAL HIGH (ref 70–99)
Potassium: 4.5 mmol/L (ref 3.5–5.1)
Sodium: 141 mmol/L (ref 135–145)
Total Bilirubin: 0.7 mg/dL (ref 0.3–1.2)
Total Protein: 7.4 g/dL (ref 6.5–8.1)

## 2019-10-01 LAB — TROPONIN I (HIGH SENSITIVITY): Troponin I (High Sensitivity): 15 ng/L (ref <18)

## 2019-10-01 LAB — URINALYSIS, ROUTINE W REFLEX MICROSCOPIC
Bilirubin Urine: NEGATIVE
Glucose, UA: NEGATIVE mg/dL
Hgb urine dipstick: NEGATIVE
Ketones, ur: NEGATIVE mg/dL
Leukocytes,Ua: NEGATIVE
Nitrite: NEGATIVE
Protein, ur: NEGATIVE mg/dL
Specific Gravity, Urine: 1.004 — ABNORMAL LOW (ref 1.005–1.030)
pH: 6 (ref 5.0–8.0)

## 2019-10-01 MED ORDER — HYDRALAZINE HCL 25 MG PO TABS
25.0000 mg | ORAL_TABLET | Freq: Three times a day (TID) | ORAL | 0 refills | Status: DC
Start: 2019-10-01 — End: 2019-10-07

## 2019-10-01 NOTE — Discharge Instructions (Addendum)
Contact a health care provider if you: Think you are having a reaction to a medicine you are taking. Have headaches that keep coming back (recurring). Feel dizzy. Have swelling in your ankles. Have trouble with your vision. Get help right away if you: Develop a severe headache or confusion. Have unusual weakness or numbness. Feel faint. Have severe pain in your chest or abdomen. Vomit repeatedly. Have trouble breathing. 

## 2019-10-01 NOTE — ED Provider Notes (Signed)
[name] EMERGENCY DEPARTMENT Provider Note   CSN: [id number] Arrival date & time: 10/01/19  0762     History Chief Complaint  Patient presents with  . Hypertension    [name] is a 64 y.o. female with a past medical history of malignant hypertension.  Patient recently admitted to stepdown unit and started on metoprolol drip for blood pressure control.  She was [name] yesterday.  Her blood pressure was 140 systolic.  She was told to hold her medication overnight.  And only take metoprolol XL and discontinue her 2 other antihypertensives.  The patient states that she was feeling "off" this morning.  She took her blood pressure and consistently had blood pressures above 200 systolic.  She called the nurse on call who told her to take her medications and recheck it in about 2 hours.  Blood pressure was still above 180 and she came to the emergency department.  During the time she was waiting for EMS to come she began having some chest pressure.  She denies any nausea, vomiting, diaphoresis, shortness of breath, chest pain.  She was given 2 sublingual nitroglycerin without any improvement.  HPI     Past Medical History:  Diagnosis Date  . Anxiety   . Hypertension   . Iron (Fe) deficiency anemia     Patient Active Problem List   Diagnosis Date Noted  . Hypertensive urgency 09/29/2019  . Elevated troponin 09/29/2019  . Hypokalemia 09/29/2019    History reviewed. No pertinent surgical history.   OB History   No obstetric history on file.     History reviewed. No pertinent family history.  Social History   Tobacco Use  . Smoking status: Never Smoker  . Smokeless tobacco: Never Used  Substance Use Topics  . Alcohol use: Not on file  . Drug use: Not on file    Home Medications Prior to Admission medications   Medication Sig Start Date End Date Taking? Authorizing Provider  hydrALAZINE (APRESOLINE) 25 MG tablet Take 1 tablet (25 mg total) by  mouth 3 (three) times daily. 10/01/19   Arthor Captain, PA-C  metoprolol succinate (TOPROL-XL) 50 MG 24 hr tablet Take 1 tablet (50 mg total) by mouth daily. 10/01/19 10/31/19  Jae Dire, MD    Allergies    Amlodipine, Aspirin, Clonidine derivatives, Darvon [propoxyphene hcl], Lisinopril, and Valium [diazepam]  Review of Systems   Review of Systems Ten systems reviewed and are negative for acute change, except as noted in the HPI.   Physical Exam Updated Vital Signs BP (!) 172/65   Pulse 79   Temp 98.5 F (36.9 C) (Oral)   Resp 20   SpO2 98%   Physical Exam Vitals and nursing note reviewed.  Constitutional:      General: She is not in acute distress.    Appearance: She is well-developed. She is not diaphoretic.  HENT:     Head: Normocephalic and atraumatic.  Eyes:     General: No scleral icterus.    Conjunctiva/sclera: Conjunctivae normal.  Cardiovascular:     Rate and Rhythm: Normal rate and regular rhythm.     Heart sounds: Normal heart sounds. No murmur. No friction rub. No gallop.   Pulmonary:     Effort: Pulmonary effort is normal. No respiratory distress.     Breath sounds: Normal breath sounds.  Abdominal:     General: Bowel sounds are normal. There is no distension.     Palpations: Abdomen is soft. There  is no mass.     Tenderness: There is no abdominal tenderness. There is no guarding.  Musculoskeletal:     Cervical back: Normal range of motion.  Skin:    General: Skin is warm and dry.  Neurological:     Mental Status: She is alert and oriented to person, place, and time.  Psychiatric:        Behavior: Behavior normal.     ED Results / Procedures / Treatments   Labs (all labs ordered are listed, but only abnormal results are displayed) Labs Reviewed  COMPREHENSIVE METABOLIC PANEL - Abnormal; Notable for the following components:      Result Value   Glucose, Bld 102 (*)    Creatinine, Ser 1.03 (*)    GFR calc non Af Amer 57 (*)    All other  components within normal limits  URINALYSIS, ROUTINE W REFLEX MICROSCOPIC - Abnormal; Notable for the following components:   Color, Urine STRAW (*)    Specific Gravity, Urine 1.004 (*)    All other components within normal limits  CBC WITH DIFFERENTIAL/PLATELET  TROPONIN I (HIGH SENSITIVITY)    EKG EKG Interpretation  Date/Time:  Tuesday October 01 2019 09:26:42 EDT Ventricular Rate:  81 PR Interval:    QRS Duration: 96 QT Interval:  377 QTC Calculation: 438 R Axis:   44 Text Interpretation: Sinus rhythm Biatrial enlargement Left ventricular hypertrophy Baseline wander in lead(s) V6 Confirmed by Margarita Grizzle [phone]) on 10/01/2019 9:52:33 AM   Radiology ECHOCARDIOGRAM COMPLETE  Result Date: 09/29/2019    ECHOCARDIOGRAM REPORT   Patient Name:   [name] Date of Exam: 09/29/2019 Medical Rec #:  [id number]      Height:       64.0 in Accession #:    [id number]     Weight:       188.3 lb Date of Birth:  23-Feb-1956     BSA:          1.907 m Patient Age:    63 years       BP:           176/55 mmHg Patient Gender: F              HR:           89 bpm. Exam Location:  Inpatient Procedure: 2D Echo Indications:    CHF-Acute Diastolic 428.31 / I50.31  History:        Patient has no prior history of Echocardiogram examinations.                 Risk Factors:Hypertension. Elevated troponin.  Sonographer:    Leeroy Bock Turrentine Referring Phys: [id number] ESTELA Y HERNANDEZ ACOSTA IMPRESSIONS  1. Hyperdynamic LV systolic function; grade 1 diastolic dysfunction.  2. Left ventricular ejection fraction, by estimation, is 70 to 75%. The left ventricle has hyperdynamic function. The left ventricle has no regional wall motion abnormalities. Left ventricular diastolic parameters are consistent with Grade I diastolic dysfunction (impaired relaxation).  3. Right ventricular systolic function is normal. The right ventricular size is normal. There is normal pulmonary artery systolic pressure.  4. The mitral valve is normal in  structure. Trivial mitral valve regurgitation. No evidence of mitral stenosis.  5. The aortic valve is tricuspid. Aortic valve regurgitation is not visualized. No aortic stenosis is present.  6. The inferior vena cava is normal in size with greater than 50% respiratory variability, suggesting right atrial pressure of 3 mmHg. FINDINGS  Left Ventricle: Left ventricular  ejection fraction, by estimation, is 70 to 75%. The left ventricle has hyperdynamic function. The left ventricle has no regional wall motion abnormalities. The left ventricular internal cavity size was normal in size. There is no left ventricular hypertrophy. Left ventricular diastolic parameters are consistent with Grade I diastolic dysfunction (impaired relaxation). Right Ventricle: The right ventricular size is normal.Right ventricular systolic function is normal. There is normal pulmonary artery systolic pressure. The tricuspid regurgitant velocity is 2.79 m/s, and with an assumed right atrial pressure of 3 mmHg, the estimated right ventricular systolic pressure is 24.5 mmHg. Left Atrium: Left atrial size was normal in size. Right Atrium: Right atrial size was normal in size. Pericardium: There is no evidence of pericardial effusion. Mitral Valve: The mitral valve is normal in structure. Normal mobility of the mitral valve leaflets. Trivial mitral valve regurgitation. No evidence of mitral valve stenosis. Tricuspid Valve: The tricuspid valve is normal in structure. Tricuspid valve regurgitation is trivial. No evidence of tricuspid stenosis. Aortic Valve: The aortic valve is tricuspid. Aortic valve regurgitation is not visualized. No aortic stenosis is present. Pulmonic Valve: The pulmonic valve was normal in structure. Pulmonic valve regurgitation is not visualized. No evidence of pulmonic stenosis. Aorta: The aortic root is normal in size and structure. Venous: The inferior vena cava is normal in size with greater than 50% respiratory variability,  suggesting right atrial pressure of 3 mmHg.  Additional Comments: Hyperdynamic LV systolic function; grade 1 diastolic dysfunction.  LEFT VENTRICLE PLAX 2D LVIDd:         3.70 cm  Diastology LVIDs:         2.40 cm  LV e' lateral:   7.72 cm/s LV PW:         0.90 cm  LV E/e' lateral: 9.8 LV IVS:        0.90 cm  LV e' medial:    8.27 cm/s LVOT diam:     1.90 cm  LV E/e' medial:  9.2 LV SV:         61 LV SV Index:   32 LVOT Area:     2.84 cm  RIGHT VENTRICLE RV S prime:     14.00 cm/s TAPSE (M-mode): 2.3 cm LEFT ATRIUM             Index       RIGHT ATRIUM           Index LA diam:        3.30 cm 1.73 cm/m  RA Area:     11.90 cm LA Vol (A2C):   37.0 ml 19.40 ml/m RA Volume:   26.80 ml  14.06 ml/m LA Vol (A4C):   34.3 ml 17.99 ml/m LA Biplane Vol: 36.0 ml 18.88 ml/m  AORTIC VALVE LVOT Vmax:   117.00 cm/s LVOT Vmean:  79.300 cm/s LVOT VTI:    0.216 m  AORTA Ao Root diam: 2.30 cm MITRAL VALVE               TRICUSPID VALVE MV Area (PHT): 5.54 cm    TR Peak grad:   31.1 mmHg MV Decel Time: 137 msec    TR Vmax:        279.00 cm/s MV E velocity: 75.80 cm/s MV A velocity: 81.40 cm/s  SHUNTS MV E/A ratio:  0.93        Systemic VTI:  0.22 m  Systemic Diam: 1.90 cm Olga Millers MD Electronically signed by Olga Millers MD Signature Date/Time: 09/29/2019/2:48:41 PM    Final     Procedures Procedures (including critical care time)  Medications Ordered in ED Medications - No data to display  ED Course  I have reviewed the triage vital signs and the nursing notes.  Pertinent labs & imaging results that were available during my care of the patient were reviewed by me and considered in my medical decision making (see chart for details).    MDM Rules/Calculators/A&P                      Patient here with complaint of hypertension.  I personally ordered and interpreted labs which show urinalysis without significant abnormality, CMP with slightly elevated blood glucose, creatinine  improved from 1.3-1.03.  2 - troponins over time.  CBC shows no abnormalities.  EKG shows normal sinus rhythm at a rate of 81. Patient's blood pressure has improved over time here in the emergency department.  We will add her hydralazine 25 mg 3 times daily back to her regimen.  She does not have any signs of hypertensive emergency.  Do not feel that she needs a repeat admission.  Patient has outpatient follow-up.  Discussed return precautions.  She appears appropriate for discharge at this time. Final Clinical Impression(s) / ED Diagnoses Final diagnoses:  Hypertension, unspecified type    Rx / DC Orders ED Discharge Orders         Ordered    hydrALAZINE (APRESOLINE) 25 MG tablet  3 times daily     10/01/19 1315           Arthor Captain, PA-C 10/01/19 1620    Margarita Grizzle, MD 10/02/19 1156

## 2019-10-01 NOTE — Telephone Encounter (Signed)
I called pt back at 8:13 AM to check on her BP and how she was doing.  "It's higher".   "It's 200/something I can't remember but it's higher than it was".  "I've been laying down".    I instructed her to go on back to the ED.   She was agreeable and her husband is taking her now.  Reason for Disposition  [1] Systolic BP  >= 200 OR Diastolic >= 120  AND [2] having NO cardiac or neurologic symptoms    Instructed to go to the ED.  Protocols used: HIGH BLOOD PRESSURE-A-AH

## 2019-10-01 NOTE — Telephone Encounter (Signed)
Pt called in on the community line.   She went to the ED with a BP over 300 systolic on 5/30 and was admitted to the ICU at [facility].   She was discharged on 09/30/2019.   She was given a prescription for Metoprolol ER 50 mg to begin this morning.   She took her first dose at 7:00 AM.  Her BP is 198/100 at 7:00AM.   How long should she wait before going back to the ED?   I let her know by 8:00 this morning if her BP had not come down to 160/90 at least to go back to the ED.  She does not have a history of high BP, stroke or heart problems.   She does in her family but not herself.   I went over all the BEFAST symptoms with her and her husband to watch for and call 911 if any of them occur immediately.   They both verbalized understanding of these instructions.    I will call her back in an hour around 8:15 AM, and check on her.    Reason for Disposition . [1] Systolic BP  >= 180 OR Diastolic >= 110 AND [2] missed most recent dose of blood pressure medication    Just discharged from [facility] due to elevated BP.  Answer Assessment - Initial Assessment Questions 1. BLOOD PRESSURE: "What is the blood pressure?" "Did you take at least two measurements 5 minutes apart?"     198/100 7:00 AM.   I was in [place] for this went Saturday.  EMS brought me in with BP over 300/.  2. ONSET: "When did you take your blood pressure?"     This morning 7:00 AM. 3. HOW: "How did you obtain the blood pressure?" (e.g., visiting nurse, automatic home BP monitor)     Cuff at home  4. HISTORY: "Do you have a history of high blood pressure?"     No 5. MEDICATIONS: "Are you taking any medications for blood pressure?" "Have you missed any doses recently?"     No  No   Just given to me.   I started it this moring. 6. OTHER SYMPTOMS: "Do you have any symptoms?" (e.g., headache, chest pain, blurred vision, difficulty breathing, weakness)     No 7. PREGNANCY: "Is there any chance you are pregnant?"  "When was your last menstrual period?"     N/A due to age  Protocols used: HIGH BLOOD PRESSURE-A-AH

## 2019-10-01 NOTE — ED Notes (Signed)
Patient verbalizes understanding of discharge instructions. Opportunity for questioning and answers were provided. Armband removed by staff, pt discharged from ED.  

## 2019-10-01 NOTE — ED Triage Notes (Signed)
Patient arrives to ED with complaints of uncontrolled and consistent hypertension. Patient states her BP at home this more was over 280 systolic. Patient states she has had a headache since this morning. Patient received x2 nitro for chest pressure this morning.

## 2019-10-06 ENCOUNTER — Observation Stay (HOSPITAL_COMMUNITY)
Admission: EM | Admit: 2019-10-06 | Discharge: 2019-10-07 | Disposition: A | Discharge status: Home / Self Care | Payer: BC Managed Care – PPO | Attending: Internal Medicine | Admitting: Internal Medicine

## 2019-10-06 ENCOUNTER — Emergency Department (HOSPITAL_COMMUNITY): Payer: BC Managed Care – PPO

## 2019-10-06 ENCOUNTER — Encounter (HOSPITAL_COMMUNITY): Payer: Self-pay | Admitting: Internal Medicine

## 2019-10-06 ENCOUNTER — Other Ambulatory Visit: Payer: Self-pay

## 2019-10-06 DIAGNOSIS — I110 Hypertensive heart disease with heart failure: Secondary | ICD-10-CM | POA: Diagnosis not present

## 2019-10-06 DIAGNOSIS — G4489 Other headache syndrome: Secondary | ICD-10-CM | POA: Diagnosis not present

## 2019-10-06 DIAGNOSIS — Z888 Allergy status to other drugs, medicaments and biological substances status: Secondary | ICD-10-CM | POA: Insufficient documentation

## 2019-10-06 DIAGNOSIS — I5032 Chronic diastolic (congestive) heart failure: Secondary | ICD-10-CM | POA: Diagnosis not present

## 2019-10-06 DIAGNOSIS — R739 Hyperglycemia, unspecified: Secondary | ICD-10-CM | POA: Insufficient documentation

## 2019-10-06 DIAGNOSIS — Z6832 Body mass index (BMI) 32.0-32.9, adult: Secondary | ICD-10-CM | POA: Insufficient documentation

## 2019-10-06 DIAGNOSIS — I160 Hypertensive urgency: Secondary | ICD-10-CM | POA: Diagnosis not present

## 2019-10-06 DIAGNOSIS — I119 Hypertensive heart disease without heart failure: Secondary | ICD-10-CM | POA: Diagnosis present

## 2019-10-06 DIAGNOSIS — R29810 Facial weakness: Secondary | ICD-10-CM | POA: Diagnosis not present

## 2019-10-06 DIAGNOSIS — Z79899 Other long term (current) drug therapy: Secondary | ICD-10-CM | POA: Insufficient documentation

## 2019-10-06 DIAGNOSIS — Z20822 Contact with and (suspected) exposure to covid-19: Secondary | ICD-10-CM | POA: Insufficient documentation

## 2019-10-06 DIAGNOSIS — I169 Hypertensive crisis, unspecified: Secondary | ICD-10-CM | POA: Diagnosis not present

## 2019-10-06 DIAGNOSIS — R531 Weakness: Secondary | ICD-10-CM | POA: Diagnosis not present

## 2019-10-06 DIAGNOSIS — Z886 Allergy status to analgesic agent status: Secondary | ICD-10-CM | POA: Insufficient documentation

## 2019-10-06 DIAGNOSIS — R42 Dizziness and giddiness: Secondary | ICD-10-CM | POA: Diagnosis not present

## 2019-10-06 DIAGNOSIS — E669 Obesity, unspecified: Secondary | ICD-10-CM | POA: Diagnosis not present

## 2019-10-06 DIAGNOSIS — R0602 Shortness of breath: Secondary | ICD-10-CM | POA: Diagnosis not present

## 2019-10-06 LAB — COMPREHENSIVE METABOLIC PANEL
ALT: 12 U/L (ref 0–44)
AST: 23 U/L (ref 15–41)
Albumin: 3.7 g/dL (ref 3.5–5.0)
Alkaline Phosphatase: 90 U/L (ref 38–126)
Anion gap: 13 (ref 5–15)
BUN: 8 mg/dL (ref 8–23)
CO2: 22 mmol/L (ref 22–32)
Calcium: 9.3 mg/dL (ref 8.9–10.3)
Chloride: 105 mmol/L (ref 98–111)
Creatinine, Ser: 0.97 mg/dL (ref 0.44–1.00)
GFR calc Af Amer: >60 mL/min (ref >60)
GFR calc non Af Amer: >60 mL/min (ref >60)
Glucose, Bld: 102 mg/dL — ABNORMAL HIGH (ref 70–99)
Potassium: 4.1 mmol/L (ref 3.5–5.1)
Sodium: 140 mmol/L (ref 135–145)
Total Bilirubin: 1 mg/dL (ref 0.3–1.2)
Total Protein: 6.8 g/dL (ref 6.5–8.1)

## 2019-10-06 LAB — CBC WITH DIFFERENTIAL/PLATELET
Abs Immature Granulocytes: 0.02 10*3/uL (ref 0.00–0.07)
Basophils Absolute: 0 10*3/uL (ref 0.0–0.1)
Basophils Relative: 0 %
Eosinophils Absolute: 0.1 10*3/uL (ref 0.0–0.5)
Eosinophils Relative: 2 %
HCT: 43.4 % (ref 36.0–46.0)
Hemoglobin: 14.3 g/dL (ref 12.0–15.0)
Immature Granulocytes: 0 %
Lymphocytes Relative: 26 %
Lymphs Abs: 1.7 10*3/uL (ref 0.7–4.0)
MCH: 28.9 pg (ref 26.0–34.0)
MCHC: 32.9 g/dL (ref 30.0–36.0)
MCV: 87.7 fL (ref 80.0–100.0)
Monocytes Absolute: 0.5 10*3/uL (ref 0.1–1.0)
Monocytes Relative: 7 %
Neutro Abs: 4.2 10*3/uL (ref 1.7–7.7)
Neutrophils Relative %: 65 %
Platelets: 297 10*3/uL (ref 150–400)
RBC: 4.95 MIL/uL (ref 3.87–5.11)
RDW: 12.1 % (ref 11.5–15.5)
WBC: 6.5 10*3/uL (ref 4.0–10.5)
nRBC: 0 % (ref 0.0–0.2)

## 2019-10-06 LAB — BRAIN NATRIURETIC PEPTIDE
B Natriuretic Peptide: 42.4 pg/mL (ref 0.0–100.0)
B Natriuretic Peptide: 37.7 pg/mL (ref 0.0–100.0)

## 2019-10-06 LAB — TROPONIN I (HIGH SENSITIVITY)
Troponin I (High Sensitivity): 30 ng/L — ABNORMAL HIGH (ref <18)
Troponin I (High Sensitivity): 27 ng/L — ABNORMAL HIGH (ref <18)

## 2019-10-06 LAB — SARS CORONAVIRUS 2 BY RT PCR (HOSPITAL ORDER, PERFORMED IN ~~LOC~~ HOSPITAL LAB): SARS Coronavirus 2: NEGATIVE

## 2019-10-06 LAB — HEMOGLOBIN A1C
Hgb A1c MFr Bld: 4.9 % (ref 4.8–5.6)
Mean Plasma Glucose: 93.93 mg/dL

## 2019-10-06 LAB — CORTISOL-AM, BLOOD: Cortisol - AM: 7.2 ug/dL (ref 6.7–22.6)

## 2019-10-06 MED ORDER — SODIUM CHLORIDE 0.9% FLUSH
3.0000 mL | Freq: Two times a day (BID) | INTRAVENOUS | Status: DC
  Administered 2019-10-06 – 2019-10-07 (×2): 3 mL via INTRAVENOUS

## 2019-10-06 MED ORDER — HYDRALAZINE HCL 50 MG PO TABS
50.0000 mg | ORAL_TABLET | Freq: Three times a day (TID) | ORAL | Status: DC
  Administered 2019-10-06 – 2019-10-07 (×3): 50 mg via ORAL
  Filled 2019-10-06 (×3): qty 1

## 2019-10-06 MED ORDER — ACETAMINOPHEN 650 MG RE SUPP: 650.0000 mg | Freq: Four times a day (QID) | RECTAL | Status: DC | PRN

## 2019-10-06 MED ORDER — LABETALOL HCL 5 MG/ML IV SOLN
20.0000 mg | Freq: Once | INTRAVENOUS | Status: AC
  Administered 2019-10-06: 20 mg via INTRAVENOUS
  Filled 2019-10-06: qty 4

## 2019-10-06 MED ORDER — ONDANSETRON HCL 4 MG/2ML IJ SOLN
4.0000 mg | Freq: Four times a day (QID) | INTRAMUSCULAR | Status: DC | PRN
  Filled 2019-10-06: qty 2

## 2019-10-06 MED ORDER — ENOXAPARIN SODIUM 40 MG/0.4ML ~~LOC~~ SOLN
40.0000 mg | SUBCUTANEOUS | Status: DC
  Administered 2019-10-06: 40 mg via SUBCUTANEOUS
  Filled 2019-10-06: qty 0.4

## 2019-10-06 MED ORDER — LABETALOL HCL 5 MG/ML IV SOLN
10.0000 mg | Freq: Once | INTRAVENOUS | Status: AC
  Administered 2019-10-06: 10 mg via INTRAVENOUS
  Filled 2019-10-06: qty 4

## 2019-10-06 MED ORDER — ACETAMINOPHEN 325 MG PO TABS: 650.0000 mg | ORAL_TABLET | Freq: Four times a day (QID) | ORAL | Status: DC | PRN

## 2019-10-06 MED ORDER — ONDANSETRON HCL 4 MG PO TABS: 4.0000 mg | ORAL_TABLET | Freq: Four times a day (QID) | ORAL | Status: DC | PRN

## 2019-10-06 MED ORDER — LABETALOL HCL 100 MG PO TABS
100.0000 mg | ORAL_TABLET | Freq: Two times a day (BID) | ORAL | Status: DC
  Administered 2019-10-06 – 2019-10-07 (×2): 100 mg via ORAL
  Filled 2019-10-06 (×2): qty 1

## 2019-10-06 NOTE — H&P (Signed)
History and Physical    [name] YYT:[id number] DOB: 08-16-1955 DOA: 10/06/2019  PCP: Patient, No Pcp Per - appt on 6/10 with [facility]:  None Patient coming from:  Home - lives with husband; NOK: Husband, [phone]  Chief Complaint: Uncontrolled HTN  HPI: [name] is a 64 y.o. female with medical history significant of HTN and chronic diastolic CHF presenting with uncontrolled HTN.  She was previously admitted to our service from 5/30-31 for the same issue, having stopped taking her antihypertensives several months ago.  She reports that her BP hasn't ben stable since last Friday.  This is her 5th trip to the ER.  She was admitted to [facility] ICU and was discharged the next afternoon.  Her BP was in the 150-170 range at that time.  She went back to the ER one time in the interim.  Every day since then, she has checked her BP frequently with significant lability.  When it got to 200, she would rest in a quiet room.  Last night, it was 200+/ again.  Her heart was beating very fast.  She felt light-headed and dizzy.  She laid down and tried to get it back down.  This AM, it was 248/.  With EMS, it was "over 300, it was off their meter."  She doesn't think the medication are helping at all, has taken all of her doses of medications.  +Headache.  Her vision was somewhat blurry.  She had BUE tingling/numbness in her hands last night.  She feels very tired.   ED Course:  Uncontrolled HTN.  Admitted for similar and released on 5/31.  She is taking her medications and checking BPs multiple times a day.  Given Labetalol with great response.  Likely needs more meds.  Review of Systems: As per HPI; otherwise review of systems reviewed and negative.   Ambulatory Status:  Ambulates without assistance  COVID Vaccine Status:  None  Past Medical History:  Diagnosis Date  . Anxiety   . Hypertension   . Iron (Fe) deficiency anemia     Past Surgical History:  Procedure  Laterality Date  . CESAREAN SECTION      Social History   Socioeconomic History  . Marital status: Married    Spouse name: Not on file  . Number of children: Not on file  . Years of education: Not on file  . Highest education level: Not on file  Occupational History  . Occupation: retired  Tobacco Use  . Smoking status: Never Smoker  . Smokeless tobacco: Never Used  Substance and Sexual Activity  . Alcohol use: Not Currently    Comment: rare  . Drug use: Never  . Sexual activity: Not on file  Other Topics Concern  . Not on file  Social History Narrative  . Not on file   Social Determinants of Health   Financial Resource Strain:   . Difficulty of Paying Living Expenses:   Food Insecurity:   . Worried About Programme researcher, broadcasting/film/video in the Last Year:   . Barista in the Last Year:   Transportation Needs:   . Freight forwarder (Medical):   Marland Kitchen Lack of Transportation (Non-Medical):   Physical Activity:   . Days of Exercise per Week:   . Minutes of Exercise per Session:   Stress:   . Feeling of Stress :   Social Connections:   . Frequency of Communication with Friends and Family:   .  Frequency of Social Gatherings with Friends and Family:   . Attends Religious Services:   . Active Member of Clubs or Organizations:   . Attends Archivist Meetings:   Marland Kitchen Marital Status:   Intimate Partner Violence:   . Fear of Current or Ex-Partner:   . Emotionally Abused:   Marland Kitchen Physically Abused:   . Sexually Abused:     Allergies  Allergen Reactions  . Amlodipine     Dental issues   . Aspirin Hives  . Clonidine Derivatives Other (See Comments)    unknown  . Darvon [Propoxyphene Hcl] Other (See Comments)    Dizziness  . Lisinopril Other (See Comments)    gout  . Toprol Xl [Metoprolol] Other (See Comments)    nightmares  . Valium [Diazepam] Other (See Comments)    Dizziness    Family History  Problem Relation Age of Onset  . CVA Mother   . Hypertension  Mother   . Hypertension Sister   . Hypertension Brother   . Hypertension Maternal Grandmother   . Kidney failure Neg Hx     Prior to Admission medications   Medication Sig Start Date End Date Taking? Authorizing Provider  hydrALAZINE (APRESOLINE) 25 MG tablet Take 1 tablet (25 mg total) by mouth 3 (three) times daily. 10/01/19  Yes Harris, Abigail, PA-C  metoprolol succinate (TOPROL-XL) 50 MG 24 hr tablet Take 1 tablet (50 mg total) by mouth daily. 10/01/19 10/31/19 Yes Harold Hedge, MD    Physical Exam: Vitals:   10/06/19 1515 10/06/19 1530 10/06/19 1545 10/06/19 1600  BP: (!) 163/70 (!) 166/68 (!) 172/79 (!) 169/74  Pulse: 85     Resp: 12 19 15 18   Temp:      SpO2: 97%     Weight:      Height:         . General:  Appears calm and comfortable and is NAD . Eyes:  PERRL, EOMI, normal lids, iris . ENT:  grossly normal hearing, lips & tongue, mmm; appropriate dentition . Neck:  no LAD, masses or thyromegaly . Cardiovascular:  RRR, no m/r/g. Mild ankle edema.  Marland Kitchen Respiratory:   CTA bilaterally with no wheezes/rales/rhonchi.  Normal respiratory effort. . Abdomen:  soft, NT, ND, NABS . Skin:  no rash or induration seen on limited exam . Musculoskeletal:  grossly normal tone BUE/BLE, good ROM, no bony abnormality . Lower extremity:  No LE edema.  Limited foot exam with no ulcerations.  2+ distal pulses. Marland Kitchen Psychiatric:  grossly normal mood and affect, speech fluent and appropriate, AOx3 . Neurologic:  CN 2-12 grossly intact, moves all extremities in coordinated fashion    Radiological Exams on Admission: DG Chest Portable 1 View  Result Date: 10/06/2019 CLINICAL DATA:  Dyspnea, hypertension, headache, dizziness, shortness of breath, weakness EXAM: PORTABLE CHEST 1 VIEW COMPARISON:  Portable exam 1011 hours compared to 09/29/2019 FINDINGS: Normal heart size, mediastinal contours, and pulmonary vascularity. Numerous EKG leads project over chest. Lungs clear. No infiltrate, pleural  effusion or pneumothorax. Osseous structures unremarkable. IMPRESSION: No acute abnormalities. Electronically Signed   By: Lavonia Dana M.D.   On: 10/06/2019 10:30    EKG: Independently reviewed.  NSR with rate 134; LAE/LVH with no evidence of acute ischemia   Labs on Admission: I have personally reviewed the available labs and imaging studies at the time of the admission.  Pertinent labs:   Glucose 102 BNP 37.7 HS troponin 30 Normal CBC   Assessment/Plan Principal Problem:  Hypertensive crisis without congestive heart failure Active Problems:   Chronic diastolic CHF (congestive heart failure) (HCC)   Hypertensive crisis with headache -Patient presenting with very poorly controlled HTN (despite now taking medications) and headache, concerning for hypertensive crisis -She did have evidence of end organ failure, with neurologic symptoms including headache and blurry vision -The initial goal of therapy would generally be to decrease the MAP by no more than 25% in the first hour and then continue to decrease the BP additionally within the next 2-6 hours -The patient received Labetalol IV x 1 in the ER with subsequent significant decrease in BP without apparent difficulty -Based on his BP control no longer in a dangerous range, will observe patient on progressive rather than placing in ICU -Will cycle troponin (mild elevation x 1); low suspicion for ACS -Patient and her husband report strict compliance with medication and marked BP lability, raising concern for secondary HTN -Will order renal US with duplex as well as renin:aldosterone; fasting cortisol; and urinary metanephrines; recent normal TSH -Will continue Hydralazine but increase to 50 mg TID -Will substitute labetalol for Toprol XL -Will add prn IV hydralazine -Consider addition of chlorthalidone in AM, if additional BP medication is needed -She has reported allergies to Norvasc (dental issues); ASA (hives); Clonidine (?);  Lisinopril (gout); Toprol XL (nightmares) - which complicates her treatment regimen  Chronic diastolic CHF -5/30 echo with preserved EF (actually hyperdynamic) and grade 1 diastolic dysfunction -She appears to be compensated at this time  Hypergylcemia -Very minimally elevated but this was also the case during her prior hospitalization -Will check A1c   Note: This patient has been tested and is negative for the novel coronavirus COVID-19.  DVT prophylaxis:  Lovenox  Code Status:  Full - confirmed with patient/family Family Communication: Husband was present throughout evaluation Disposition Plan:  The patient is from: home  Anticipated d/c is to: home without [facility] services  Anticipated d/c date will depend on clinical response to treatment, but possibly as early as tomorrow if she has excellent response to treatment  Patient is currently: acutely ill Consults called: None Admission status:  It is my clinical opinion that referral for OBSERVATION is reasonable and necessary in this patient based on the above information provided. The aforementioned taken together are felt to place the patient at high risk for further clinical deterioration. However it is anticipated that the patient may be medically stable for discharge from the hospital within 24 to 48 hours.    Jonah Blue MD [facility]   How to contact the [facility] Attending or Consulting provider 7A - 7P or covering provider during after hours 7P -7A, for this patient?  1. Check the care team in [facility] and look for a) attending/consulting TR[facility] provider listed and b) the [facility] team listed 2. Log into [email or web address] and use [facility]'s universal password to access. If you do not have the password, please contact the hospital operator. 3. Locate the [facility] provider you are looking for under [facility] and page to a number that you can be directly reached. 4. If you still have difficulty reaching the provider, please page the [facility]  (Director on Call) for the Hospitalists listed on amion for assistance.   10/06/2019, 4:56 PM

## 2019-10-06 NOTE — Progress Notes (Signed)
Informed that patient needs to be NPO after midnight for renal US tomorrow morning. Informed patient, will inform night shift as well.

## 2019-10-06 NOTE — ED Provider Notes (Signed)
[facility] EMERGENCY DEPARTMENT Provider Note   CSN: [id number] Arrival date & time: 10/06/19  7425     History Chief Complaint  Patient presents with  . Hypertension    April [name] is a 64 y.o. female.  HPI 65 year old female presents with uncontrolled hypertension.  Was admitted at the end of May for uncontrolled hypertension and discharged home on 5/31.  Came back to the emergency department on 6/1 due to chest pain and poorly controlled blood pressure.  Hydralazine was added at that time.  She has been taking the 3 times daily hydralazine and the once per day metoprolol.  Last took this morning.  Blood pressure has been up and down, including up to 260 systolic.  EMS was reporting blood pressures over 300.  She has been feeling waxing and waning left-sided headaches (the same as last time) as well as lightheadedness and shortness of breath.  A little bit of blurry vision. No chest pain. No double vision or weakness/numbness.   Past Medical History:  Diagnosis Date  . Anxiety   . Hypertension   . Iron (Fe) deficiency anemia     Patient Active Problem List   Diagnosis Date Noted  . Hypertensive urgency 09/29/2019  . Elevated troponin 09/29/2019  . Hypokalemia 09/29/2019    No past surgical history on file.   OB History   No obstetric history on file.     No family history on file.  Social History   Tobacco Use  . Smoking status: Never Smoker  . Smokeless tobacco: Never Used  Substance Use Topics  . Alcohol use: Not on file  . Drug use: Not on file    Home Medications Prior to Admission medications   Medication Sig Start Date End Date Taking? Authorizing Provider  hydrALAZINE (APRESOLINE) 25 MG tablet Take 1 tablet (25 mg total) by mouth 3 (three) times daily. 10/01/19  Yes Harris, Abigail, PA-C  metoprolol succinate (TOPROL-XL) 50 MG 24 hr tablet Take 1 tablet (50 mg total) by mouth daily. 10/01/19 10/31/19 Yes Jae Dire, MD     Allergies    Amlodipine, Aspirin, Clonidine derivatives, Darvon [propoxyphene hcl], Lisinopril, Toprol xl [metoprolol], and Valium [diazepam]  Review of Systems   Review of Systems  Constitutional: Negative for fever.  Eyes: Positive for visual disturbance.  Respiratory: Positive for shortness of breath.   Cardiovascular: Negative for chest pain.  Neurological: Positive for dizziness, light-headedness and headaches. Negative for weakness and numbness.  All other systems reviewed and are negative.   Physical Exam Updated Vital Signs BP (!) 192/73   Pulse 100   Temp 98.9 F (37.2 C)   Resp 18   Ht 5\' 4"  (1.626 m)   Wt 59.9 kg   SpO2 98%   BMI 22.66 kg/m   Physical Exam Vitals and nursing note reviewed.  Constitutional:      Appearance: She is well-developed. She is not ill-appearing or diaphoretic.  HENT:     Head: Normocephalic and atraumatic.     Right Ear: External ear normal.     Left Ear: External ear normal.     Nose: Nose normal.  Eyes:     General:        Right eye: No discharge.        Left eye: No discharge.  Cardiovascular:     Rate and Rhythm: Regular rhythm. Tachycardia present.     Heart sounds: Normal heart sounds.  Pulmonary:     Effort: Pulmonary  effort is normal.     Breath sounds: Normal breath sounds.  Abdominal:     Palpations: Abdomen is soft.     Tenderness: There is no abdominal tenderness.  Skin:    General: Skin is warm and dry.  Neurological:     Mental Status: She is alert.     Comments: CN 3-12 grossly intact. 5/5 strength in all 4 extremities. Grossly normal sensation. Normal finger to nose.   Psychiatric:        Mood and Affect: Mood is not anxious.     ED Results / Procedures / Treatments   Labs (all labs ordered are listed, but only abnormal results are displayed) Labs Reviewed  COMPREHENSIVE METABOLIC PANEL - Abnormal; Notable for the following components:      Result Value   Glucose, Bld 102 (*)    All other  components within normal limits  TROPONIN I (HIGH SENSITIVITY) - Abnormal; Notable for the following components:   Troponin I (High Sensitivity) 30 (*)    All other components within normal limits  SARS CORONAVIRUS 2 BY RT PCR (HOSPITAL ORDER, [facility])  BRAIN NATRIURETIC PEPTIDE  CBC WITH DIFFERENTIAL/PLATELET  BRAIN NATRIURETIC PEPTIDE  TROPONIN I (HIGH SENSITIVITY)    EKG EKG Interpretation  Date/Time:  Sunday October 06 2019 09:42:53 EDT Ventricular Rate:  134 PR Interval:    QRS Duration: 85 QT Interval:  318 QTC Calculation: 475 R Axis:   15 Text Interpretation: Sinus tachycardia LAE, consider biatrial enlargement Probable LVH with secondary repol abnrm rate is faster compared earlier in the day Confirmed by Sherwood Gambler [phone]) on 10/06/2019 10:10:55 AM   Radiology DG Chest Portable 1 View  Result Date: 10/06/2019 CLINICAL DATA:  Dyspnea, hypertension, headache, dizziness, shortness of breath, weakness EXAM: PORTABLE CHEST 1 VIEW COMPARISON:  Portable exam 1011 hours compared to 09/29/2019 FINDINGS: Normal heart size, mediastinal contours, and pulmonary vascularity. Numerous EKG leads project over chest. Lungs clear. No infiltrate, pleural effusion or pneumothorax. Osseous structures unremarkable. IMPRESSION: No acute abnormalities. Electronically Signed   By: Lavonia Dana M.D.   On: 10/06/2019 10:30    Procedures Ultrasound ED Peripheral IV (Provider)  Date/Time: 10/06/2019 11:02 AM Performed by: Sherwood Gambler, MD Authorized by: Sherwood Gambler, MD   Procedure details:    Indications: multiple failed IV attempts and poor IV access     Skin Prep: chlorhexidine gluconate     Location:  Left AC   Angiocath:  20 G   Bedside Ultrasound Guided: Yes     Images: not archived     Patient tolerated procedure without complications: Yes     Dressing applied: Yes    .Critical Care Performed by: Sherwood Gambler, MD Authorized by: Sherwood Gambler, MD    Critical care provider statement:    Critical care time (minutes):  35   Critical care time was exclusive of:  Separately billable procedures and treating other patients   Critical care was necessary to treat or prevent imminent or life-threatening deterioration of the following conditions:  Cardiac failure, CNS failure or compromise and circulatory failure   Critical care was time spent personally by me on the following activities:  Discussions with consultants, evaluation of patient's response to treatment, examination of patient, ordering and performing treatments and interventions, ordering and review of laboratory studies, ordering and review of radiographic studies, pulse oximetry, re-evaluation of patient's condition, obtaining history from patient or surrogate and review of old charts   (including critical care time)  Medications Ordered in ED Medications  labetalol (NORMODYNE) injection 20 mg (20 mg Intravenous Given 10/06/19 1009)  labetalol (NORMODYNE) injection 10 mg (10 mg Intravenous Given 10/06/19 1504)    ED Course  I have reviewed the triage vital signs and the nursing notes.  Pertinent labs & imaging results that were available during my care of the patient were reviewed by me and considered in my medical decision making (see chart for details).    MDM Rules/Calculators/A&P                      Patient has uncontrolled hypertension.  She did respond well with IV labetalol and her map reduced by about 25%.  Now starting to creep back up.  She remains symptomatic.  Will give another smaller dose of labetalol.  I think she will need admission for further hypertension control.  She does have headache but this has been ongoing and similar to when she had hypertension last week.  I do not think she has acute CNS emergency and do not think CT is needed based on normal neuro exam.  Discussed with Dr. Ophelia Charter for admission. Final Clinical Impression(s) / ED Diagnoses Final diagnoses:   Hypertensive urgency    Rx / DC Orders ED Discharge Orders    None       Pricilla Loveless, MD 10/06/19 1528

## 2019-10-06 NOTE — ED Triage Notes (Signed)
Pt arrives via EMS from home with compliants of inability to control blood pressure. Symptoms included headache, dizziness, SOB and weakness.EMS reports systolic over 300.   HR 129 ST 98% RA 17 RR

## 2019-10-07 ENCOUNTER — Observation Stay (HOSPITAL_BASED_OUTPATIENT_CLINIC_OR_DEPARTMENT_OTHER): Payer: BC Managed Care – PPO

## 2019-10-07 DIAGNOSIS — I5032 Chronic diastolic (congestive) heart failure: Secondary | ICD-10-CM

## 2019-10-07 DIAGNOSIS — I169 Hypertensive crisis, unspecified: Secondary | ICD-10-CM

## 2019-10-07 LAB — CBC
HCT: 37.5 % (ref 36.0–46.0)
Hemoglobin: 12.5 g/dL (ref 12.0–15.0)
MCH: 29.7 pg (ref 26.0–34.0)
MCHC: 33.3 g/dL (ref 30.0–36.0)
MCV: 89.1 fL (ref 80.0–100.0)
Platelets: 267 10*3/uL (ref 150–400)
RBC: 4.21 MIL/uL (ref 3.87–5.11)
RDW: 12.2 % (ref 11.5–15.5)
WBC: 4.9 10*3/uL (ref 4.0–10.5)
nRBC: 0 % (ref 0.0–0.2)

## 2019-10-07 LAB — BASIC METABOLIC PANEL
Anion gap: 12 (ref 5–15)
BUN: 9 mg/dL (ref 8–23)
CO2: 23 mmol/L (ref 22–32)
Calcium: 9.1 mg/dL (ref 8.9–10.3)
Chloride: 103 mmol/L (ref 98–111)
Creatinine, Ser: 1.03 mg/dL — ABNORMAL HIGH (ref 0.44–1.00)
GFR calc Af Amer: >60 mL/min (ref >60)
GFR calc non Af Amer: 57 mL/min — ABNORMAL LOW (ref >60)
Glucose, Bld: 103 mg/dL — ABNORMAL HIGH (ref 70–99)
Potassium: 3.5 mmol/L (ref 3.5–5.1)
Sodium: 138 mmol/L (ref 135–145)

## 2019-10-07 MED ORDER — HYDRALAZINE HCL 50 MG PO TABS
50.0000 mg | ORAL_TABLET | Freq: Three times a day (TID) | ORAL | 0 refills | Status: DC
Start: 2019-10-07 — End: 2019-10-10

## 2019-10-07 MED ORDER — LABETALOL HCL 100 MG PO TABS: 100.0000 mg | ORAL_TABLET | Freq: Two times a day (BID) | ORAL | 0 refills | Status: DC

## 2019-10-07 NOTE — Progress Notes (Signed)
Patient was discharged home by MD order; discharged instructions  reviewed and given to patient with care notes; IV DIC; skin intact; patient will be escorted to the car by nurse tech via wheelchair.  

## 2019-10-07 NOTE — Progress Notes (Signed)
Renal artery duplex       has been completed. Preliminary results can be found under CV proc through chart review. Gisell Buehrle, BS, RDMS, RVT   

## 2019-10-07 NOTE — Discharge Summary (Signed)
Physician Discharge Summary  April [name]:[id number] DOB: [birth year]  PCP: Patient, No Pcp Per  Admitted from: Home Discharged to: Home  Admit date: 10/06/2019 Discharge date: 10/07/2019  Recommendations for Outpatient Follow-up:   Follow-up Information    [facility] Follow up on 10/10/2019.   Why: Keep the prior appointment you have at 9:30 AM.  Kindly follow outstanding lab work that were sent from the hospital (urine metanephrines, renin and aldosterone activity with ratio). Contact information: [address] [place] [address] [phone]           Home Health: None Equipment/Devices: None  Discharge Condition: Improved and stable CODE STATUS: Full Diet recommendation: Heart healthy/low-salt diet.  Discharge Diagnoses:  Principal Problem:   Hypertensive crisis without congestive heart failure Active Problems:   Chronic diastolic CHF (congestive heart failure) [facility])   Brief Summary: 64 year old married female with PMH of hypertension, chronic diastolic CHF and obesity presented to the [facility] ED on 10/06/2019 due to uncontrolled hypertension.  She was previously hospitalized from 5/30-5/31 for the same issue, having stopped taking her antihypertensives several months prior.  She reported that her blood pressures had not been stable for 2 days prior to this visit.  This was her fifth trip to the ER.  She checks her blood pressure frequently and noted significant lability.  Night prior to this admission, it was in the 200 range with associated palpitations, lightheadedness, dizziness.  On the morning of admission it was SBP of 248 and reportedly with EMS "over 300".  She claims compliance with her antihypertensive medications.  She had some headache and her vision was blurry.  She reported bilateral upper extremity tingling/numbness.  Felt tired.  She was admitted for hypertensive crisis.  Assessment and  plan:  Hypertensive crisis/emergency:  She presented with poorly controlled hypertension now despite compliance with her antihypertensives.  She had associated symptoms as noted above.  She received a couple doses of as needed IV labetalol in the ED.  She was then placed on increased dose of hydralazine 50 mg 3 times daily.  Toprol-XL was discontinued due to reported nightmares.  She was started on labetalol 100 mg twice daily.  With these measures blood pressure control has significantly improved.  Although this morning it was 180/70, that was prior to taking her medications.  Since then her BP was 120/55.  She has now been asymptomatic since last night.  No headache, visual symptoms, dizziness, lightheadedness, strokelike symptoms, chest pain or dyspnea.  Work-up for secondary causes was initiated.  A.m. cortisol: 7.2.  A1c 4.9.  Urine metanephrines, renin aldosterone activity and ratio have been drawn and pending and must be followed as outpatient during PCP visit.  BNP was normal.  Troponin minimally elevated with a flat trend, 30 > 27 suggestive of demand ischemia related to uncontrolled hypertension.  Renal artery Doppler results are as noted below.  I reviewed these results in detail with the reading vascular surgeon who advised that she did not have significant renal artery stenosis, likely has medical renal disease, okay to use ACEI/ARB if needed.  I have updated patient regarding these results.  She has an upcoming follow-up appointment with her PCP on 6/10 and she is advised to keep that appointment.  Improved.  TSH 1.006 on 5/30.  I did a funduscopic exam at bedside.  No papilledema noted.  She may have some features of early hypertensive retinopathy.  She reports that she was recently seen by her  ophthalmologist and has regular visits with him-advised to continue.  As outpatient may consider titrating up labetalol or adding a thiazide diuretic if blood pressures need further control.  Chronic  diastolic CHF: TTE 09/29/2019: LVEF 70-75% and grade 1 diastolic dysfunction.  Compensated without features of volume overload.  Hyperglycemia: Not diabetic.  A1c 4.9.  Obesity/Body mass index is 32.3 kg/m. Advise diet, exercise and weight loss which should also help reducing her blood pressure.   Consultations:  None  Procedures:  None   Discharge Instructions  Discharge Instructions    Call MD for:  difficulty breathing, headache or visual disturbances   Complete by: As directed    Call MD for:  extreme fatigue   Complete by: As directed    Call MD for:  persistant dizziness or light-headedness   Complete by: As directed    Call MD for:  severe uncontrolled pain   Complete by: As directed    Diet - low sodium heart healthy   Complete by: As directed    Increase activity slowly   Complete by: As directed        Medication List    STOP taking these medications   metoprolol succinate 50 MG 24 hr tablet Commonly known as: TOPROL-XL     TAKE these medications   hydrALAZINE 50 MG tablet Commonly known as: APRESOLINE Take 1 tablet (50 mg total) by mouth 3 (three) times daily. What changed:   medication strength  how much to take   labetalol 100 MG tablet Commonly known as: NORMODYNE Take 1 tablet (100 mg total) by mouth 2 (two) times daily.      Allergies  Allergen Reactions  . Amlodipine     Dental issues   . Aspirin Hives  . Clonidine Derivatives Other (See Comments)    unknown  . Darvon [Propoxyphene Hcl] Other (See Comments)    Dizziness  . Lisinopril Other (See Comments)    gout  . Toprol Xl [Metoprolol] Other (See Comments)    nightmares  . Valium [Diazepam] Other (See Comments)    Dizziness      Procedures/Studies:  DG Chest Portable 1 View  Result Date: 10/06/2019 CLINICAL DATA:  Dyspnea, hypertension, headache, dizziness, shortness of breath, weakness EXAM: PORTABLE CHEST 1 VIEW COMPARISON:  Portable exam 1011 hours compared to  09/29/2019 FINDINGS: Normal heart size, mediastinal contours, and pulmonary vascularity. Numerous EKG leads project over chest. Lungs clear. No infiltrate, pleural effusion or pneumothorax. Osseous structures unremarkable. IMPRESSION: No acute abnormalities. Electronically Signed   By: Ulyses SouthwardMark  Boles M.D.   On: 10/06/2019 10:30    VAS US RENAL ARTERY DUPLEX  Result Date: 10/07/2019 ABDOMINAL VISCERAL High Risk Factors: Hypertension. Performing Technologist: Jeb LeveringJill Parker RDMS, RVT  Examination Guidelines: A complete evaluation includes B-mode imaging, spectral Doppler, color Doppler, and power Doppler as needed of all accessible portions of each vessel. Bilateral testing is considered an integral part of a complete examination. Limited examinations for reoccurring indications may be performed as noted.  Duplex Findings: +--------------------+--------+--------+------+--------+ Mesenteric          PSV cm/sEDV cm/sPlaqueComments +--------------------+--------+--------+------+--------+ Aorta at SMA          140      13                  +--------------------+--------+--------+------+--------+ Celiac Artery Origin  262      37                  +--------------------+--------+--------+------+--------+ SMA Proximal  266      39                  +--------------------+--------+--------+------+--------+    +------------------+--------+--------+-------+ Right Renal ArteryPSV cm/sEDV cm/sComment +------------------+--------+--------+-------+ Origin              155      57           +------------------+--------+--------+-------+ Proximal            141      42           +------------------+--------+--------+-------+ Mid                 163      37           +------------------+--------+--------+-------+ Distal              109      20           +------------------+--------+--------+-------+ +-----------------+--------+--------+-------+ Left Renal ArteryPSV cm/sEDV  cm/sComment +-----------------+--------+--------+-------+ Origin             103      32           +-----------------+--------+--------+-------+ Proximal           117      47           +-----------------+--------+--------+-------+ Mid                141      19           +-----------------+--------+--------+-------+ Distal              81      15           +-----------------+--------+--------+-------+ +------------+--------+--------+----+-----------+--------+--------+----+ Right KidneyPSV cm/sEDV cm/sRI  Left KidneyPSV cm/sEDV cm/sRI   +------------+--------+--------+----+-----------+--------+--------+----+ Upper Pole  35      7       0.80Upper Pole 67      14      0.80 +------------+--------+--------+----+-----------+--------+--------+----+ Mid         36      7       0.        62      14      0.78 +------------+--------+--------+----+-----------+--------+--------+----+ Lower Pole  23      6       0.75Lower Pole 21      9       0.55 +------------+--------+--------+----+-----------+--------+--------+----+ Hilar       69      13      0.82Hilar      117     16      0.86 +------------+--------+--------+----+-----------+--------+--------+----+ +------------------+-----+------------------+-----+ Right Kidney           Left Kidney             +------------------+-----+------------------+-----+ RAR                    RAR                     +------------------+-----+------------------+-----+ RAR (manual)           RAR (manual)            +------------------+-----+------------------+-----+ Cortex                 Cortex                  +------------------+-----+------------------+-----+ Cortex thickness       Corex thickness         +------------------+-----+------------------+-----+  Kidney length (cm)11.35Kidney length (cm)10.84 +------------------+-----+------------------+-----+  Summary: Renal:  Right: 1-59% stenosis of  the right renal artery. Abnormal right        Resistive Index. Left:  No evidence of left renal artery stenosis. Abnormal left        Resisitve Index.  *See table(s) above for measurements and observations.  Diagnosing physician: Lemar Livings MD  Electronically signed by Lemar Livings MD on 10/07/2019 at 12:35:07 PM.    Final       Subjective: Feels better.  Denies complaints.  No headache or visual symptoms.  Anxious and eager to go home.  Discharge Exam:  Vitals:   10/07/19 0028 10/07/19 0447 10/07/19 0738 10/07/19 1130  BP: 120/76 (!) 153/69 (!) 180/70 (!) 120/55  Pulse: 89 98 95 92  Resp: Temp: 98.3 F (36.8 C) 98.2 F (36.8 C) 98.3 F (36.8 C) 98.6 F (37 C)  TempSrc: Oral Oral Oral Oral  SpO2: 97% 97% 98% 96%  Weight:  85.4 kg    Height:        General: Pleasant young female, moderately built and obese lying comfortably propped up in bed without distress. Cardiovascular: S1 & S2 heard, RRR, S1/S2 +. No murmurs, rubs, gallops or clicks. No JVD or pedal edema.  Telemetry personally reviewed: Sinus rhythm. Respiratory: Clear to auscultation without wheezing, rhonchi or crackles. No increased work of breathing. Abdominal:  Non distended, non tender & soft. No organomegaly or masses appreciated. Normal bowel sounds heard. CNS: Alert and oriented. No focal deficits. Extremities: no edema, no cyanosis    The results of significant diagnostics from this hospitalization (including imaging, microbiology, ancillary and laboratory) are listed below for reference.     Microbiology: Recent Results (from the past 240 hour(s))  SARS Coronavirus 2 by RT PCR (hospital order, performed in [facility] hospital lab) Nasopharyngeal Nasopharyngeal Swab     Status: None   Collection Time: 09/29/19  8:44 AM   Specimen: Nasopharyngeal Swab  Result Value Ref Range Status   SARS Coronavirus 2 NEGATIVE NEGATIVE Final    Comment: (NOTE) SARS-CoV-2 target nucleic acids are NOT  DETECTED. The SARS-CoV-2 RNA is generally detectable in upper and lower respiratory specimens during the acute phase of infection. The lowest concentration of SARS-CoV-2 viral copies this assay can detect is 250 copies / mL. A negative result does not preclude SARS-CoV-2 infection and should not be used as the sole basis for treatment or other patient management decisions.  A negative result may occur with improper specimen collection / handling, submission of specimen other than nasopharyngeal swab, presence of viral mutation(s) within the areas targeted by this assay, and inadequate number of viral copies (<250 copies / mL). A negative result must be combined with clinical observations, patient history, and epidemiological information. Fact Sheet for Patients:   BoilerBrush.com.cy Fact Sheet for Healthcare Providers: https://pope.com/ This test is not yet approved or cleared  by the [place] FDA and has been authorized for detection and/or diagnosis of SARS-CoV-2 by FDA under an Emergency Use Authorization (EUA).  This EUA will remain in effect (meaning this test can be used) for the duration of the COVID-19 declaration under Section 564(b)(1) of the Act, 21 U.S.C. section 360bbb-3(b)(1), unless the authorization is terminated or revoked sooner. Performed at [facility], [address] [place], [place] [address]   MRSA PCR Screening     Status: None   Collection Time: 09/29/19 12:16 PM   Specimen: Nasopharyngeal  Result Value Ref Range Status   MRSA by PCR NEGATIVE NEGATIVE Final    Comment:        The GeneXpert MRSA Assay (FDA approved for NASAL specimens only), is one component of a comprehensive MRSA colonization surveillance program. It is not intended to diagnose MRSA infection nor to guide or monitor treatment for MRSA infections. Performed at [facility], [place] [address], [place], [place] [address]   SARS Coronavirus 2 by RT PCR (hospital order, performed in [facility] hospital lab) Nasopharyngeal Nasopharyngeal Swab     Status: None   Collection Time: 10/06/19  3:09 PM   Specimen: Nasopharyngeal Swab  Result Value Ref Range Status   SARS Coronavirus 2 NEGATIVE NEGATIVE Final    Comment: (NOTE) SARS-CoV-2 target nucleic acids are NOT DETECTED. The SARS-CoV-2 RNA is generally detectable in upper and lower respiratory specimens during the acute phase of infection. The lowest concentration of SARS-CoV-2 viral copies this assay can detect is 250 copies / mL. A negative result does not preclude SARS-CoV-2 infection and should not be used as the sole basis for treatment or other patient management decisions.  A negative result may occur with improper specimen collection / handling, submission of specimen other than nasopharyngeal swab, presence of viral mutation(s) within the areas targeted by this assay, and inadequate number of viral copies (<250 copies / mL). A negative result must be combined with clinical observations, patient history, and epidemiological information. Fact Sheet for Patients:   StrictlyIdeas.no Fact Sheet for Healthcare Providers: BankingDealers.co.za This test is not yet approved or cleared  by the [place] FDA and has been authorized for detection and/or diagnosis of SARS-CoV-2 by FDA under an Emergency Use Authorization (EUA).  This EUA will remain in effect (meaning this test can be used) for the duration of the COVID-19 declaration under Section 564(b)(1) of the Act, 21 U.S.C. section 360bbb-3(b)(1), unless the authorization is terminated or revoked sooner. Performed at [facility], [place] [address], [place], [place] [address]      Labs: CBC: Recent Labs  Lab 10/01/19 0948 10/06/19 1009 10/07/19 0529  WBC 6.9 6.5 4.9  NEUTROABS 4.5 4.2  --   HGB 13.7 14.3 12.5   HCT 41.7 43.4 37.5  MCV 89.7 87.7 89.1  PLT 260 297 176    Basic Metabolic Panel: Recent Labs  Lab 10/01/19 0948 10/06/19 1248 10/07/19 0529  NA 141 140 138  K 4.5 4.1 3.5  CL 108 105 103  CO2 25 22 23   GLUCOSE 102* 102* 103*  BUN 13 8 9   CREATININE 1.03* 0.97 1.03*  CALCIUM 9.5 9.3 9.1    Liver Function Tests: Recent Labs  Lab 10/01/19 0948 10/06/19 1248  AST 20 23  ALT 13 12  ALKPHOS 101 90  BILITOT 0.7 1.0  PROT 7.4 6.8  ALBUMIN 3.9 3.7    Hgb A1c Recent Labs    10/06/19 2130  HGBA1C 4.9    Urinalysis    Component Value Date/Time   COLORURINE STRAW (A) 10/01/2019 1042   APPEARANCEUR CLEAR 10/01/2019 1042   LABSPEC 1.004 (L) 10/01/2019 1042   PHURINE 6.0 10/01/2019 1042   GLUCOSEU NEGATIVE 10/01/2019 1042   HGBUR NEGATIVE 10/01/2019 1042   BILIRUBINUR NEGATIVE 10/01/2019 1042   KETONESUR NEGATIVE 10/01/2019 1042   PROTEINUR NEGATIVE 10/01/2019 1042   NITRITE NEGATIVE 10/01/2019 1042   LEUKOCYTESUR NEGATIVE 10/01/2019 1042      Time coordinating discharge: 25 minutes  SIGNED:  Vernell Leep, MD,  FACP, SFHM. [facility]  To contact the attending provider between 7A-7P or the covering provider during after hours 7P-7A, please log into the web site [email or web address] and access using universal [place] password for that web site. If you do not have the password, please call the hospital operator.

## 2019-10-07 NOTE — Discharge Instructions (Signed)

## 2019-10-08 ENCOUNTER — Other Ambulatory Visit: Payer: Self-pay

## 2019-10-08 ENCOUNTER — Emergency Department (HOSPITAL_COMMUNITY): Payer: BC Managed Care – PPO

## 2019-10-08 ENCOUNTER — Encounter (HOSPITAL_COMMUNITY): Payer: Self-pay

## 2019-10-08 ENCOUNTER — Ambulatory Visit: Payer: Self-pay | Admitting: *Deleted

## 2019-10-08 ENCOUNTER — Emergency Department (HOSPITAL_COMMUNITY)
Admission: EM | Admit: 2019-10-08 | Discharge: 2019-10-09 | Disposition: A | Discharge status: Home / Self Care | Payer: BC Managed Care – PPO | Attending: Emergency Medicine | Admitting: Emergency Medicine

## 2019-10-08 DIAGNOSIS — I5032 Chronic diastolic (congestive) heart failure: Secondary | ICD-10-CM | POA: Diagnosis not present

## 2019-10-08 DIAGNOSIS — R202 Paresthesia of skin: Secondary | ICD-10-CM | POA: Diagnosis not present

## 2019-10-08 DIAGNOSIS — R0789 Other chest pain: Secondary | ICD-10-CM | POA: Insufficient documentation

## 2019-10-08 DIAGNOSIS — R030 Elevated blood-pressure reading, without diagnosis of hypertension: Secondary | ICD-10-CM

## 2019-10-08 DIAGNOSIS — R519 Headache, unspecified: Secondary | ICD-10-CM | POA: Insufficient documentation

## 2019-10-08 DIAGNOSIS — G4489 Other headache syndrome: Secondary | ICD-10-CM | POA: Diagnosis not present

## 2019-10-08 DIAGNOSIS — R42 Dizziness and giddiness: Secondary | ICD-10-CM | POA: Diagnosis not present

## 2019-10-08 DIAGNOSIS — I110 Hypertensive heart disease with heart failure: Secondary | ICD-10-CM | POA: Diagnosis not present

## 2019-10-08 DIAGNOSIS — I10 Essential (primary) hypertension: Secondary | ICD-10-CM | POA: Diagnosis not present

## 2019-10-08 DIAGNOSIS — R079 Chest pain, unspecified: Secondary | ICD-10-CM | POA: Diagnosis not present

## 2019-10-08 LAB — TROPONIN I (HIGH SENSITIVITY): Troponin I (High Sensitivity): 7 ng/L (ref <18)

## 2019-10-08 LAB — CBC
HCT: 39.3 % (ref 36.0–46.0)
Hemoglobin: 13 g/dL (ref 12.0–15.0)
MCH: 29.4 pg (ref 26.0–34.0)
MCHC: 33.1 g/dL (ref 30.0–36.0)
MCV: 88.9 fL (ref 80.0–100.0)
Platelets: 299 10*3/uL (ref 150–400)
RBC: 4.42 MIL/uL (ref 3.87–5.11)
RDW: 12.1 % (ref 11.5–15.5)
WBC: 6 10*3/uL (ref 4.0–10.5)
nRBC: 0 % (ref 0.0–0.2)

## 2019-10-08 LAB — BASIC METABOLIC PANEL
Anion gap: 12 (ref 5–15)
BUN: 7 mg/dL — ABNORMAL LOW (ref 8–23)
CO2: 23 mmol/L (ref 22–32)
Calcium: 9.8 mg/dL (ref 8.9–10.3)
Chloride: 105 mmol/L (ref 98–111)
Creatinine, Ser: 0.98 mg/dL (ref 0.44–1.00)
GFR calc Af Amer: >60 mL/min (ref >60)
GFR calc non Af Amer: >60 mL/min (ref >60)
Glucose, Bld: 128 mg/dL — ABNORMAL HIGH (ref 70–99)
Potassium: 3.5 mmol/L (ref 3.5–5.1)
Sodium: 140 mmol/L (ref 135–145)

## 2019-10-08 MED ORDER — SODIUM CHLORIDE 0.9 % IV BOLUS
1000.0000 mL | Freq: Once | INTRAVENOUS | Status: AC
  Administered 2019-10-08: 1000 mL via INTRAVENOUS

## 2019-10-08 MED ORDER — DEXAMETHASONE SODIUM PHOSPHATE 10 MG/ML IJ SOLN
10.0000 mg | Freq: Once | INTRAMUSCULAR | Status: DC
  Filled 2019-10-08: qty 1

## 2019-10-08 MED ORDER — PROCHLORPERAZINE EDISYLATE 10 MG/2ML IJ SOLN
10.0000 mg | Freq: Once | INTRAMUSCULAR | Status: AC
  Administered 2019-10-08: 10 mg via INTRAVENOUS
  Filled 2019-10-08: qty 2

## 2019-10-08 MED ORDER — MAGNESIUM SULFATE IN D5W 1-5 GM/100ML-% IV SOLN
1.0000 g | Freq: Once | INTRAVENOUS | Status: DC
  Filled 2019-10-08: qty 100

## 2019-10-08 MED ORDER — DIPHENHYDRAMINE HCL 50 MG/ML IJ SOLN
12.5000 mg | Freq: Once | INTRAMUSCULAR | Status: AC
  Administered 2019-10-08: 12.5 mg via INTRAVENOUS
  Filled 2019-10-08: qty 1

## 2019-10-08 MED ORDER — LABETALOL HCL 5 MG/ML IV SOLN
20.0000 mg | Freq: Once | INTRAVENOUS | Status: AC
  Administered 2019-10-08: 20 mg via INTRAVENOUS
  Filled 2019-10-08: qty 4

## 2019-10-08 NOTE — ED Provider Notes (Signed)
[facility] EMERGENCY DEPARTMENT Provider Note   CSN: [id number] Arrival date & time: 10/08/19  1957     History Chief Complaint  Patient presents with  . Hypertension    April [name] is a 64 y.o. female with history of anxiety, hypertension presents today for evaluation of gradual onset, worsening left-sided headache since yesterday as well as hypertension.  She had a recent admission for similar symptoms on 10/06/2019 and was discharged yesterday 10/07/2019.  At the time she was noted to be markedly hypertensive.  She was discharged from the hospital yesterday with improvement in her blood pressure and medications were adjusted.  She reports that she has been taking the labetalol 100 mg twice daily and hydralazine 50 mg 3 times daily as prescribed.  She states that since returning home she has felt progressively worsening generalized weakness to the point that when she stands she becomes lightheaded and she is afraid to ambulate.  She reports left-sided headaches similar to what she experienced a couple of days ago when her blood pressure was elevated.  She also notes associated blurred vision and paresthesias involving the left upper and lower extremities.  She denies nausea or vomiting.  She reports that she has felt "winded".  Her blood pressures have been elevated while at home, lowest was 170 systolic and she reports she has had systolics in the 300s despite medication compliance.  While in the ambulance with EMS she developed substernal chest pain radiating to the left side which resolved with 1 sublingual nitroglycerin and was short-lived and was not associated with any other symptoms.  She is a non-smoker.  The history is provided by the patient.       Past Medical History:  Diagnosis Date  . Anxiety   . Hypertension   . Iron (Fe) deficiency anemia     Patient Active Problem List   Diagnosis Date Noted  . Hypertensive crisis without congestive heart failure  10/06/2019  . Chronic diastolic CHF (congestive heart failure) (HCC) 10/06/2019  . Hypertensive urgency 09/29/2019  . Elevated troponin 09/29/2019  . Hypokalemia 09/29/2019    Past Surgical History:  Procedure Laterality Date  . CESAREAN SECTION       OB History   No obstetric history on file.     Family History  Problem Relation Age of Onset  . CVA Mother   . Hypertension Mother   . Hypertension Sister   . Hypertension Brother   . Hypertension Maternal Grandmother   . Kidney failure Neg Hx     Social History   Tobacco Use  . Smoking status: Never Smoker  . Smokeless tobacco: Never Used  Substance Use Topics  . Alcohol use: Not Currently    Comment: rare  . Drug use: Never    Home Medications Prior to Admission medications   Medication Sig Start Date End Date Taking? Authorizing Provider  Ensure (ENSURE) Take 237 mLs by mouth daily as needed (for supplementation).   Yes [provider]  hydrALAZINE (APRESOLINE) 50 MG tablet Take 1 tablet (50 mg total) by mouth 3 (three) times daily. 10/10/19   Anders Simmonds, PA-C  labetalol (NORMODYNE) 100 MG tablet Take 2 in the morning and 1 at bedtime 10/10/19   Anders Simmonds, PA-C    Allergies    Amlodipine, Aspirin, Clonidine derivatives, Darvon [propoxyphene hcl], Lisinopril, Toprol xl [metoprolol], and Valium [diazepam]  Review of Systems   Review of Systems  Constitutional: Negative for chills and fever.  Eyes: Positive for photophobia and visual disturbance.  Respiratory: Positive for shortness of breath.   Cardiovascular: Positive for chest pain.  Gastrointestinal: Negative for abdominal pain, nausea and vomiting.  Neurological: Positive for weakness (Generalized), numbness and headaches.  All other systems reviewed and are negative.   Physical Exam Updated Vital Signs BP (!) 161/76   Pulse (!) 103   Temp 98.7 F (37.1 C) (Oral)   Resp 19   Ht 5\' 4"  (1.626 m)   Wt 86 kg   SpO2 96%   BMI  32.54 kg/m   Physical Exam Vitals and nursing note reviewed.  Constitutional:      General: She is not in acute distress.    Appearance: She is well-developed.  HENT:     Head: Normocephalic and atraumatic.  Eyes:     General:        Right eye: No discharge.        Left eye: No discharge.     Extraocular Movements: Extraocular movements intact.     Conjunctiva/sclera: Conjunctivae normal.     Pupils: Pupils are equal, round, and reactive to light.  Neck:     Vascular: No JVD.     Trachea: No tracheal deviation.  Cardiovascular:     Rate and Rhythm: Normal rate and regular rhythm.  Pulmonary:     Effort: Pulmonary effort is normal.     Breath sounds: Normal breath sounds.  Abdominal:     General: Bowel sounds are normal. There is no distension.     Palpations: Abdomen is soft.     Tenderness: There is no abdominal tenderness. There is no guarding or rebound.  Musculoskeletal:     Cervical back: Normal range of motion and neck supple.  Skin:    General: Skin is warm and dry.     Findings: No erythema.  Neurological:     Mental Status: She is alert.     Comments: Mental Status:  Alert, thought content appropriate, able to give a coherent history. Speech fluent without evidence of aphasia. Able to follow 2 step commands without difficulty.  Cranial Nerves:  II:  Peripheral visual fields grossly normal, pupils equal, round, reactive to light III,IV, VI: ptosis not present, extra-ocular motions intact bilaterally  V,VII: smile symmetric, facial light touch sensation equal VIII: hearing grossly normal to voice  X: uvula elevates symmetrically  XI: bilateral shoulder shrug symmetric and strong XII: midline tongue extension without fassiculations Motor:  Normal tone. 5/5 strength of BUE and BLE major muscle groups including strong and equal grip strength and dorsiflexion/plantar flexion Sensory: subjectively altered sensation to light touch of left upper extremity  only Cerebellar: normal finger-to-nose with bilateral upper extremities Gait: patient deferred   Psychiatric:        Behavior: Behavior normal.     ED Results / Procedures / Treatments   Labs (all labs ordered are listed, but only abnormal results are displayed) Labs Reviewed  BASIC METABOLIC PANEL - Abnormal; Notable for the following components:      Result Value   Glucose, Bld 128 (*)    BUN 7 (*)    All other components within normal limits  CBC  TROPONIN I (HIGH SENSITIVITY)  TROPONIN I (HIGH SENSITIVITY)    EKG EKG Interpretation  Date/Time:  Tuesday October 08 2019 19:57:28 EDT Ventricular Rate:  122 PR Interval:  130 QRS Duration: 78 QT Interval:  334 QTC Calculation: 475 R Axis:   60 Text Interpretation: Sinus tachycardia Biatrial enlargement Cannot  rule out Anterior infarct , age undetermined Abnormal ECG When compared with ECG of 10/06/2019, No significant change was found Confirmed by Dione Booze ([id number]) on 10/09/2019 12:46:08 AM   Radiology  DG Chest 2 View  Result Date: 10/08/2019 CLINICAL DATA:  Dizziness and chest pain in a patient with marked hypertension. EXAM: CHEST - 2 VIEW COMPARISON:  Single-view of the chest 10/06/2019. PA and lateral chest 05/22/2012. FINDINGS: Lungs clear. Heart size normal. No pneumothorax or pleural fluid. No acute or focal bony abnormality. IMPRESSION: Negative chest. Electronically Signed   By: Drusilla Kanner M.D.   On: 10/08/2019 20:39   CT Head Wo Contrast  Result Date: 10/08/2019 CLINICAL DATA:  Headache, hypertension EXAM: CT HEAD WITHOUT CONTRAST TECHNIQUE: Contiguous axial images were obtained from the base of the skull through the vertex without intravenous contrast. COMPARISON:  09/27/2019 FINDINGS: Brain: No acute infarct or hemorrhage. Lateral ventricles and midline structures are stable. No acute extra-axial fluid collections. No mass effect. Vascular: No hyperdense vessel or unexpected calcification. Skull: Normal.  Negative for fracture or focal lesion. Sinuses/Orbits: No acute finding. Other: None. IMPRESSION: 1. No acute intracranial process. Electronically Signed   By: Sharlet Salina M.D.   On: 10/08/2019 21:38    Procedures Procedures (including critical care time)  Medications Ordered in ED Medications  labetalol (NORMODYNE) injection 20 mg (20 mg Intravenous Given 10/08/19 2155)  prochlorperazine (COMPAZINE) injection 10 mg (10 mg Intravenous Given 10/08/19 2159)  diphenhydrAMINE (BENADRYL) injection 12.5 mg (12.5 mg Intravenous Given 10/08/19 2158)  sodium chloride 0.9 % bolus 1,000 mL (0 mLs Intravenous Stopped 10/09/19 0100)  dexamethasone (DECADRON) injection 10 mg (10 mg Intramuscular Given 10/09/19 0059)    ED Course  I have reviewed the triage vital signs and the nursing notes.  Pertinent labs & imaging results that were available during my care of the patient were reviewed by me and considered in my medical decision making (see chart for details).    MDM Rules/Calculators/A&P                      Patient presenting for evaluation of persistent hypertension with associated left-sided headache, blurred vision, paresthesias of the left side.  Also developed nonspecific chest pain with EMS.  Recently admitted for hypertension which improved with hydralazine and labetalol.  She had some medication adjustments and was discharged home with improvement in her symptoms and her blood pressure but reports that her blood pressure became elevated once again despite compliance with home medications.  In the ED she is markedly hypertensive as well as tachycardic.  Her neurologic examination is reassuring with only altered sensation to light touch of the left upper extremity.  She refused to ambulate initially due to feeling generally weak and lightheaded with ambulation.  We'll give her a dose of IV labetalol in the ED and assess for any potential endorgan damage to rule out hypertensive emergency.  EKG shows  sinus tachycardia, no evidence of acute ischemic abnormalities and serial troponins are negative.  At this time doubt ACS/MI as her symptoms are atypical in nature.  Chest x-ray shows no acute cardiopulmonary abnormalities with no evidence of pneumothorax, edema, cardiomegaly, or pneumonia.  Head CT shows no evidence of acute intracranial abnormality including infarct, mass, or hemorrhage.  Lab work reviewed and interpreted by myself shows no leukocytosis, no anemia, no metabolic derangements, no renal insufficiency.  No evidence of endorgan damage on work-up today.  Patient did receive a migraine cocktail in the ED  with significant improvement in her headache as well as improvement in her blood pressures with migraine cocktail and IV labetalol.  On reevaluation she is resting comfortably and tells me that she feels better.  She has an appointment to see her PCP in 2 days.  I recommended that she increase her labetalol dose for the next couple of days until then, 200 mg in the morning and 100 mg at night.  She tells me that she checks her blood pressure several times a day and we discussed appropriate means and frequency of measuring blood pressure in order to best assess efficacy of her medication regimen.  We also discussed dietary modifications.  She feels comfortable with discharge home.  Discussed strict ED return precautions.  Patient and husband verbalized understanding of and agreement with plan and patient is stable for discharge at this time.  Discussed with Dr. Vanita Panda who agrees with assessment and plan at this time.  Final Clinical Impression(s) / ED Diagnoses Final diagnoses:  Elevated blood pressure reading  Left-sided headache  Atypical chest pain    Rx / DC Orders ED Discharge Orders    None       Debroah Baller 10/11/19 1107    Carmin Muskrat, MD 10/11/19 2042

## 2019-10-08 NOTE — ED Notes (Signed)
Pt transported to CT ?

## 2019-10-08 NOTE — ED Triage Notes (Addendum)
Pt BIB [facility] for eval of hypertension. Pt was recently just d/c'd for same from here yesterday. Pt reports headache/nausea all day. EMS reports initial manual >300. 240 systolic on arrival. Pt had meds recently doubled after DC, but no improvement w/ pressure. Pt did develop CP on the way here, and was given 1 SL NTG by EMS w/ improvement of CP.

## 2019-10-08 NOTE — Telephone Encounter (Signed)
Pt called with complaints of increased BP; the pt states she was seen in the ED, and has been taking her medication as ordered; her BP on 10/08/19 was 204/90 P 93 @ 0215, and  214/87 P 113 @ 0710; recommendations made per nurse triage protocol; she verbalized understanding, and will proceed to the ED.   Reason for Disposition . [1] Systolic BP  >= 200 OR Diastolic >= 120  AND [2] having NO cardiac or neurologic symptoms  Answer Assessment - Initial Assessment Questions 1. BLOOD PRESSURE: "What is the blood pressure?" "Did you take at least two measurements 5 minutes apart?"     204/90 and 214/87 2. ONSET: "When did you take your blood pressure?"     10/08/19 at 0215 and 0710 3. HOW: "How did you obtain the blood pressure?" (e.g., visiting nurse, automatic home BP monitor)     Home cuff left upper arm 4. HISTORY: "Do you have a history of high blood pressure?"     yes 5. MEDICATIONS: "Are you taking any medications for blood pressure?" "Have you missed any doses recently?"     no 6. OTHER SYMPTOMS: "Do you have any symptoms?" (e.g., headache, chest pain, blurred vision, difficulty breathing, weakness)    tinging in left arm at 0400 7. PREGNANCY: "Is there any chance you are pregnant?" "When was your last menstrual period?"    n/a  Protocols used: HIGH BLOOD PRESSURE-A-AH

## 2019-10-09 LAB — TROPONIN I (HIGH SENSITIVITY): Troponin I (High Sensitivity): 10 ng/L (ref <18)

## 2019-10-09 MED ORDER — DEXAMETHASONE SODIUM PHOSPHATE 10 MG/ML IJ SOLN
10.0000 mg | Freq: Once | INTRAMUSCULAR | Status: AC
  Administered 2019-10-09: 10 mg via INTRAMUSCULAR

## 2019-10-09 NOTE — Progress Notes (Signed)
April [name], is a 64 y.o. female  LKG:[id number]  GUY:[id number]  DOB - 1955/08/04  Subjective:  Chief Complaint and HPI: April [name] is a 64 y.o. female here today to establish care and for a follow up visit After hospitalization 6/6-6-10/2019 for hypertensive crisis.  When she was discharged 6/8(3 days ago)on 200 labetolol in the morning and 100mg  at night(this was typed into her discharge instructions and she says they told her that verbally as well-we discussed this at length) and hydralazine 50mg  tid.  She feels fine.  Denies CP.  Had echo about 10 years ago and says it was normal.  She says her BP has always "been hard to treat."  There have also been periods of non-compliance.  She has a BP cuff at home.    From discharge summary: Discharge Diagnoses:  Principal Problem:   Hypertensive crisis without congestive heart failure Active Problems:   Chronic diastolic CHF (congestive heart failure) [facility])   Brief Summary: 64 year old married female with PMH of hypertension, chronic diastolic CHF and obesity presented to the [facility] ED on 10/06/2019 due to uncontrolled hypertension.  She was previously hospitalized from 5/30-5/31 for the same issue, having stopped taking her antihypertensives several months prior.  She reported that her blood pressures had not been stable for 2 days prior to this visit.  This was her fifth trip to the ER.  She checks her blood pressure frequently and noted significant lability.  Night prior to this admission, it was in the 200 range with associated palpitations, lightheadedness, dizziness.  On the morning of admission it was SBP of 248 and reportedly with EMS "over 300".  She claims compliance with her antihypertensive medications.  She had some headache and her vision was blurry.  She reported bilateral upper extremity tingling/numbness.  Felt tired.  She was admitted for hypertensive crisis.  Assessment and plan:  Hypertensive crisis/emergency:  She  presented with poorly controlled hypertension now despite compliance with her antihypertensives.  She had associated symptoms as noted above.  She received a couple doses of as needed IV labetalol in the ED.  She was then placed on increased dose of hydralazine 50 mg 3 times daily.  Toprol-XL was discontinued due to reported nightmares.  She was started on labetalol 100 mg twice daily.  With these measures blood pressure control has significantly improved.  Although this morning it was 180/70, that was prior to taking her medications.  Since then her BP was 120/55.  She has now been asymptomatic since last night.  No headache, visual symptoms, dizziness, lightheadedness, strokelike symptoms, chest pain or dyspnea.  Work-up for secondary causes was initiated.  A.m. cortisol: 7.2.  A1c 4.9.  Urine metanephrines, renin aldosterone activity and ratio have been drawn and pending and must be followed as outpatient during PCP visit.  BNP was normal.  Troponin minimally elevated with a flat trend, 30 > 27 suggestive of demand ischemia related to uncontrolled hypertension.  Renal artery Doppler results are as noted below.  I reviewed these results in detail with the reading vascular surgeon who advised that she did not have significant renal artery stenosis, likely has medical renal disease, okay to use ACEI/ARB if needed.  I have updated patient regarding these results.  She has an upcoming follow-up appointment with her PCP on 6/10 and she is advised to keep that appointment.  Improved.  TSH 1.006 on 5/30.  I did a funduscopic exam at bedside.  No papilledema noted.  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  She reports that she was recently seen by her ophthalmologist and has regular visits with him-advised to continue.  As outpatient may consider titrating up labetalol or adding a thiazide diuretic if blood pressures need further control.  Chronic diastolic CHF: TTE 09/29/2019: LVEF 70-75% and  grade 1 diastolic dysfunction.  Compensated without features of volume overload.  Hyperglycemia: Not diabetic.  A1c 4.9.  Obesity/Body mass index is 32.3 kg/m. Advise diet, exercise and weight loss which should also help reducing her blood pressure.   ED/Hospital notes reviewed.    ROS:   Constitutional:  No f/c, No night sweats, No unexplained weight loss. EENT:  No vision changes, No blurry vision, No hearing changes. No mouth, throat, or ear problems.  Respiratory: No cough, No SOB Cardiac: No CP, no palpitations GI:  No abd pain, No N/V/D. GU: No Urinary s/sx Musculoskeletal: No joint pain Neuro: No headache, no dizziness, no motor weakness.  Skin: No rash Endocrine:  No polydipsia. No polyuria.  Psych: Denies SI/HI  No problems updated.  ALLERGIES: Allergies  Allergen Reactions  . Amlodipine Other (See Comments)    "Dental issues"   . Aspirin Hives  . Clonidine Derivatives Other (See Comments)    Unknown reaction  . Darvon [Propoxyphene Hcl] Other (See Comments)    Dizziness  . Lisinopril Other (See Comments)    Gout   . Toprol Xl [Metoprolol] Other (See Comments)    Nightmares   . Valium [Diazepam] Other (See Comments)    Dizziness    PAST MEDICAL HISTORY: Past Medical History:  Diagnosis Date  . Anxiety   . Hypertension   . Iron (Fe) deficiency anemia     MEDICATIONS AT HOME: Prior to Admission medications   Medication Sig Start Date End Date Taking? Authorizing Provider  Ensure (ENSURE) Take 237 mLs by mouth daily as needed (for supplementation).   Yes [provider]  hydrALAZINE (APRESOLINE) 50 MG tablet Take 1 tablet (50 mg total) by mouth 3 (three) times daily. 10/10/19  Yes Georgian Co M, PA-C  labetalol (NORMODYNE) 100 MG tablet Take 2 in the morning and 1 at bedtime 10/10/19  Yes Ipek Westra, Marzella Schlein, PA-C     Objective:  EXAM:   Vitals:   10/10/19 0926  BP: (!) 161/75  Pulse: (!) 103  Resp: 16  Temp: 98.1 F (36.7 C)    SpO2: 98%  Weight: 190 lb (86.2 kg)  Height: 5\' 4"  (1.626 m)    General appearance : A&OX3. NAD. Non-toxic-appearing HEENT: Atraumatic and Normocephalic.  PERRLA. EOM intact.  Neck: supple, no JVD. No cervical lymphadenopathy. No thyromegaly Chest/Lungs:  Breathing-non-labored, Good air entry bilaterally, breath sounds normal without rales, rhonchi, or wheezing  CVS: S1 S2 regular, no murmurs, gallops, rubs  Extremities: Bilateral Lower Ext shows no edema, both legs are warm to touch with = pulse throughout Neurology:  CN II-XII grossly intact, Non focal.   Psych:  TP linear. J/I WNL. Normal speech. Appropriate eye contact and affect.  Skin:  No Rash  Data Review Lab Results  Component Value Date   HGBA1C 4.9 10/06/2019     Assessment & Plan   1. Chronic diastolic CHF (congestive heart failure) (HCC) - Ambulatory referral to Cardiology  2. Hypertensive urgency htn has been refractory to treatment.  Counseled at length >35 mins face to face on proper diet, exercise and healthy habits.  Check blood pressure and pulse 1-2 times daily and record and bring to next  visit.   - labetalol (NORMODYNE) 100 MG tablet; Take 2 in the morning and 1 at bedtime  Dispense: 90 tablet; Refill: 2 - hydrALAZINE (APRESOLINE) 50 MG tablet; Take 1 tablet (50 mg total) by mouth 3 (three) times daily.  Dispense: 90 tablet; Refill: 2 - Ambulatory referral to Cardiology  3. Hypertension, unspecified type Not at goal but better at visit end-148/70 - Ambulatory referral to Cardiology  4. Hospital discharge follow-up  Patient have been counseled extensively about nutrition and exercise  Return in about 1 month (around 11/09/2019) for assign PCP;  check BP and bloodwork.  The patient was given clear instructions to go to ER or return to medical center if symptoms don't improve, worsen or new problems develop. The patient verbalized understanding. The patient was told to call to get lab results if they  haven't heard anything in the next week.     Freeman Caldron, PA-C [facility] [place], [place]   10/10/2019, 9:49 AM

## 2019-10-09 NOTE — Discharge Instructions (Signed)
We recommend that you increase your labetalol to 2 tablets in the morning and 1 tablet at night.  Continue to take your hydralazine 3 times daily as prescribed.  I would recommend checking your blood pressure around 1 to 2 hours after taking your medications but I would not recommend rechecking multiple times.  Drink plenty water and get rest.  Avoid high salt foods.  You can take extra strength Tylenol every 6 hours as needed for headaches or other pains.  Do not exceed more than 4000 mg of Tylenol daily.  Follow-up with your PCP on the 10th as scheduled for reevaluation and recheck.  Return to the emergency department if any concerning signs or symptoms develop such as fevers, loss of consciousness, severe headaches, weakness.

## 2019-10-10 ENCOUNTER — Ambulatory Visit: Payer: BC Managed Care – PPO | Attending: Family Medicine | Admitting: Physician Assistant

## 2019-10-10 ENCOUNTER — Other Ambulatory Visit: Payer: Self-pay

## 2019-10-10 VITALS — BP 148/70 | HR 103 | Temp 98.1°F | Resp 16 | Ht 64.0 in | Wt 190.0 lb

## 2019-10-10 DIAGNOSIS — I5032 Chronic diastolic (congestive) heart failure: Secondary | ICD-10-CM | POA: Diagnosis not present

## 2019-10-10 DIAGNOSIS — Z09 Encounter for follow-up examination after completed treatment for conditions other than malignant neoplasm: Secondary | ICD-10-CM

## 2019-10-10 DIAGNOSIS — I160 Hypertensive urgency: Secondary | ICD-10-CM

## 2019-10-10 DIAGNOSIS — I10 Essential (primary) hypertension: Secondary | ICD-10-CM

## 2019-10-10 LAB — METANEPHRINES, URINE, 24 HOUR
Metaneph Total, Ur: 268 ug/L
Metanephrines, 24H Ur: 114 ug/24 hr (ref 36–209)
Normetanephrine, 24H Ur: 426 ug/24 hr (ref 131–612)
Normetanephrine, Ur: 1003 ug/L
Total Volume: 425

## 2019-10-10 LAB — ALDOSTERONE + RENIN ACTIVITY W/ RATIO
ALDO / PRA Ratio: 1.9 (ref 0.0–30.0)
Aldosterone: 2.8 ng/dL (ref 0.0–30.0)
PRA LC/MS/MS: 1.496 ng/mL/hr (ref 0.167–5.380)

## 2019-10-10 MED ORDER — HYDRALAZINE HCL 50 MG PO TABS: 50.0000 mg | ORAL_TABLET | Freq: Three times a day (TID) | ORAL | 2 refills | Status: DC

## 2019-10-10 MED ORDER — LABETALOL HCL 100 MG PO TABS: ORAL_TABLET | ORAL | 2 refills | Status: DC

## 2019-10-10 NOTE — Patient Instructions (Signed)
Check blood pressure and pulse 1-2 times daily and record and bring to next visit.

## 2019-10-10 NOTE — Progress Notes (Signed)
Here for HTN f /u  

## 2019-10-21 ENCOUNTER — Telehealth: Payer: Self-pay | Admitting: Internal Medicine

## 2019-10-21 NOTE — Telephone Encounter (Signed)
Patient states she is returning a call however no current notes or results are available. Made patient aware that the appointment may have been an appointment reminder.

## 2019-10-21 NOTE — Telephone Encounter (Signed)
Spoke with pt who report she now realize the call was an appointment reminder and will be sure to attend appointment.

## 2019-10-24 ENCOUNTER — Encounter: Payer: Self-pay | Admitting: Internal Medicine

## 2019-10-24 ENCOUNTER — Ambulatory Visit (INDEPENDENT_AMBULATORY_CARE_PROVIDER_SITE_OTHER): Payer: BC Managed Care – PPO | Admitting: Internal Medicine

## 2019-10-24 ENCOUNTER — Other Ambulatory Visit: Payer: Self-pay

## 2019-10-24 VITALS — BP 182/78 | HR 103 | Ht 64.0 in | Wt 189.0 lb

## 2019-10-24 DIAGNOSIS — I5032 Chronic diastolic (congestive) heart failure: Secondary | ICD-10-CM | POA: Diagnosis not present

## 2019-10-24 DIAGNOSIS — I160 Hypertensive urgency: Secondary | ICD-10-CM | POA: Diagnosis not present

## 2019-10-24 MED ORDER — IRBESARTAN 75 MG PO TABS: 75.0000 mg | ORAL_TABLET | Freq: Every day | ORAL | 6 refills | Status: DC

## 2019-10-24 NOTE — Progress Notes (Signed)
Cardiology Office Note:    Date:  10/24/2019   ID:  April [name], DOB Jul 29, 1955, MRN [id number]  PCP:  Patient, No Pcp Per  Cardiologist:  No primary care provider on file.  Electrophysiologist:  None   Referring MD: Argentina Donovan, PA-C   Chief Complaint: HTN  History of Present Illness:    April [name] is a 64 y.o. female with a history of HTN, HFpEF who presents for evaluation of HTN with at times labile BP.   Recently seen in the ER for HTN with left sided headache with severely elevated BP. Admitted and BP meds adjusted however after discharge returned to ED for generalized weakness, blurred vision and paresthesias. Felt winded as well.   She notes heart racing with minimal activity, and extreme fatigue. At that time BP is high.She had chest pain that was nitro responsive.  117/59 - 154/74 Next day 118/72 - 170/73 Yesterday 171/78 P 92, 179/81 P 93  At home, 220, 240, 260 mmHg.   Past Medical History:  Diagnosis Date  . Anxiety   . Hypertension   . Iron (Fe) deficiency anemia     Past Surgical History:  Procedure Laterality Date  . CESAREAN SECTION      Current Medications: Current Meds  Medication Sig  . Ensure (ENSURE) Take 237 mLs by mouth daily as needed (for supplementation).  . hydrALAZINE (APRESOLINE) 50 MG tablet Take 1 tablet (50 mg total) by mouth 3 (three) times daily.  Marland Kitchen labetalol (NORMODYNE) 100 MG tablet Take 2 in the morning and 1 at bedtime     Allergies:   Amlodipine, Aspirin, Clonidine derivatives, Darvon [propoxyphene hcl], Lisinopril, Toprol xl [metoprolol], and Valium [diazepam]   Social History   Socioeconomic History  . Marital status: Married    Spouse name: Not on file  . Number of children: Not on file  . Years of education: Not on file  . Highest education level: Not on file  Occupational History  . Occupation: retired  Tobacco Use  . Smoking status: Never Smoker  . Smokeless tobacco: Never Used  Substance and  Sexual Activity  . Alcohol use: Not Currently    Comment: rare  . Drug use: Never  . Sexual activity: Not on file  Other Topics Concern  . Not on file  Social History Narrative  . Not on file   Social Determinants of Health   Financial Resource Strain:   . Difficulty of Paying Living Expenses:   Food Insecurity:   . Worried About Charity fundraiser in the Last Year:   . Arboriculturist in the Last Year:   Transportation Needs:   . Film/video editor (Medical):   Marland Kitchen Lack of Transportation (Non-Medical):   Physical Activity:   . Days of Exercise per Week:   . Minutes of Exercise per Session:   Stress:   . Feeling of Stress :   Social Connections:   . Frequency of Communication with Friends and Family:   . Frequency of Social Gatherings with Friends and Family:   . Attends Religious Services:   . Active Member of Clubs or Organizations:   . Attends Archivist Meetings:   Marland Kitchen Marital Status:      Family History: The patient's family history includes CVA in her mother; Hypertension in her brother, maternal grandmother, mother, and sister. There is no history of Kidney failure.  ROS:   Please see the history of present illness.  All other systems reviewed and are negative.  EKGs/Labs/Other Studies Reviewed:    The following studies were reviewed today:  EKG:  Sinus tachycardia, biatrial enlargement. 10/09/19  Recent Labs: 09/29/2019: Magnesium 1.9; TSH 1.006 10/06/2019: ALT 12; B Natriuretic Peptide 37.7 10/08/2019: BUN 7; Creatinine, Ser 0.98; Hemoglobin 13.0; Platelets 299; Potassium 3.5; Sodium 140  Recent Lipid Panel No results found for: CHOL, TRIG, HDL, CHOLHDL, VLDL, LDLCALC, LDLDIRECT  Physical Exam:    VS:  BP (!) 182/78   Pulse (!) 103   Ht 5\' 4"  (1.626 m)   Wt 189 lb (85.7 kg)   SpO2 98%   BMI 32.44 kg/m     Wt Readings from Last 5 Encounters:  10/24/19 189 lb (85.7 kg)  10/10/19 190 lb (86.2 kg)  10/08/19 189 lb 9.5 oz (86 kg)  10/07/19  188 lb 3.2 oz (85.4 kg)  09/29/19 188 lb 4.4 oz (85.4 kg)     Constitutional: No acute distress Eyes: sclera non-icteric, normal conjunctiva and lids ENMT: normal dentition, moist mucous membranes Cardiovascular: regular rhythm, normal rate, no murmurs. S1 and S2 normal. Radial pulses normal bilaterally. No jugular venous distention.  Respiratory: clear to auscultation bilaterally GI : normal bowel sounds, soft and nontender. No distention.   MSK: extremities warm, well perfused. No edema.  NEURO: grossly nonfocal exam, moves all extremities. PSYCH: alert and oriented x 3, normal mood and affect.   ASSESSMENT:    1. Hypertensive urgency   2. Chronic diastolic CHF (congestive heart failure) (HCC)    PLAN:    HTN urgency - we will start irbesartan 75 mg daily and check labs in 7-10 days. She should continue labetalol and hydralazine. Recommend evaluation in HTN clinic with Dr. 9-10, patient agrees this is appropriate next step.   Chronic diastolic HF -  lasix 20 mg daily for swelling.    Total time of encounter: 40 minutes total time of encounter, including 25 minutes spent in face-to-face patient care on the date of this encounter. This time includes coordination of care and counseling regarding above mentioned problem list. Remainder of non-face-to-face time involved reviewing chart documents/testing relevant to the patient encounter and documentation in the medical record. I have independently reviewed documentation from referring provider.   [name], MD [place]  [facility]    Medication Adjustments/Labs and Tests Ordered: Current medicines are reviewed at length with the patient today.  Concerns regarding medicines are outlined above.  Orders Placed This Encounter  Procedures  . Basic metabolic panel   Meds ordered this encounter  Medications  . DISCONTD: irbesartan (AVAPRO) 75 MG tablet    Sig: Take 1 tablet (75 mg total) by mouth daily.    Dispense:   30 tablet    Refill:  6    Patient Instructions  Medication Instructions:   START IRBESARTAN 75 MG ONE TABLET DAILY     *If you need a refill on your cardiac medications before your next appointment, please call your pharmacy*   Lab Work: BMP IN 7 - 10 DAYS  AFTER STARTING  IRBESARTAN  If you have labs (blood work) drawn today and your tests are completely normal, you will receive your results only by: Weston Brass MyChart Message (if you have MyChart) OR . A paper copy in the mail If you have any lab test that is abnormal or we need to change your treatment, we will call you to review the results.   Testing/Procedures: NOT NEEDED   Follow-Up: At [facility], you  and your health needs are our priority.  As part of our continuing mission to provide you with exceptional heart care, we have created designated Provider Care Teams.  These Care Teams include your primary Cardiologist (physician) and Advanced Practice Providers (APPs -  Physician Assistants and Nurse Practitioners) who all work together to provide you with the care you need, when you need it.  We recommend signing up for the patient portal called "MyChart".  Sign up information is provided on this After Visit Summary.  MyChart is used to connect with patients for Virtual Visits (Telemedicine).  Patients are able to view lab/test results, encounter notes, upcoming appointments, etc.  Non-urgent messages can be sent to your provider as well.   To learn more about what you can do with MyChart, go to [email or web address].    Your next appointment:   2 week(s)  The format for your next appointment:   In Person  Provider:   You may see  one of the following Advanced Practice Providers on your designated Care Team:    Theodore Demark, PA-C  Joni Reining, DNP, ANP  Cadence Fransico Michael, NP    Other Instructions  You have been referred to HYPERTENSION  CLINIC WITH DR [facility]

## 2019-10-24 NOTE — Patient Instructions (Addendum)
Medication Instructions:   START IRBESARTAN 75 MG ONE TABLET DAILY     *If you need a refill on your cardiac medications before your next appointment, please call your pharmacy*   Lab Work: BMP IN 7 - 10 DAYS  AFTER STARTING  IRBESARTAN  If you have labs (blood work) drawn today and your tests are completely normal, you will receive your results only by: Marland Kitchen MyChart Message (if you have MyChart) OR . A paper copy in the mail If you have any lab test that is abnormal or we need to change your treatment, we will call you to review the results.   Testing/Procedures: NOT NEEDED   Follow-Up: At [facility], you and your health needs are our priority.  As part of our continuing mission to provide you with exceptional heart care, we have created designated Provider Care Teams.  These Care Teams include your primary Cardiologist (physician) and Advanced Practice Providers (APPs -  Physician Assistants and Nurse Practitioners) who all work together to provide you with the care you need, when you need it.  We recommend signing up for the patient portal called "MyChart".  Sign up information is provided on this After Visit Summary.  MyChart is used to connect with patients for Virtual Visits (Telemedicine).  Patients are able to view lab/test results, encounter notes, upcoming appointments, etc.  Non-urgent messages can be sent to your provider as well.   To learn more about what you can do with MyChart, go to [email or web address].    Your next appointment:   2 week(s)  The format for your next appointment:   In Person  Provider:   You may see  one of the following Advanced Practice Providers on your designated Care Team:    Theodore Demark, PA-C  Joni Reining, DNP, ANP  Cadence Fransico Michael, NP    Other Instructions  You have been referred to HYPERTENSION  CLINIC WITH DR [facility]

## 2019-10-29 DIAGNOSIS — I5032 Chronic diastolic (congestive) heart failure: Secondary | ICD-10-CM | POA: Diagnosis not present

## 2019-10-29 DIAGNOSIS — I160 Hypertensive urgency: Secondary | ICD-10-CM | POA: Diagnosis not present

## 2019-10-29 DIAGNOSIS — I169 Hypertensive crisis, unspecified: Secondary | ICD-10-CM | POA: Diagnosis not present

## 2019-10-30 LAB — BASIC METABOLIC PANEL
BUN/Creatinine Ratio: 14 (ref 12–28)
BUN: 14 mg/dL (ref 8–27)
CO2: 22 mmol/L (ref 20–29)
Calcium: 9.9 mg/dL (ref 8.7–10.3)
Chloride: 105 mmol/L (ref 96–106)
Creatinine, Ser: 1.02 mg/dL — ABNORMAL HIGH (ref 0.57–1.00)
GFR calc Af Amer: 67 mL/min/{1.73_m2} (ref >59)
GFR calc non Af Amer: 58 mL/min/{1.73_m2} — ABNORMAL LOW (ref >59)
Glucose: 88 mg/dL (ref 65–99)
Potassium: 4.5 mmol/L (ref 3.5–5.2)
Sodium: 141 mmol/L (ref 134–144)

## 2019-10-31 ENCOUNTER — Encounter: Payer: Self-pay | Admitting: Cardiovascular Disease

## 2019-10-31 ENCOUNTER — Other Ambulatory Visit: Payer: Self-pay

## 2019-10-31 ENCOUNTER — Ambulatory Visit (INDEPENDENT_AMBULATORY_CARE_PROVIDER_SITE_OTHER): Payer: BC Managed Care – PPO | Admitting: Cardiovascular Disease

## 2019-10-31 VITALS — BP 144/64 | HR 84 | Ht 64.0 in | Wt 185.0 lb

## 2019-10-31 DIAGNOSIS — N1831 Chronic kidney disease, stage 3a: Secondary | ICD-10-CM

## 2019-10-31 DIAGNOSIS — I10 Essential (primary) hypertension: Secondary | ICD-10-CM | POA: Diagnosis not present

## 2019-10-31 DIAGNOSIS — Z5181 Encounter for therapeutic drug level monitoring: Secondary | ICD-10-CM | POA: Diagnosis not present

## 2019-10-31 DIAGNOSIS — I160 Hypertensive urgency: Secondary | ICD-10-CM

## 2019-10-31 MED ORDER — LABETALOL HCL 200 MG PO TABS: ORAL_TABLET | ORAL | 1 refills | Status: DC

## 2019-10-31 MED ORDER — LABETALOL HCL 200 MG PO TABS: 200.0000 mg | ORAL_TABLET | Freq: Two times a day (BID) | ORAL | 1 refills | Status: DC

## 2019-10-31 MED ORDER — HYDROCHLOROTHIAZIDE 25 MG PO TABS
25.0000 mg | ORAL_TABLET | Freq: Every day | ORAL | 3 refills | Status: DC
Start: 2019-10-31 — End: 2020-02-06

## 2019-10-31 NOTE — Progress Notes (Signed)
Hypertension Clinic Initial Assessment:    Date:  11/01/2019   ID:  CHIANN [name], DOB Apr 27, 1956, MRN [id number]  PCP:  Patient, No Pcp Per  Cardiologist:  No primary care provider on file.  Nephrologist:  Referring MD: Parke Poisson, MD   CC: Hypertension  History of Present Illness:    April [name] is a 64 y.o. female with a hx of hypertension, chronic diastolic heart failure and obesity here to establish care in the hypertension clinic. She was first diagnosed approximately 10 years ago.  She is always struggled to control it.  She has had multiple hospitalizations for hypertensive urgency.  The most recent was 10/07/2019.  Hydralazine was increased and she was started on labetalol instead of metoprolol.  She had renal artery Dopplers that showed 1 to 59% stenosis on the right and normal on the left.  A.m. cortisol was within normal limits.  Renal and aldosterone levels were not consistent with hyperaldosteronism.  She also had a hospitalization 08/2019.  During that hospitalization her home hydrochlorothiazide and lisinopril were held due to a slight increase in her creatinine.  She saw Dr. Milas Gain on 6/24 and was started on irbesartan.  Since then her BP has still been high at home but labile.  She thinks that her blood pressure is related to stress.  It has been as high as the 170s over 60s.  She took her irbesartan and then rechecked it several hours later and there was no change.  Overall she follows a DASH diet and limits her sodium intake to 1500 mg daily.  She does not think she is particularly salt sensitive.  She has no caffeine or alcohol in her diet.  She snores but does not have apnea.  She does not feel rested in the morning, but she thinks this is because she does not sleep for many hours.  She has no daytime somnolence.  She has not been exercising lately because her blood pressure has not been controlled and she is afraid it will go very high.  She notes that overall her  blood pressure tends to be higher in the mornings and worse later in the day.  She struggles with palpitations and thinks this may also be stress related.  Her husband has coronary artery disease and has had prior heart attacks.  Her mother-in-law is in a long-term care facility and finding it.  Her mother and sister both had strokes with residual deficits.  She helps to try and take care of all of them.  Previous antihypertensives: Metoprolol Labetalol Clonidine Lisinopril/hctz amlodipine  Past Medical History:  Diagnosis Date  . Anxiety   . CKD (chronic kidney disease) stage 3, GFR 30-59 ml/min 11/01/2019  . Hypertension   . Iron (Fe) deficiency anemia     Past Surgical History:  Procedure Laterality Date  . CESAREAN SECTION      Current Medications: Current Meds  Medication Sig  . Ensure (ENSURE) Take 237 mLs by mouth daily as needed (for supplementation).  . irbesartan (AVAPRO) 75 MG tablet Take 1 tablet (75 mg total) by mouth daily.  Marland Kitchen labetalol (NORMODYNE) 200 MG tablet Take 1 tablet (200 mg total) by mouth 2 (two) times daily.  . [DISCONTINUED] hydrALAZINE (APRESOLINE) 50 MG tablet Take 1 tablet (50 mg total) by mouth 3 (three) times daily.  . [DISCONTINUED] labetalol (NORMODYNE) 100 MG tablet Take 2 in the morning and 1 at bedtime  . [DISCONTINUED] labetalol (NORMODYNE) 200 MG tablet Take 2  in the morning and 1 at bedtime     Allergies:   Amlodipine, Aspirin, Clonidine derivatives, Darvon [propoxyphene hcl], Lisinopril, Toprol xl [metoprolol], and Valium [diazepam]   Social History   Socioeconomic History  . Marital status: Married    Spouse name: Not on file  . Number of children: Not on file  . Years of education: Not on file  . Highest education level: Not on file  Occupational History  . Occupation: retired  Tobacco Use  . Smoking status: Never Smoker  . Smokeless tobacco: Never Used  Substance and Sexual Activity  . Alcohol use: Not Currently    Comment:  rare  . Drug use: Never  . Sexual activity: Not on file  Other Topics Concern  . Not on file  Social History Narrative  . Not on file   Social Determinants of Health   Financial Resource Strain:   . Difficulty of Paying Living Expenses:   Food Insecurity:   . Worried About Programme researcher, broadcasting/film/video in the Last Year:   . Barista in the Last Year:   Transportation Needs:   . Freight forwarder (Medical):   Marland Kitchen Lack of Transportation (Non-Medical):   Physical Activity:   . Days of Exercise per Week:   . Minutes of Exercise per Session:   Stress:   . Feeling of Stress :   Social Connections:   . Frequency of Communication with Friends and Family:   . Frequency of Social Gatherings with Friends and Family:   . Attends Religious Services:   . Active Member of Clubs or Organizations:   . Attends Banker Meetings:   Marland Kitchen Marital Status:      Family History: The patient's family history includes CVA in her mother and sister; Hypertension in her brother, maternal grandmother, mother, and sister. There is no history of Kidney failure.  ROS:   Please see the history of present illness.     All other systems reviewed and are negative.  EKGs/Labs/Other Studies Reviewed:    EKG:  EKG is not ordered today.  The ekg ordered 10/08/19 demonstrates sinus tachycardia.  Rate 122 bpm.    Recent Labs: 09/29/2019: Magnesium 1.9; TSH 1.006 10/06/2019: ALT 12; B Natriuretic Peptide 37.7 10/08/2019: Hemoglobin 13.0; Platelets 299 10/29/2019: BUN 14; Creatinine, Ser 1.02; Potassium 4.5; Sodium 141   Recent Lipid Panel No results found for: CHOL, TRIG, HDL, CHOLHDL, VLDL, LDLCALC, LDLDIRECT  Physical Exam:    VS:  BP (!) 144/64 (BP Location: Left Arm)   Pulse 84   Ht 5\' 4"  (1.626 m)   Wt 185 lb (83.9 kg)   SpO2 98%   BMI 31.76 kg/m  , BMI Body mass index is 31.76 kg/m. GENERAL:  Well appearing HEENT: Pupils equal round and reactive, fundi not visualized, oral mucosa  unremarkable NECK:  No jugular venous distention, waveform within normal limits, carotid upstroke brisk and symmetric, no bruits LUNGS:  Clear to auscultation bilaterally HEART:  RRR.  PMI not displaced or sustained,S1 and S2 within normal limits, no S3, no S4, no clicks, no rubs, no murmurs ABD:  Flat, positive bowel sounds normal in frequency in pitch, no bruits, no rebound, no guarding, no midline pulsatile mass, no hepatomegaly, no splenomegaly EXT:  2 plus pulses throughout, no edema, no cyanosis no clubbing SKIN:  No rashes no nodules NEURO:  Cranial nerves II through XII grossly intact, motor grossly intact throughout PSYCH:  Cognitively intact, oriented to person place and  time   ASSESSMENT:    1. Essential hypertension   2. Hypertensive urgency   3. Therapeutic drug monitoring   4. Stage 3a chronic kidney disease     PLAN:    # Essential hypertension:  Her blood pressure was initially well-controlled but high on repeat.  She notes that it has been labile at home.  She previously had mild renal insufficiency and her diuretic was discontinued.  She would like to restart one and I think this is very reasonable.  Most recent renal function was within normal limits.  We will restart hydrochlorothiazide 25 mg daily.  She will need to have a basic metabolic panel checked in 1 week.  Stop hydralazine.  We may need to add the hydralazine back or increase her irbesartan if renal function permits.  We will also increase labetalol to 200 mg twice daily.  She has already had a full work-up for secondary causes of hypertension as listed below.  She is going to start increasing her exercise back to 150 minutes weekly.  She does not think she needs assistance from the prep program.  She consents to be monitored in our remote patient monitoring program through [employer].  She will track her blood pressure twice daily and understands that these trends will help [place] to adjust her medications as needed prior  to his next appointment.   Secondary Causes of Hypertension  Medications/Herbal: OCP, steroids, stimulants, antidepressants, weight loss medication, immune suppressants, NSAIDs, sympathomimetics, alcohol, caffeine, licorice, ginseng, St. John's wort, chemo (none apply) Sleep Apnea (testing not indicated) Renal artery stenosis (1-59% on R 10/2019.  L normal) Hyperaldosteronism (negative 10/2019) Hyper/hypothyroidism (TSH normal 08/2019) Pheochromocytoma: (negative 10/2019) Cushing's syndrome: cortisol 7.9 Coarctation of the aorta (symmetric BP)  Disposition:    FU with MD/PharmD in 1 month    Medication Adjustments/Labs and Tests Ordered: Current medicines are reviewed at length with the patient today.  Concerns regarding medicines are outlined above.  Orders Placed This Encounter  Procedures  . Basic metabolic panel   Meds ordered this encounter  Medications  . DISCONTD: labetalol (NORMODYNE) 200 MG tablet    Sig: Take 2 in the morning and 1 at bedtime    Dispense:  180 tablet    Refill:  1    NEW DOSE, D/C 100 MG RX  . labetalol (NORMODYNE) 200 MG tablet    Sig: Take 1 tablet (200 mg total) by mouth 2 (two) times daily.    Dispense:  180 tablet    Refill:  1    NEW DOSE, D/C 100 MG RX AND PREVIOUS RX  . hydrochlorothiazide (HYDRODIURIL) 25 MG tablet    Sig: Take 1 tablet (25 mg total) by mouth daily.    Dispense:  90 tablet    Refill:  3     Signed, Chilton Si, MD  11/01/2019 10:07 AM    [facility]

## 2019-10-31 NOTE — Patient Instructions (Addendum)
Medication Instructions:  STOP HYDRALAZINE   START HYDROCHLOROTHIAZIDE 25 MG DAILY   INCREASE LABETALOL TO 200 MG TWICE A DAY    Labwork: BMET IN 1 WEEK    Testing/Procedures: NONE   Follow-Up:  12/10/2019 AT 8:00 AM PHARM D    YOUR FOLLOW UP NEXT WEEK WITH K LAWRENCE HAS BEEN CANCELLED   Special Instructions:   OK TO RESUME EXERCISE   CALL [phone] TO GET YOUR MONITOR CONNECTED WITH YOUR PHONE WHEN YOU GET HOME AND DOWNLOAD APP   DASH Eating Plan DASH stands for "Dietary Approaches to Stop Hypertension." The DASH eating plan is a healthy eating plan that has been shown to reduce high blood pressure (hypertension). It may also reduce your risk for type 2 diabetes, heart disease, and stroke. The DASH eating plan may also help with weight loss. What are tips for following this plan?  General guidelines  Avoid eating more than 2,300 mg (milligrams) of salt (sodium) a day. If you have hypertension, you may need to reduce your sodium intake to 1,500 mg a day.  Limit alcohol intake to no more than 1 drink a day for nonpregnant women and 2 drinks a day for men. One drink equals 12 oz of beer, 5 oz of wine, or 1 oz of hard liquor.  Work with your health care provider to maintain a healthy body weight or to lose weight. Ask what an ideal weight is for you.  Get at least 30 minutes of exercise that causes your heart to beat faster (aerobic exercise) most days of the week. Activities may include walking, swimming, or biking.  Work with your health care provider or diet and nutrition specialist (dietitian) to adjust your eating plan to your individual calorie needs. Reading food labels   Check food labels for the amount of sodium per serving. Choose foods with less than 5 percent of the Daily Value of sodium. Generally, foods with less than 300 mg of sodium per serving fit into this eating plan.  To find whole grains, look for the word "whole" as the first word in the ingredient  list. Shopping  Buy products labeled as "low-sodium" or "no salt added."  Buy fresh foods. Avoid canned foods and premade or frozen meals. Cooking  Avoid adding salt when cooking. Use salt-free seasonings or herbs instead of table salt or sea salt. Check with your health care provider or pharmacist before using salt substitutes.  Do not fry foods. Cook foods using healthy methods such as baking, boiling, grilling, and broiling instead.  Cook with heart-healthy oils, such as olive, canola, soybean, or sunflower oil. Meal planning  Eat a balanced diet that includes: ? 5 or more servings of fruits and vegetables each day. At each meal, try to fill half of your plate with fruits and vegetables. ? Up to 6-8 servings of whole grains each day. ? Less than 6 oz of lean meat, poultry, or fish each day. A 3-oz serving of meat is about the same size as a deck of cards. One egg equals 1 oz. ? 2 servings of low-fat dairy each day. ? A serving of nuts, seeds, or beans 5 times each week. ? Heart-healthy fats. Healthy fats called Omega-3 fatty acids are found in foods such as flaxseeds and coldwater fish, like sardines, salmon, and mackerel.  Limit how much you eat of the following: ? Canned or prepackaged foods. ? Food that is high in trans fat, such as fried foods. ? Food that is high  in saturated fat, such as fatty meat. ? Sweets, desserts, sugary drinks, and other foods with added sugar. ? Full-fat dairy products.  Do not salt foods before eating.  Try to eat at least 2 vegetarian meals each week.  Eat more home-cooked food and less restaurant, buffet, and fast food.  When eating at a restaurant, ask that your food be prepared with less salt or no salt, if possible. What foods are recommended? The items listed may not be a complete list. Talk with your dietitian about what dietary choices are best for you. Grains Whole-grain or whole-wheat bread. Whole-grain or whole-wheat pasta. Brown  rice. [place]. Bulgur. Whole-grain and low-sodium cereals. Pita bread. Low-fat, low-sodium crackers. Whole-wheat flour tortillas. Vegetables Fresh or frozen vegetables (raw, steamed, roasted, or grilled). Low-sodium or reduced-sodium tomato and vegetable juice. Low-sodium or reduced-sodium tomato sauce and tomato paste. Low-sodium or reduced-sodium canned vegetables. Fruits All fresh, dried, or frozen fruit. Canned fruit in natural juice (without added sugar). Meat and other protein foods Skinless chicken or [place]. Ground chicken or [place]. Pork with fat trimmed off. Fish and seafood. Egg whites. Dried beans, peas, or lentils. Unsalted nuts, nut butters, and seeds. Unsalted canned beans. Lean cuts of beef with fat trimmed off. Low-sodium, lean deli meat. Dairy Low-fat (1%) or fat-free (skim) milk. Fat-free, low-fat, or reduced-fat cheeses. Nonfat, low-sodium ricotta or cottage cheese. Low-fat or nonfat yogurt. Low-fat, low-sodium cheese. Fats and oils Soft margarine without trans fats. Vegetable oil. Low-fat, reduced-fat, or light mayonnaise and salad dressings (reduced-sodium). Canola, safflower, olive, soybean, and sunflower oils. Avocado. Seasoning and other foods Herbs. Spices. Seasoning mixes without salt. Unsalted popcorn and pretzels. Fat-free sweets. What foods are not recommended? The items listed may not be a complete list. Talk with your dietitian about what dietary choices are best for you. Grains Baked goods made with fat, such as croissants, muffins, or some breads. Dry pasta or rice meal packs. Vegetables Creamed or fried vegetables. Vegetables in a cheese sauce. Regular canned vegetables (not low-sodium or reduced-sodium). Regular canned tomato sauce and paste (not low-sodium or reduced-sodium). Regular tomato and vegetable juice (not low-sodium or reduced-sodium). Angie Fava. Olives. Fruits Canned fruit in a light or heavy syrup. Fried fruit. Fruit in cream or butter  sauce. Meat and other protein foods Fatty cuts of meat. Ribs. Fried meat. Berniece Salines. Sausage. Bologna and other processed lunch meats. Salami. Fatback. Hotdogs. Bratwurst. Salted nuts and seeds. Canned beans with added salt. Canned or smoked fish. Whole eggs or egg yolks. Chicken or [place] with skin. Dairy Whole or 2% milk, cream, and half-and-half. Whole or full-fat cream cheese. Whole-fat or sweetened yogurt. Full-fat cheese. Nondairy creamers. Whipped toppings. Processed cheese and cheese spreads. Fats and oils Butter. Stick margarine. Lard. Shortening. Ghee. Bacon fat. Tropical oils, such as coconut, palm kernel, or palm oil. Seasoning and other foods Salted popcorn and pretzels. Onion salt, garlic salt, seasoned salt, table salt, and sea salt. Worcestershire sauce. Tartar sauce. Barbecue sauce. Teriyaki sauce. Soy sauce, including reduced-sodium. Steak sauce. Canned and packaged gravies. Fish sauce. Oyster sauce. Cocktail sauce. Horseradish that you find on the shelf. Ketchup. Mustard. Meat flavorings and tenderizers. Bouillon cubes. Hot sauce and Tabasco sauce. Premade or packaged marinades. Premade or packaged taco seasonings. Relishes. Regular salad dressings. Where to find more information:  National Heart, Lung, and [place]: https://wilson-eaton.com/  American Heart Association: www.heart.org Summary  The DASH eating plan is a healthy eating plan that has been shown to reduce high blood pressure (hypertension). It may  also reduce your risk for type 2 diabetes, heart disease, and stroke.  With the DASH eating plan, you should limit salt (sodium) intake to 2,300 mg a day. If you have hypertension, you may need to reduce your sodium intake to 1,500 mg a day.  When on the DASH eating plan, aim to eat more fresh fruits and vegetables, whole grains, lean proteins, low-fat dairy, and heart-healthy fats.  Work with your health care provider or diet and nutrition specialist (dietitian) to adjust  your eating plan to your individual calorie needs. This information is not intended to replace advice given to you by your health care provider. Make sure you discuss any questions you have with your health care provider. Document Released: 04/07/2011 Document Revised: 03/31/2017 Document Reviewed: 04/11/2016 Elsevier Patient Education  2020 [employer].

## 2019-11-01 ENCOUNTER — Encounter: Payer: Self-pay | Admitting: Cardiovascular Disease

## 2019-11-01 DIAGNOSIS — N183 Chronic kidney disease, stage 3 unspecified: Secondary | ICD-10-CM

## 2019-11-01 HISTORY — DX: Chronic kidney disease, stage 3 unspecified: N18.30

## 2019-11-06 ENCOUNTER — Ambulatory Visit: Payer: BC Managed Care – PPO | Admitting: Adult Health

## 2019-11-07 DIAGNOSIS — Z5181 Encounter for therapeutic drug level monitoring: Secondary | ICD-10-CM | POA: Diagnosis not present

## 2019-11-07 DIAGNOSIS — I160 Hypertensive urgency: Secondary | ICD-10-CM | POA: Diagnosis not present

## 2019-11-08 LAB — BASIC METABOLIC PANEL
BUN/Creatinine Ratio: 17 (ref 12–28)
BUN: 19 mg/dL (ref 8–27)
CO2: 23 mmol/L (ref 20–29)
Calcium: 10.4 mg/dL — ABNORMAL HIGH (ref 8.7–10.3)
Chloride: 102 mmol/L (ref 96–106)
Creatinine, Ser: 1.11 mg/dL — ABNORMAL HIGH (ref 0.57–1.00)
GFR calc Af Amer: 61 mL/min/{1.73_m2} (ref >59)
GFR calc non Af Amer: 53 mL/min/{1.73_m2} — ABNORMAL LOW (ref >59)
Glucose: 85 mg/dL (ref 65–99)
Potassium: 4.2 mmol/L (ref 3.5–5.2)
Sodium: 143 mmol/L (ref 134–144)

## 2019-11-11 ENCOUNTER — Encounter: Payer: Self-pay | Admitting: Family Medicine

## 2019-11-11 ENCOUNTER — Other Ambulatory Visit: Payer: Self-pay

## 2019-11-11 ENCOUNTER — Ambulatory Visit: Payer: BC Managed Care – PPO | Attending: Family Medicine | Admitting: Family Medicine

## 2019-11-11 VITALS — BP 139/71 | HR 79 | Ht 64.0 in | Wt 185.6 lb

## 2019-11-11 DIAGNOSIS — I10 Essential (primary) hypertension: Secondary | ICD-10-CM | POA: Diagnosis not present

## 2019-11-11 DIAGNOSIS — I5032 Chronic diastolic (congestive) heart failure: Secondary | ICD-10-CM

## 2019-11-11 DIAGNOSIS — I1A0 Resistant hypertension: Secondary | ICD-10-CM

## 2019-11-11 NOTE — Patient Instructions (Signed)
DASH Eating Plan DASH stands for "Dietary Approaches to Stop Hypertension." The DASH eating plan is a healthy eating plan that has been shown to reduce high blood pressure (hypertension). It may also reduce your risk for type 2 diabetes, heart disease, and stroke. The DASH eating plan may also help with weight loss. What are tips for following this plan?  General guidelines  Avoid eating more than 2,300 mg (milligrams) of salt (sodium) a day. If you have hypertension, you may need to reduce your sodium intake to 1,500 mg a day.  Limit alcohol intake to no more than 1 drink a day for nonpregnant women and 2 drinks a day for men. One drink equals 12 oz of beer, 5 oz of wine, or 1 oz of hard liquor.  Work with your health care provider to maintain a healthy body weight or to lose weight. Ask what an ideal weight is for you.  Get at least 30 minutes of exercise that causes your heart to beat faster (aerobic exercise) most days of the week. Activities may include walking, swimming, or biking.  Work with your health care provider or diet and nutrition specialist (dietitian) to adjust your eating plan to your individual calorie needs. Reading food labels   Check food labels for the amount of sodium per serving. Choose foods with less than 5 percent of the Daily Value of sodium. Generally, foods with less than 300 mg of sodium per serving fit into this eating plan.  To find whole grains, look for the word "whole" as the first word in the ingredient list. Shopping  Buy products labeled as "low-sodium" or "no salt added."  Buy fresh foods. Avoid canned foods and premade or frozen meals. Cooking  Avoid adding salt when cooking. Use salt-free seasonings or herbs instead of table salt or sea salt. Check with your health care provider or pharmacist before using salt substitutes.  Do not fry foods. Cook foods using healthy methods such as baking, boiling, grilling, and broiling instead.  Cook with  heart-healthy oils, such as olive, canola, soybean, or sunflower oil. Meal planning  Eat a balanced diet that includes: ? 5 or more servings of fruits and vegetables each day. At each meal, try to fill half of your plate with fruits and vegetables. ? Up to 6-8 servings of whole grains each day. ? Less than 6 oz of lean meat, poultry, or fish each day. A 3-oz serving of meat is about the same size as a deck of cards. One egg equals 1 oz. ? 2 servings of low-fat dairy each day. ? A serving of nuts, seeds, or beans 5 times each week. ? Heart-healthy fats. Healthy fats called Omega-3 fatty acids are found in foods such as flaxseeds and coldwater fish, like sardines, salmon, and mackerel.  Limit how much you eat of the following: ? Canned or prepackaged foods. ? Food that is high in trans fat, such as fried foods. ? Food that is high in saturated fat, such as fatty meat. ? Sweets, desserts, sugary drinks, and other foods with added sugar. ? Full-fat dairy products.  Do not salt foods before eating.  Try to eat at least 2 vegetarian meals each week.  Eat more home-cooked food and less restaurant, buffet, and fast food.  When eating at a restaurant, ask that your food be prepared with less salt or no salt, if possible. What foods are recommended? The items listed may not be a complete list. Talk with your dietitian about   what dietary choices are best for you. Grains Whole-grain or whole-wheat bread. Whole-grain or whole-wheat pasta. Brown rice. Oatmeal. Quinoa. Bulgur. Whole-grain and low-sodium cereals. Pita bread. Low-fat, low-sodium crackers. Whole-wheat flour tortillas. Vegetables Fresh or frozen vegetables (raw, steamed, roasted, or grilled). Low-sodium or reduced-sodium tomato and vegetable juice. Low-sodium or reduced-sodium tomato sauce and tomato paste. Low-sodium or reduced-sodium canned vegetables. Fruits All fresh, dried, or frozen fruit. Canned fruit in natural juice (without  added sugar). Meat and other protein foods Skinless chicken or turkey. Ground chicken or turkey. Pork with fat trimmed off. Fish and seafood. Egg whites. Dried beans, peas, or lentils. Unsalted nuts, nut butters, and seeds. Unsalted canned beans. Lean cuts of beef with fat trimmed off. Low-sodium, lean deli meat. Dairy Low-fat (1%) or fat-free (skim) milk. Fat-free, low-fat, or reduced-fat cheeses. Nonfat, low-sodium ricotta or cottage cheese. Low-fat or nonfat yogurt. Low-fat, low-sodium cheese. Fats and oils Soft margarine without trans fats. Vegetable oil. Low-fat, reduced-fat, or light mayonnaise and salad dressings (reduced-sodium). Canola, safflower, olive, soybean, and sunflower oils. Avocado. Seasoning and other foods Herbs. Spices. Seasoning mixes without salt. Unsalted popcorn and pretzels. Fat-free sweets. What foods are not recommended? The items listed may not be a complete list. Talk with your dietitian about what dietary choices are best for you. Grains Baked goods made with fat, such as croissants, muffins, or some breads. Dry pasta or rice meal packs. Vegetables Creamed or fried vegetables. Vegetables in a cheese sauce. Regular canned vegetables (not low-sodium or reduced-sodium). Regular canned tomato sauce and paste (not low-sodium or reduced-sodium). Regular tomato and vegetable juice (not low-sodium or reduced-sodium). Pickles. Olives. Fruits Canned fruit in a light or heavy syrup. Fried fruit. Fruit in cream or butter sauce. Meat and other protein foods Fatty cuts of meat. Ribs. Fried meat. Bacon. Sausage. Bologna and other processed lunch meats. Salami. Fatback. Hotdogs. Bratwurst. Salted nuts and seeds. Canned beans with added salt. Canned or smoked fish. Whole eggs or egg yolks. Chicken or turkey with skin. Dairy Whole or 2% milk, cream, and half-and-half. Whole or full-fat cream cheese. Whole-fat or sweetened yogurt. Full-fat cheese. Nondairy creamers. Whipped toppings.  Processed cheese and cheese spreads. Fats and oils Butter. Stick margarine. Lard. Shortening. Ghee. Bacon fat. Tropical oils, such as coconut, palm kernel, or palm oil. Seasoning and other foods Salted popcorn and pretzels. Onion salt, garlic salt, seasoned salt, table salt, and sea salt. Worcestershire sauce. Tartar sauce. Barbecue sauce. Teriyaki sauce. Soy sauce, including reduced-sodium. Steak sauce. Canned and packaged gravies. Fish sauce. Oyster sauce. Cocktail sauce. Horseradish that you find on the shelf. Ketchup. Mustard. Meat flavorings and tenderizers. Bouillon cubes. Hot sauce and Tabasco sauce. Premade or packaged marinades. Premade or packaged taco seasonings. Relishes. Regular salad dressings. Where to find more information:  National Heart, Lung, and Blood Institute: www.nhlbi.nih.gov  American Heart Association: www.heart.org Summary  The DASH eating plan is a healthy eating plan that has been shown to reduce high blood pressure (hypertension). It may also reduce your risk for type 2 diabetes, heart disease, and stroke.  With the DASH eating plan, you should limit salt (sodium) intake to 2,300 mg a day. If you have hypertension, you may need to reduce your sodium intake to 1,500 mg a day.  When on the DASH eating plan, aim to eat more fresh fruits and vegetables, whole grains, lean proteins, low-fat dairy, and heart-healthy fats.  Work with your health care provider or diet and nutrition specialist (dietitian) to adjust your eating plan to your   individual calorie needs. This information is not intended to replace advice given to you by your health care provider. Make sure you discuss any questions you have with your health care provider. Document Revised: 03/31/2017 Document Reviewed: 04/11/2016 Elsevier Patient Education  2020 Elsevier Inc.  

## 2019-11-11 NOTE — Progress Notes (Signed)
State that BP has still been fluctuating.   States that she hears thumps in her ears.

## 2019-11-11 NOTE — Progress Notes (Signed)
Subjective:  Patient ID: April [name], female    DOB: 1956/01/12  Age: 64 y.o. MRN: [id number]  CC: Hypertension and Establish Care   HPI April [name] is a 64 year old female with a history of Resistant Hypertension, HFpEF (EF 70 to 75%, grade 1 DD) here to establish care. She had a hospitalization for hypertensive crisis in 10/2018 and since then has followed up with cardiology for management of her hypertension with her last visit occurring 11 days ago.  Previously followed by Dr Ashley Royalty who retired and she currently has no PCP.  She noticed on one occasion she had a 100/48 BP after exercising one day and on another day it was in the 140s systolic She feels like she can hear something beating in her ears and it wakes her up at night; denies presence of tinnitus. She also hears her heart race and it wakes her up as well. Labs reviewed indicate normal thyroid panel from 08/2019, EKG from hospitalization reveals sinus tachycardia, biatrial enlargement, cannot rule out anterior infarct. Of note her heart rate is normal in the clinic. She has not had a complete physical exam in a while. Past Medical History:  Diagnosis Date  . Anxiety   . CKD (chronic kidney disease) stage 3, GFR 30-59 ml/min 11/01/2019  . Hypertension   . Iron (Fe) deficiency anemia     Past Surgical History:  Procedure Laterality Date  . CESAREAN SECTION      Family History  Problem Relation Age of Onset  . CVA Mother   . Hypertension Mother   . Hypertension Sister   . CVA Sister   . Hypertension Brother   . Hypertension Maternal Grandmother   . Kidney failure Neg Hx     Allergies  Allergen Reactions  . Amlodipine Other (See Comments)    "Dental issues"   . Aspirin Hives  . Clonidine Derivatives Other (See Comments)    Unknown reaction  . Darvon [Propoxyphene Hcl] Other (See Comments)    Dizziness  . Lisinopril Other (See Comments)    Gout   . Toprol Xl [Metoprolol] Other (See Comments)     Nightmares   . Valium [Diazepam] Other (See Comments)    Dizziness    Outpatient Medications Prior to Visit  Medication Sig Dispense Refill  . Ensure (ENSURE) Take 237 mLs by mouth daily as needed (for supplementation).    . hydrochlorothiazide (HYDRODIURIL) 25 MG tablet Take 1 tablet (25 mg total) by mouth daily. 90 tablet 3  . irbesartan (AVAPRO) 75 MG tablet Take 1 tablet (75 mg total) by mouth daily. 30 tablet 6  . labetalol (NORMODYNE) 200 MG tablet Take 1 tablet (200 mg total) by mouth 2 (two) times daily. 180 tablet 1   No facility-administered medications prior to visit.     ROS Review of Systems  Constitutional: Negative for activity change, appetite change and fatigue.  HENT: Negative for congestion, sinus pressure and sore throat.   Eyes: Negative for visual disturbance.  Respiratory: Negative for cough, chest tightness, shortness of breath and wheezing.   Cardiovascular: Negative for chest pain and palpitations.  Gastrointestinal: Negative for abdominal distention, abdominal pain and constipation.  Endocrine: Negative for polydipsia.  Genitourinary: Negative for dysuria and frequency.  Musculoskeletal: Negative for arthralgias and back pain.  Skin: Negative for rash.  Neurological: Negative for tremors, light-headedness and numbness.  Hematological: Does not bruise/bleed easily.  Psychiatric/Behavioral: Negative for agitation and behavioral problems.    Objective:  BP 139/71  Pulse 79   Ht 5\' 4"  (1.626 m)   Wt 185 lb 9.6 oz (84.2 kg)   SpO2 98%   BMI 31.86 kg/m   BP/Weight 11/11/2019 10/31/2019 10/24/2019  Systolic BP 139 144 182  Diastolic BP 71 64 78  Wt. (Lbs) 185.6 185 189  BMI 31.86 31.76 32.44      Physical Exam Constitutional:      Appearance: She is well-developed.  Neck:     Vascular: No JVD.  Cardiovascular:     Rate and Rhythm: Normal rate.     Heart sounds: Normal heart sounds. No murmur heard.   Pulmonary:     Effort: Pulmonary  effort is normal.     Breath sounds: Normal breath sounds. No wheezing or rales.  Chest:     Chest wall: No tenderness.  Abdominal:     General: Bowel sounds are normal. There is no distension.     Palpations: Abdomen is soft. There is no mass.     Tenderness: There is no abdominal tenderness.  Musculoskeletal:        General: Normal range of motion.     Right lower leg: No edema.     Left lower leg: No edema.  Neurological:     Mental Status: She is alert and oriented to person, place, and time.  Psychiatric:        Mood and Affect: Mood normal.     CMP Latest Ref Rng & Units 11/07/2019 10/29/2019 10/08/2019  Glucose 65 - 99 mg/dL 85 88 12/08/2019)  BUN 8 - 27 mg/dL 19 14 7(L)  Creatinine 0.57 - 1.00 mg/dL 440(H) 4.74(Q) 5.95(G  Sodium 134 - 144 mmol/L 143 141 140  Potassium 3.5 - 5.2 mmol/L 4.2 4.5 3.5  Chloride 96 - 106 mmol/L 102 105 105  CO2 20 - 29 mmol/L 23 22 23   Calcium 8.7 - 10.3 mg/dL 10.4(H) 9.9 9.8  Total Protein 6.5 - 8.1 g/dL - - -  Total Bilirubin 0.3 - 1.2 mg/dL - - -  Alkaline Phos 38 - 126 U/L - - -  AST 15 - 41 U/L - - -  ALT 0 - 44 U/L - - -    Lipid Panel  No results found for: CHOL, TRIG, HDL, CHOLHDL, VLDL, LDLCALC, LDLDIRECT  CBC    Component Value Date/Time   WBC 6.0 10/08/2019 2012   RBC 4.42 10/08/2019 2012   HGB 13.0 10/08/2019 2012   HCT 39.3 10/08/2019 2012   PLT 299 10/08/2019 2012   MCV 88.9 10/08/2019 2012   MCH 29.4 10/08/2019 2012   MCHC 33.1 10/08/2019 2012   RDW 12.1 10/08/2019 2012   LYMPHSABS 1.7 10/06/2019 1009   MONOABS 0.5 10/06/2019 1009   EOSABS 0.1 10/06/2019 1009   BASOSABS 0.0 10/06/2019 1009    Lab Results  Component Value Date   HGBA1C 4.9 10/06/2019    Assessment & Plan:  1. Chronic diastolic CHF (congestive heart failure) (HCC) EF of 70-75% Euvolemic Risk factor modification including blood pressure control We will send of lipid panel at next visit when she is fasting  2. Resistant hypertension She is  currently controlled on her current regimen Advised that she could be exhibiting some symptoms of hyperdynamic circulation due to previous persistently elevated hypertension but will need to discuss with her cardiologist as well Counseled on blood pressure goal of less than 130/80, low-sodium, DASH diet, medication compliance, 150 minutes of moderate intensity exercise per week. Discussed medication compliance, adverse effects.  Follow-up: Return in about 1 month (around 12/12/2019) for complete physical exam.  Along with fasting labs      Hoy Register, MD, FAAFP. [facility] [place], [place] [phone]   11/11/2019, 9:54 AM

## 2019-11-18 ENCOUNTER — Telehealth: Payer: Self-pay | Admitting: Pharmacist

## 2019-11-18 NOTE — Telephone Encounter (Signed)
Patient question was related to 1 low BP reading of 100/48 obtained last week. Patient denied dizziness or any other symptoms. (no problems with cost).  Recommended to continue therapy without changes and monitor BP twice daily. If BP < 90 systolic, she can decrease labetalol to 100mg  until clinic follow up.

## 2019-11-18 NOTE — Telephone Encounter (Signed)
LMOM; patient to call back to discuss medication questions and issues with cost. Meds: HCTZ, irbesartan, labetalol.

## 2019-11-29 ENCOUNTER — Telehealth: Payer: Self-pay

## 2019-11-29 NOTE — Telephone Encounter (Signed)
Called patient to discuss survey response from [employer]. Left message for patient to return call.

## 2019-12-03 ENCOUNTER — Telehealth: Payer: Self-pay

## 2019-12-03 DIAGNOSIS — Z0000 Encounter for general adult medical examination without abnormal findings: Secondary | ICD-10-CM

## 2019-12-03 NOTE — Telephone Encounter (Signed)
Called patient to discuss [employer] survey results regarding affording medication(s). Patient does not have personal concerns regarding the cost of her medication(s) at this time. Patient is aware of potential resources available if needed.

## 2019-12-09 NOTE — Progress Notes (Signed)
12/10/2019 April [name] 09-28-1955 [id number]   HPI:  April [name] is a 64 y.o. female patient of Dr [name], who presents today for advanced hypertension clinic follow up.  In addition to hypertension, her medical history is significant for chronic diastolic HF and obesity.  She was seen by Dr. [name] in early July and found to have a blood pressure of 144/64, much improved from a prior visit with Dr. [name] in June, when she was at 182/78.  Patient notes that she has been hypertensive for about 10 years and has often struggled with control.   She reports multiple ER/hospital visits for hypertensive urgency.   See below for information on secondary evaluations.    Today she returns for her first follow up with PharmD.  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  We discussed both of these diagnoses at length and answered all questions.  We will draw cholesterol labs today, as she has none on record.  She also notes that since starting hydrochlorothiazide she tends to become dizzy or lightheaded when she goes out in the sun.   Lastly, she reports one episode of chest pain, lasting about 20 seconds yesterday.  States that it came on suddenly and was gone, without return, rather quickly.    Secondary Causes of Hypertension  Medications/Herbal: OCP, steroids, stimulants, antidepressants, weight loss medication, immune suppressants, NSAIDs, sympathomimetics, alcohol, caffeine, licorice, ginseng, St. John's wort, chemo (none apply) Sleep Apnea (testing not indicated) Renal artery stenosis (1-59% on R 10/2019.  L normal) Hyperaldosteronism (negative 10/2019) Hyper/hypothyroidism (TSH normal 08/2019) Pheochromocytoma: (negative 10/2019) Cushing's syndrome: cortisol 7.9 Coarctation of the aorta (symmetric BP)    Blood Pressure Goal:  130/80  Current Medications: irbesartan 75 mg, hctz 25mg , labetalol 200 mg  bid   Family Hx: mother paralyzed from stroke at 43, now 44; sister CABG at 36, cognitively challenged from stroke at 71, now 57: both with hypertension; brother CABG at 28, died at 21; 2 other siblings healthy; father passed at 51 from "natural causes"; 2 sons 32 and 68, neither with hypertension  Social Hx: no tobacco, no alcohol; now caffeine free cola, about 3 cans per week;   Diet:  Drinks mostly sparkling flavored water, some hot tea; following DASH diet; no added salt; lots of fresh fruits and vegetables,  Exercise:  Walking, aerobic and strength training 4-5 days a week   Home BP readings: from the [employer] app, last 14 days average 137/72, HR 84.  Range 126-151/60-89  Previously tried: metoprolol, labetalol, clonidine, lisinopril/hctz, amlodipine  Labs: 10/2018: Na 143, K 4.2, Glu 85, BUN 19, SCr 1.11 GFR 61  Wt Readings from Last 3 Encounters:  12/10/19 185 lb (83.9 kg)  11/11/19 185 lb 9.6 oz (84.2 kg)  10/31/19 185 lb (83.9 kg)   BP Readings from Last 3 Encounters:  12/10/19 (!) 152/78  11/11/19 139/71  10/31/19 (!) 144/64   Pulse Readings from Last 3 Encounters:  12/10/19 91  11/11/19 79  10/31/19 84    Current Outpatient Medications  Medication Sig Dispense Refill  . Ensure (ENSURE) Take 237 mLs by mouth daily as needed (for supplementation).    . hydrochlorothiazide (HYDRODIURIL) 25 MG tablet Take 1 tablet (25 mg total) by mouth daily. 90 tablet 3  . labetalol (NORMODYNE) 200 MG tablet Take 1 tablet (200 mg total) by mouth 2 (two) times daily. 180 tablet 1  No current facility-administered medications for this visit.    Allergies  Allergen Reactions  . Amlodipine Other (See Comments)    "Dental issues"   . Aspirin Hives  . Clonidine Derivatives Other (See Comments)    Unknown reaction  . Darvon [Propoxyphene Hcl] Other (See Comments)    Dizziness  . Lisinopril Other (See Comments)    Gout   . Toprol Xl [Metoprolol] Other (See Comments)     Nightmares   . Valium [Diazepam] Other (See Comments)    Dizziness    Past Medical History:  Diagnosis Date  . Anxiety   . CKD (chronic kidney disease) stage 3, GFR 30-59 ml/min 11/01/2019  . Hypertension   . Iron (Fe) deficiency anemia     Blood pressure (!) 152/78, pulse 91, resp. rate 15, height 5\' 4"  (1.626 m), weight 185 lb (83.9 kg), SpO2 96 %.  Hypertensive urgency Patient with essential hypertension, doing much better in past few weeks.  Will decrease hydrochlorothiazide to 12.5 mg and move to evenings, and increase irbesartan to 150 mg daily.  If this combination works for her we can get the combination tablet in the future.  She will continue to work on her healthy lifestyle goals and daily blood pressure monitoring thru [employer].  We will see her back in 1 month for second PharmD ADV HTN visit.    Also ordered lipid panel today, as patient was concerned about RAS noted for right renal artery.     PharmD CPP [facility] [address] [place], [place] [phone]

## 2019-12-10 ENCOUNTER — Other Ambulatory Visit: Payer: Self-pay

## 2019-12-10 ENCOUNTER — Ambulatory Visit (INDEPENDENT_AMBULATORY_CARE_PROVIDER_SITE_OTHER): Payer: BC Managed Care – PPO | Admitting: Pharmacist Clinician (PhC)/ Clinical Pharmacy Specialist

## 2019-12-10 VITALS — BP 152/78 | HR 91 | Resp 15 | Ht 64.0 in | Wt 185.0 lb

## 2019-12-10 DIAGNOSIS — I10 Essential (primary) hypertension: Secondary | ICD-10-CM | POA: Diagnosis not present

## 2019-12-10 DIAGNOSIS — Z5181 Encounter for therapeutic drug level monitoring: Secondary | ICD-10-CM | POA: Diagnosis not present

## 2019-12-10 DIAGNOSIS — I160 Hypertensive urgency: Secondary | ICD-10-CM | POA: Diagnosis not present

## 2019-12-10 DIAGNOSIS — N1831 Chronic kidney disease, stage 3a: Secondary | ICD-10-CM | POA: Diagnosis not present

## 2019-12-10 DIAGNOSIS — Z0000 Encounter for general adult medical examination without abnormal findings: Secondary | ICD-10-CM | POA: Diagnosis not present

## 2019-12-10 LAB — LIPID PANEL
Chol/HDL Ratio: 3.6 ratio (ref 0.0–4.4)
Cholesterol, Total: 158 mg/dL (ref 100–199)
HDL: 44 mg/dL (ref >39)
LDL Chol Calc (NIH): 96 mg/dL (ref 0–99)
Triglycerides: 99 mg/dL (ref 0–149)
VLDL Cholesterol Cal: 18 mg/dL (ref 5–40)

## 2019-12-10 LAB — HEPATIC FUNCTION PANEL
ALT: 10 IU/L (ref 0–32)
AST: 16 IU/L (ref 0–40)
Albumin: 4.5 g/dL (ref 3.8–4.8)
Alkaline Phosphatase: 120 IU/L (ref 48–121)
Bilirubin Total: 0.4 mg/dL (ref 0.0–1.2)
Bilirubin, Direct: 0.1 mg/dL (ref 0.00–0.40)
Total Protein: 7.3 g/dL (ref 6.0–8.5)

## 2019-12-10 MED ORDER — IRBESARTAN 150 MG PO TABS: 150.0000 mg | ORAL_TABLET | Freq: Every day | ORAL | 6 refills | Status: DC

## 2019-12-10 NOTE — Assessment & Plan Note (Signed)
Patient with essential hypertension, doing much better in past few weeks.  Will decrease hydrochlorothiazide to 12.5 mg and move to evenings, and increase irbesartan to 150 mg daily.  If this combination works for her we can get the combination tablet in the future.  She will continue to work on her healthy lifestyle goals and daily blood pressure monitoring thru [employer].  We will see her back in 1 month for second PharmD ADV HTN visit.    Also ordered lipid panel today, as patient was concerned about RAS noted for right renal artery.

## 2019-12-10 NOTE — Patient Instructions (Signed)
Return for a a follow up appointment September 9 at 8:30  Go to the lab today for cholesterol labs  Check your blood pressure at home daily and keep record of the readings.  Take your BP meds as follows:  Increase irbesartan to 150 mg once daily   Decrease hydrochlorothiazide to 12.5 mg (1/2 tablet) daily IN THE EVENINGS  Continue all other medications  Bring all of your meds, your BP cuff and your record of home blood pressures to your next appointment.  Exercise as you're able, try to walk approximately 30 minutes per day.  Keep salt intake to a minimum, especially watch canned and prepared boxed foods.  Eat more fresh fruits and vegetables and fewer canned items.  Avoid eating in fast food restaurants.    HOW TO TAKE YOUR BLOOD PRESSURE: . Rest 5 minutes before taking your blood pressure. .  Don't smoke or drink caffeinated beverages for at least 30 minutes before. . Take your blood pressure before (not after) you eat. . Sit comfortably with your back supported and both feet on the floor (don't cross your legs). . Elevate your arm to heart level on a table or a desk. . Use the proper sized cuff. It should fit smoothly and snugly around your bare upper arm. There should be enough room to slip a fingertip under the cuff. The bottom edge of the cuff should be 1 inch above the crease of the elbow. . Ideally, take 3 measurements at one sitting and record the average.

## 2019-12-11 ENCOUNTER — Ambulatory Visit: Payer: BC Managed Care – PPO | Attending: Family Medicine | Admitting: Family Medicine

## 2019-12-11 ENCOUNTER — Encounter: Payer: Self-pay | Admitting: Family Medicine

## 2019-12-11 ENCOUNTER — Other Ambulatory Visit (HOSPITAL_COMMUNITY)
Admission: RE | Admit: 2019-12-11 | Discharge: 2019-12-11 | Disposition: A | Discharge status: Home / Self Care | Payer: BC Managed Care – PPO | Source: Ambulatory Visit | Attending: Family Medicine | Admitting: Family Medicine

## 2019-12-11 VITALS — BP 147/67 | HR 71 | Ht 64.0 in | Wt 182.6 lb

## 2019-12-11 DIAGNOSIS — Z124 Encounter for screening for malignant neoplasm of cervix: Secondary | ICD-10-CM | POA: Diagnosis not present

## 2019-12-11 DIAGNOSIS — Z0000 Encounter for general adult medical examination without abnormal findings: Secondary | ICD-10-CM

## 2019-12-11 DIAGNOSIS — Z1231 Encounter for screening mammogram for malignant neoplasm of breast: Secondary | ICD-10-CM

## 2019-12-11 NOTE — Patient Instructions (Signed)
Health Maintenance, Female Adopting a healthy lifestyle and getting preventive care are important in promoting health and wellness. Ask your health care provider about:  The right schedule for you to have regular tests and exams.  Things you can do on your own to prevent diseases and keep yourself healthy. What should I know about diet, weight, and exercise? Eat a healthy diet   Eat a diet that includes plenty of vegetables, fruits, low-fat dairy products, and lean protein.  Do not eat a lot of foods that are high in solid fats, added sugars, or sodium. Maintain a healthy weight Body mass index (BMI) is used to identify weight problems. It estimates body fat based on height and weight. Your health care provider can help determine your BMI and help you achieve or maintain a healthy weight. Get regular exercise Get regular exercise. This is one of the most important things you can do for your health. Most adults should:  Exercise for at least 150 minutes each week. The exercise should increase your heart rate and make you sweat (moderate-intensity exercise).  Do strengthening exercises at least twice a week. This is in addition to the moderate-intensity exercise.  Spend less time sitting. Even light physical activity can be beneficial. Watch cholesterol and blood lipids Have your blood tested for lipids and cholesterol at 65 years of age, then have this test every 5 years. Have your cholesterol levels checked more often if:  Your lipid or cholesterol levels are high.  You are older than 65 years of age.  You are at high risk for heart disease. What should I know about cancer screening? Depending on your health history and family history, you may need to have cancer screening at various ages. This may include screening for:  Breast cancer.  Cervical cancer.  Colorectal cancer.  Skin cancer.  Lung cancer. What should I know about heart disease, diabetes, and high blood  pressure? Blood pressure and heart disease  High blood pressure causes heart disease and increases the risk of stroke. This is more likely to develop in people who have high blood pressure readings, are of African descent, or are overweight.  Have your blood pressure checked: ? Every 3-5 years if you are 18-39 years of age. ? Every year if you are 40 years old or older. Diabetes Have regular diabetes screenings. This checks your fasting blood sugar level. Have the screening done:  Once every three years after age 40 if you are at a normal weight and have a low risk for diabetes.  More often and at a younger age if you are overweight or have a high risk for diabetes. What should I know about preventing infection? Hepatitis B If you have a higher risk for hepatitis B, you should be screened for this virus. Talk with your health care provider to find out if you are at risk for hepatitis B infection. Hepatitis C Testing is recommended for:  Everyone born from 1945 through 1965.  Anyone with known risk factors for hepatitis C. Sexually transmitted infections (STIs)  Get screened for STIs, including gonorrhea and chlamydia, if: ? You are sexually active and are younger than 65 years of age. ? You are older than 65 years of age and your health care provider tells you that you are at risk for this type of infection. ? Your sexual activity has changed since you were last screened, and you are at increased risk for chlamydia or gonorrhea. Ask your health care provider if   you are at risk.  Ask your health care provider about whether you are at high risk for HIV. Your health care provider may recommend a prescription medicine to help prevent HIV infection. If you choose to take medicine to prevent HIV, you should first get tested for HIV. You should then be tested every 3 months for as long as you are taking the medicine. Pregnancy  If you are about to stop having your period (premenopausal) and  you may become pregnant, seek counseling before you get pregnant.  Take 400 to 800 micrograms (mcg) of folic acid every day if you become pregnant.  Ask for birth control (contraception) if you want to prevent pregnancy. Osteoporosis and menopause Osteoporosis is a disease in which the bones lose minerals and strength with aging. This can result in bone fractures. If you are 65 years old or older, or if you are at risk for osteoporosis and fractures, ask your health care provider if you should:  Be screened for bone loss.  Take a calcium or vitamin D supplement to lower your risk of fractures.  Be given hormone replacement therapy (HRT) to treat symptoms of menopause. Follow these instructions at home: Lifestyle  Do not use any products that contain nicotine or tobacco, such as cigarettes, e-cigarettes, and chewing tobacco. If you need help quitting, ask your health care provider.  Do not use street drugs.  Do not share needles.  Ask your health care provider for help if you need support or information about quitting drugs. Alcohol use  Do not drink alcohol if: ? Your health care provider tells you not to drink. ? You are pregnant, may be pregnant, or are planning to become pregnant.  If you drink alcohol: ? Limit how much you use to 0-1 drink a day. ? Limit intake if you are breastfeeding.  Be aware of how much alcohol is in your drink. In the [place] one drink equals one 12 oz bottle of beer (355 mL), one 5 oz glass of wine (148 mL), or one 1 oz glass of hard liquor (44 mL). General instructions  Schedule regular health, dental, and eye exams.  Stay current with your vaccines.  Tell your health care provider if: ? You often feel depressed. ? You have ever been abused or do not feel safe at home. Summary  Adopting a healthy lifestyle and getting preventive care are important in promoting health and wellness.  Follow your health care provider's instructions about healthy  diet, exercising, and getting tested or screened for diseases.  Follow your health care provider's instructions on monitoring your cholesterol and blood pressure. This information is not intended to replace advice given to you by your health care provider. Make sure you discuss any questions you have with your health care provider. Document Revised: 04/11/2018 Document Reviewed: 04/11/2018 Elsevier Patient Education  2020 Elsevier Inc.  

## 2019-12-11 NOTE — Progress Notes (Signed)
Subjective:  Patient ID: April [name], female    DOB: 04-20-1956  Age: 64 y.o. MRN: [id number]  CC: Annual Exam and Gynecologic Exam   HPI NOU CHARD presents for an annual physical exam She is due for Pap smear, mammogram and colonoscopy. She has a regular gastroenterologist and will be contacting him to schedule her colonoscopy  Past Medical History:  Diagnosis Date  . Anxiety   . CKD (chronic kidney disease) stage 3, GFR 30-59 ml/min 11/01/2019  . Hypertension   . Iron (Fe) deficiency anemia     Past Surgical History:  Procedure Laterality Date  . CESAREAN SECTION      Family History  Problem Relation Age of Onset  . CVA Mother   . Hypertension Mother   . Hypertension Sister   . CVA Sister   . Hypertension Brother   . Hypertension Maternal Grandmother   . Kidney failure Neg Hx     Allergies  Allergen Reactions  . Amlodipine Other (See Comments)    "Dental issues"   . Aspirin Hives  . Clonidine Derivatives Other (See Comments)    Unknown reaction  . Darvon [Propoxyphene Hcl] Other (See Comments)    Dizziness  . Lisinopril Other (See Comments)    Gout   . Toprol Xl [Metoprolol] Other (See Comments)    Nightmares   . Valium [Diazepam] Other (See Comments)    Dizziness    Outpatient Medications Prior to Visit  Medication Sig Dispense Refill  . Ensure (ENSURE) Take 237 mLs by mouth daily as needed (for supplementation).    . hydrochlorothiazide (HYDRODIURIL) 25 MG tablet Take 1 tablet (25 mg total) by mouth daily. 90 tablet 3  . irbesartan (AVAPRO) 150 MG tablet Take 1 tablet (150 mg total) by mouth daily. 30 tablet 6  . labetalol (NORMODYNE) 200 MG tablet Take 1 tablet (200 mg total) by mouth 2 (two) times daily. 180 tablet 1   No facility-administered medications prior to visit.     ROS Review of Systems  Constitutional: Negative for activity change, appetite change and fatigue.  HENT: Negative for congestion, sinus pressure and sore throat.    Eyes: Negative for visual disturbance.  Respiratory: Negative for cough, chest tightness, shortness of breath and wheezing.   Cardiovascular: Negative for chest pain and palpitations.  Gastrointestinal: Negative for abdominal distention, abdominal pain and constipation.  Endocrine: Negative for polydipsia.  Genitourinary: Negative for dysuria and frequency.  Musculoskeletal: Negative for arthralgias and back pain.  Skin: Negative for rash.  Neurological: Negative for tremors, light-headedness and numbness.  Hematological: Does not bruise/bleed easily.  Psychiatric/Behavioral: Negative for agitation and behavioral problems.    Objective:  BP (!) 147/67   Pulse 71   Ht 5\' 4"  (1.626 m)   Wt 182 lb 9.6 oz (82.8 kg)   SpO2 99%   BMI 31.34 kg/m   BP/Weight 12/11/2019 12/10/2019 11/11/2019  Systolic BP 147 152 139  Diastolic BP 67 78 71  Wt. (Lbs) 182.6 185 185.6  BMI 31.34 31.76 31.86      Physical Exam Constitutional:      General: She is not in acute distress.    Appearance: She is well-developed. She is not diaphoretic.  HENT:     Head: Normocephalic.     Right Ear: External ear normal.     Left Ear: External ear normal.     Nose: Nose normal.  Eyes:     Conjunctiva/sclera: Conjunctivae normal.     Pupils: Pupils are  equal, round, and reactive to light.  Neck:     Vascular: No JVD.  Cardiovascular:     Rate and Rhythm: Normal rate and regular rhythm.     Heart sounds: Normal heart sounds. No murmur heard.  No gallop.   Pulmonary:     Effort: Pulmonary effort is normal. No respiratory distress.     Breath sounds: Normal breath sounds. No wheezing or rales.  Chest:     Chest wall: No tenderness.     Breasts:        Right: No swelling, mass or skin change.        Left: No swelling, mass or skin change.  Abdominal:     General: Bowel sounds are normal. There is no distension.     Palpations: Abdomen is soft. There is no mass.     Tenderness: There is no abdominal  tenderness.  Genitourinary:    Comments: External genitalia, vagina, cervix, adnexa - normal Musculoskeletal:        General: No tenderness. Normal range of motion.     Cervical back: Normal range of motion.  Skin:    General: Skin is warm and dry.  Neurological:     Mental Status: She is alert and oriented to person, place, and time.     Deep Tendon Reflexes: Reflexes are normal and symmetric.     CMP Latest Ref Rng & Units 12/10/2019 11/07/2019 10/29/2019  Glucose 65 - 99 mg/dL - 85 88  BUN 8 - 27 mg/dL - 19 14  Creatinine 0.35 - 1.00 mg/dL - 4.65(K) 8.12(X)  Sodium 134 - 144 mmol/L - 143 141  Potassium 3.5 - 5.2 mmol/L - 4.2 4.5  Chloride 96 - 106 mmol/L - 102 105  CO2 20 - 29 mmol/L - 23 22  Calcium 8.7 - 10.3 mg/dL - 10.4(H) 9.9  Total Protein 6.0 - 8.5 g/dL 7.3 - -  Total Bilirubin 0.0 - 1.2 mg/dL 0.4 - -  Alkaline Phos 48 - 121 IU/L 120 - -  AST 0 - 40 IU/L 16 - -  ALT 0 - 32 IU/L 10 - -    Lipid Panel     Component Value Date/Time   CHOL 158 12/10/2019 0905   TRIG 99 12/10/2019 0905   HDL 44 12/10/2019 0905   CHOLHDL 3.6 12/10/2019 0905   LDLCALC 96 12/10/2019 0905    CBC    Component Value Date/Time   WBC 6.0 10/08/2019 2012   RBC 4.42 10/08/2019 2012   HGB 13.0 10/08/2019 2012   HCT 39.3 10/08/2019 2012   PLT 299 10/08/2019 2012   MCV 88.9 10/08/2019 2012   MCH 29.4 10/08/2019 2012   MCHC 33.1 10/08/2019 2012   RDW 12.1 10/08/2019 2012   LYMPHSABS 1.7 10/06/2019 1009   MONOABS 0.5 10/06/2019 1009   EOSABS 0.1 10/06/2019 1009   BASOSABS 0.0 10/06/2019 1009    Lab Results  Component Value Date   HGBA1C 4.9 10/06/2019    Assessment & Plan:  1. Annual physical exam Counseled on 150 minutes of exercise per week, healthy eating (including decreased daily intake of saturated fats, cholesterol, added sugars, sodium), routine healthcare maintenance.   2. Screening for cervical cancer - Cytology - PAP([facility])  3. Encounter for screening  mammogram for malignant neoplasm of breast - MM 3D SCREEN BREAST BILATERAL; Future   Health Care Maintenance: She will be calling her gastroenterologist to schedule her colonoscopy.  No orders of the defined types were placed in  this encounter.   Follow-up: Return in about 6 months (around 06/12/2020) for Chronic disease management.       Hoy Register, MD, FAAFP. [facility] [place], [place] [phone]   12/11/2019, 11:42 AM

## 2019-12-12 ENCOUNTER — Telehealth: Payer: Self-pay

## 2019-12-12 LAB — CYTOLOGY - PAP
Comment: NEGATIVE
Diagnosis: NEGATIVE
High risk HPV: NEGATIVE

## 2019-12-12 NOTE — Telephone Encounter (Signed)
Patient name and DOB has been verified Patient was informed of lab results. Patient had no questions.  

## 2019-12-12 NOTE — Telephone Encounter (Signed)
-----   Message from Hoy Register, MD sent at 12/12/2019  4:19 PM EDT ----- PAP smear is negative for malignancy

## 2019-12-24 ENCOUNTER — Other Ambulatory Visit: Payer: Self-pay | Admitting: Cardiovascular Disease

## 2019-12-24 MED ORDER — IRBESARTAN 150 MG PO TABS: 150.0000 mg | ORAL_TABLET | Freq: Every day | ORAL | 6 refills | Status: DC

## 2019-12-24 NOTE — Progress Notes (Signed)
Contacted patient and informed her that her irbesartan will be sent to the pharmacy.

## 2020-01-08 NOTE — Progress Notes (Signed)
01/09/2020 April [name] 07-01-1955 [id number]   HPI:  April [name] is a 64 y.o. female patient of Dr [name], who presents today for advanced hypertension clinic follow up.  In addition to hypertension, her medical history is significant for chronic diastolic HF and obesity.  She was seen by Dr. [name] in early July and found to have a blood pressure of 144/64, much improved from a prior visit with Dr. [name] in June, when she was at 182/78.  Patient notes that she has been hypertensive for about 10 years and has often struggled with control.  At her last visit pressure was still elevated and we increased irbesartan to 150 mg daily.  Unfortunately she was having some dizziness associated with the hctz, so we cut that dose back to 12.5 mg.    Today she returns for follow up.  Her last 2 weeks of readings in  Vivify are summarized below.  She has been feeling better, dizziness is now much less common and she states she is fine to stay on the hctz.  Her home life continues to have stresses as she deals with both her elderly mother and mother-in-law, as well as her husband and his health issues, and a disabled sister.  She finds herself being pulled in multiple directions.  Her BP readings for the last two days have dropped into a normal range, she is not sure what she has/hasn't done for it to all of a sudden be in range.    At her last visit we drew cholesterol labs, as she was concerned about some (1-59%) renal artery stenosis.  LDL came back at 96 and she notes that she did have a prior stroke.    Click here for Secondary HTN Screening Tool (Then Refresh Note) Secondary Cause Data Comments  Herbals/Drugs  Screened:  10/31/2019 Not taking         Renovascular HTN  Screened:  10/07/2019 No History Previously ruled out  RAS 1-59% in right renal artery No concerns with Left   Sleep Apnea  Screened:  10/31/2019 No History    Testing not indicated  Hyperthyroidism  Screened:   09/29/2019 No History Previously ruled out  TSH 1.006  Hypothyroidism   Screened:  09/29/2019 No History Previously ruled out   TSH 1.006  Hyperaldosteronism  Screened:  10/06/2019 No History Previously ruled out   Labs WNL  Pheochromocytoma  Screened:  10/06/2019 No History Previously ruled out  Metanephrines WNL  Cushing's Syndrome  Screened:  10/06/2019 No History Previously ruled out   Cortisol 7.9 (WNL)  Hyperparathyroidism             Coarctation of Aorta  Screened:  10/31/2019 No History Previously ruled out  BP symmetrical  Chemotherapeutic Agents        Compliance                Blood Pressure Goal:  130/80  Current Medications: irbesartan 150 mg, hctz 12.5mg , labetalol 200 mg bid   Family Hx: mother paralyzed from stroke at 42, now 40; sister CABG at 64, cognitively challenged from stroke at 89, now 73: both with hypertension; brother CABG at 50, died at 52; 2 other siblings healthy; father passed at 81 from "natural causes"; 2 sons 70 and 68, neither with hypertension  Social Hx: no tobacco, no alcohol; now caffeine free cola, about 3 cans per week;   Diet:  Drinks mostly sparkling flavored water, some hot tea; Currently doing an intermittent  fast (about 16 hrs/day) and keeps a food journal on a daily basis.  Plans her meals ahead of time and rarely eats out.    Exercise:  Walking, aerobic and strength training 4-5 days a week   Home BP readings: from Vivify app, last 14 days average 149/73  HR  Range 119-184/63-100, interestingly, the last 2 days were at 128/70 and 119/58.    Previous visit 14 day average 137/72, HR 84.  Range 126-151/60-89  Previously tried: metoprolol, labetalol, clonidine, lisinopril/hctz, amlodipine  Labs: 10/2018: Na 143, K 4.2, Glu 85, BUN 19, SCr 1.11 GFR 61  Wt Readings from Last 3 Encounters:  01/09/20 183 lb (83 kg)  12/11/19 182 lb 9.6 oz (82.8 kg)  12/10/19 185 lb (83.9 kg)   BP Readings from Last 3 Encounters:  01/09/20  132/74  12/11/19 (!) 147/67  12/10/19 (!) 152/78   Pulse Readings from Last 3 Encounters:  01/09/20 95  12/11/19 71  12/10/19 91    Current Outpatient Medications  Medication Sig Dispense Refill  . Ensure (ENSURE) Take 237 mLs by mouth daily as needed (for supplementation).    . hydrochlorothiazide (HYDRODIURIL) 25 MG tablet Take 1 tablet (25 mg total) by mouth daily. 90 tablet 3  . irbesartan (AVAPRO) 150 MG tablet Take 1 tablet (150 mg total) by mouth daily. 30 tablet 6  . labetalol (NORMODYNE) 200 MG tablet Take 1 tablet (200 mg total) by mouth 2 (two) times daily. 180 tablet 1  . rosuvastatin (CRESTOR) 10 MG tablet Take 1 tablet (10 mg total) by mouth daily. 90 tablet 3   No current facility-administered medications for this visit.    Allergies  Allergen Reactions  . Amlodipine Other (See Comments)    "Dental issues"   . Aspirin Hives  . Clonidine Derivatives Other (See Comments)    Unknown reaction  . Darvon [Propoxyphene Hcl] Other (See Comments)    Dizziness  . Lisinopril Other (See Comments)    Gout   . Toprol Xl [Metoprolol] Other (See Comments)    Nightmares   . Valium [Diazepam] Other (See Comments)    Dizziness    Past Medical History:  Diagnosis Date  . Anxiety   . CKD (chronic kidney disease) stage 3, GFR 30-59 ml/min 11/01/2019  . Hypertension   . Iron (Fe) deficiency anemia     Blood pressure 132/74, pulse 95, resp. rate 16, height 5\' 4"  (1.626 m), weight 183 lb (83 kg), SpO2 91 %.  Hyperlipidemia Patient notes she has had a prior stroke, which makes LDL goal at < 70.  Also had RAS at 1-59% at last check.  Will start her on rosuvastatin 10 mg daily and repeat lipid labs in 3 months.   Hypertensive urgency Patient with isolated systolic hypertension, doing better in the last few days.  She was encouraged to see if there is any correlation with her meals (and sodium intake) and blood pressure.  She has previously kept 2 separate journals, but I asked  that she write her BP readings in her food journal to look for any causation.  She will continue with her current meds for another month, but understands that if her pressure does not stay in the normal ranges, we will have to increase the irbesartan or consider addition of further medications.  We will see her for ADV HTN 3 in one month   PharmD CPP [facility] [address] [place], [place] [phone]

## 2020-01-09 ENCOUNTER — Ambulatory Visit (INDEPENDENT_AMBULATORY_CARE_PROVIDER_SITE_OTHER): Payer: BC Managed Care – PPO | Admitting: Pharmacist Clinician (PhC)/ Clinical Pharmacy Specialist

## 2020-01-09 ENCOUNTER — Other Ambulatory Visit: Payer: Self-pay

## 2020-01-09 DIAGNOSIS — E785 Hyperlipidemia, unspecified: Secondary | ICD-10-CM

## 2020-01-09 DIAGNOSIS — I160 Hypertensive urgency: Secondary | ICD-10-CM | POA: Diagnosis not present

## 2020-01-09 MED ORDER — ROSUVASTATIN CALCIUM 10 MG PO TABS
10.0000 mg | ORAL_TABLET | Freq: Every day | ORAL | 3 refills | Status: DC
Start: 2020-01-09 — End: 2020-05-11

## 2020-01-09 NOTE — Patient Instructions (Addendum)
Return for a a follow up appointment October 7 at 10:00 am  If you have any questions or concerns, call Rosalea Withrow/Raquel at [phone]  Check your blood pressure at home daily and keep record of the readings.  Take your BP meds as follows:  Start rosuvastatin 10 mg once daily  Continue with all other medications  Bring all of your meds, your BP cuff and your record of home blood pressures to your next appointment.  Exercise as you're able, try to walk approximately 30 minutes per day.  Keep salt intake to a minimum, especially watch canned and prepared boxed foods.  Eat more fresh fruits and vegetables and fewer canned items.  Avoid eating in fast food restaurants.    HOW TO TAKE YOUR BLOOD PRESSURE: . Rest 5 minutes before taking your blood pressure. .  Don't smoke or drink caffeinated beverages for at least 30 minutes before. . Take your blood pressure before (not after) you eat. . Sit comfortably with your back supported and both feet on the floor (don't cross your legs). . Elevate your arm to heart level on a table or a desk. . Use the proper sized cuff. It should fit smoothly and snugly around your bare upper arm. There should be enough room to slip a fingertip under the cuff. The bottom edge of the cuff should be 1 inch above the crease of the elbow. . Ideally, take 3 measurements at one sitting and record the average.

## 2020-01-09 NOTE — Assessment & Plan Note (Signed)
Patient with isolated systolic hypertension, doing better in the last few days.  She was encouraged to see if there is any correlation with her meals (and sodium intake) and blood pressure.  She has previously kept 2 separate journals, but I asked that she write her BP readings in her food journal to look for any causation.  She will continue with her current meds for another month, but understands that if her pressure does not stay in the normal ranges, we will have to increase the irbesartan or consider addition of further medications.  We will see her for ADV HTN 3 in one month

## 2020-01-09 NOTE — Assessment & Plan Note (Signed)
Patient notes she has had a prior stroke, which makes LDL goal at < 70.  Also had RAS at 1-59% at last check.  Will start her on rosuvastatin 10 mg daily and repeat lipid labs in 3 months.

## 2020-01-21 NOTE — Addendum Note (Signed)
Addended by: Berenice Bouton on: 01/21/2020 03:48 PM   Modules accepted: Orders

## 2020-01-30 ENCOUNTER — Telehealth: Payer: Self-pay

## 2020-01-30 ENCOUNTER — Ambulatory Visit: Payer: BC Managed Care – PPO

## 2020-01-30 DIAGNOSIS — Z0000 Encounter for general adult medical examination without abnormal findings: Secondary | ICD-10-CM

## 2020-01-30 NOTE — Telephone Encounter (Signed)
Called patient to provide health coaching regarding sleep issues. Left patient a message for them to call me back at [phone]. Will call patient back around 2pm on 01/31/20.

## 2020-01-31 ENCOUNTER — Ambulatory Visit: Payer: BC Managed Care – PPO

## 2020-01-31 ENCOUNTER — Other Ambulatory Visit: Payer: Self-pay

## 2020-01-31 ENCOUNTER — Telehealth: Payer: Self-pay

## 2020-01-31 DIAGNOSIS — Z0000 Encounter for general adult medical examination without abnormal findings: Secondary | ICD-10-CM

## 2020-01-31 NOTE — Telephone Encounter (Signed)
Appointment Outcome: Patient's appointment started promptly at 2pm via telephone. The appointment ended at 2:30pm.  Completed, session #: initial health coaching session  AGREEMENTS SECTION   Overall Goal(s): Improve sleep hygiene                                              Agreement/Action Steps:  Begin to carve out 10-15 minutes to break away from everyday responsibilities to decompress by either walking or doing something she enjoys of her choice.  Progress Notes:  Patient states that they get on average less than 4 hours of sleep per night. Patient expressed that they have tried yoga, meditation, visualization, music therapy, and having a support system as techniques to manage stress.  Patient was asked if she consumed a lot of caffeine of if she smokes. Patient stated that she drinks caffeine free and decaffeinated soda and teas. Patient denies ever smoking.  Patient was asked if she had thought about a gratitude journal that would help her write about positive things to give her mind a chance to refocus. Patient stated they attempted that strategies and it did not work for her.   Patient stated that she tries to go to bed and wake up around the same time every day. Patient does not need an alarm clock. She is a night owl and early worm, so she operates off minimal sleep until she is burnt out.   Patient was asked if she was evaluated for insomnia or sleep apnea. Patient stated that her provider mentioned insomnia and suggested that she take a low dose of melatonin before bed. Patient is not interested in adding any additional pill to her medication regimen.  When asked if she have implemented boundaries, the patient stated that she tried to enforce them before but that didn't work out and it has caused some family tension.   Indicators of Success and Accountability:  patient is able to fall and stay asleep naturally.  Readiness: Patient is in the preparation stage of behavior  change.   Strengths and Supports: Patient has put forth the effort to find creative ways to address her inability to fall and stay asleep. Patient is advocating for her health needs with her provider.  Challenges and Barriers: Patient is responsible for a lot of individuals and needs a reliable support system.  Coaching Outcomes: Care Guide looked into her medications to determine if any of her medications could possibly be a stimulant. No such information was found. Patient stated that she will work on setting time out for herself even if it is to go and get an ice cream cone or walking for 10-15 minutes to clear her head. Patient will follow up with patient to check in with her progress.

## 2020-02-06 ENCOUNTER — Ambulatory Visit (INDEPENDENT_AMBULATORY_CARE_PROVIDER_SITE_OTHER): Payer: BC Managed Care – PPO | Admitting: Pharmacist

## 2020-02-06 ENCOUNTER — Other Ambulatory Visit: Payer: Self-pay

## 2020-02-06 VITALS — BP 144/68 | HR 98 | Resp 16 | Ht 64.0 in | Wt 184.8 lb

## 2020-02-06 DIAGNOSIS — E785 Hyperlipidemia, unspecified: Secondary | ICD-10-CM | POA: Diagnosis not present

## 2020-02-06 DIAGNOSIS — I10 Essential (primary) hypertension: Secondary | ICD-10-CM | POA: Insufficient documentation

## 2020-02-06 MED ORDER — CHLORTHALIDONE 25 MG PO TABS
25.0000 mg | ORAL_TABLET | Freq: Every day | ORAL | 3 refills | Status: DC
Start: 2020-02-06 — End: 2022-09-12

## 2020-02-06 MED ORDER — IRBESARTAN 150 MG PO TABS: 150.0000 mg | ORAL_TABLET | Freq: Two times a day (BID) | ORAL | 5 refills | Status: DC

## 2020-02-06 NOTE — Progress Notes (Signed)
HPI:  April [name] is a 64 y.o. female patient of Dr [name], who presents today for advanced hypertension clinic follow up.  In addition to hypertension, her medical history is significant for chronic diastolic HF and obesity.  Patient notes that she has been hypertensive for about 10 years and has often struggled with control.    Today she returns for follow up.  Her last 2 weeks of readings in Vivify are summarized below.  She has been feeling better, dizziness is now much less common and she states she is fine to stay on the hctz.  Her stress level continues to be high , but is working with Renaee Munda to develop better coping with stressors. She reports few episodes of chest pain that resolved in less than a minuets without intervention. She increased her irbesartan dose to 150mg  twice daily and modified her diet to decrease sodium, and decrease fatty foods.   Click here for Secondary HTN Screening Tool (Then Refresh Note) Secondary Cause Data Comments  Herbals/Drugs  Screened:  10/31/2019 Not taking         Renovascular HTN  Screened:  10/07/2019 No History Previously ruled out  RAS 1-59% in right renal artery No concerns with Left   Sleep Apnea  Screened:  10/31/2019 No History    Testing not indicated  Hyperthyroidism  Screened:  09/29/2019 No History Previously ruled out  TSH 1.006  Hypothyroidism   Screened:  09/29/2019 No History Previously ruled out   TSH 1.006  Hyperaldosteronism  Screened:  10/06/2019 No History Previously ruled out   Labs WNL  Pheochromocytoma  Screened:  10/06/2019 No History Previously ruled out  Metanephrines WNL  Cushing's Syndrome  Screened:  10/06/2019 No History Previously ruled out   Cortisol 7.9 (WNL)  Hyperparathyroidism             Coarctation of Aorta  Screened:  10/31/2019 No History Previously ruled out  BP symmetrical  Chemotherapeutic Agents        Compliance              Blood Pressure Goal:  130/80  Current Medications:   irbesartan 150mg  - twice daily for > 2 weeks  hctz 12.5mg - daily in AM  labetalol 200 mg - twice daily  Family Hx: mother paralyzed from stroke at 84, now 34; sister CABG at 56, cognitively challenged from stroke at 41, now 3: both with hypertension; brother CABG at 11, died at 17; 2 other siblings healthy; father passed at 39 from "natural causes"; 2 sons 95 and 40, neither with hypertension  Social Hx: no tobacco, no alcohol; now caffeine free cola, about 3 cans per week;   Diet:  Drinks mostly sparkling flavored water, some hot tea; Currently doing an intermittent fast (about 16 hrs/day) and keeps a food journal on a daily basis.  Plans her meals ahead of time and rarely eats out.    Exercise:  Walking, aerobic and strength training 4-5 days a week   Home BP readings: from Vivify app, 10 readings; average 148/80 (range BP 120-170/70-89); HR 78-95bpm  Previously tried: metoprolol, labetalol, clonidine, lisinopril/hctz, amlodipine  Labs: 10/2018: Na 143, K 4.2, Glu 85, BUN 19, SCr 1.11 GFR 61  Wt Readings from Last 3 Encounters:  02/06/20 184 lb 12.8 oz (83.8 kg)  01/09/20 183 lb (83 kg)  12/11/19 182 lb 9.6 oz (82.8 kg)   BP Readings from Last 3 Encounters:  02/06/20 (!) 144/68  01/09/20 132/74  12/11/19 03/10/20)  147/67   Pulse Readings from Last 3 Encounters:  02/06/20 98  01/09/20 95  12/11/19 71    Current Outpatient Medications  Medication Sig Dispense Refill  . Ensure (ENSURE) Take 237 mLs by mouth daily as needed (for supplementation).    . irbesartan (AVAPRO) 150 MG tablet Take 1 tablet (150 mg total) by mouth 2 (two) times daily. 60 tablet 5  . labetalol (NORMODYNE) 200 MG tablet Take 1 tablet (200 mg total) by mouth 2 (two) times daily. 180 tablet 1  . rosuvastatin (CRESTOR) 10 MG tablet Take 1 tablet (10 mg total) by mouth daily. 90 tablet 3  . chlorthalidone (HYGROTON) 25 MG tablet Take 1 tablet (25 mg total) by mouth daily. TO REPLACE HYDROCHLOROTHIAZIDE 30 tablet 3     No current facility-administered medications for this visit.    Allergies  Allergen Reactions  . Amlodipine Other (See Comments)    "Dental issues"   . Aspirin Hives  . Clonidine Derivatives Other (See Comments)    Unknown reaction  . Darvon [Propoxyphene Hcl] Other (See Comments)    Dizziness  . Lisinopril Other (See Comments)    Gout   . Toprol Xl [Metoprolol] Other (See Comments)    Nightmares   . Valium [Diazepam] Other (See Comments)    Dizziness    Past Medical History:  Diagnosis Date  . Anxiety   . CKD (chronic kidney disease) stage 3, GFR 30-59 ml/min (HCC) 11/01/2019  . Hypertension   . Iron (Fe) deficiency anemia     Blood pressure (!) 144/68, pulse 98, resp. rate 16, height 5\' 4"  (1.626 m), weight 184 lb 12.8 oz (83.8 kg), SpO2 93 %.  Hypertension BP remains above goal ,but significantly improved since last visit. Patient is making significant lifestyle modifications including healthier diet, and stress management. She already increase irbesartan on her own to 150mg  BID and is taking HCTZ 25mg  every evening.  Will stop HCTZ, strart chlorthalidone, and repeat BMET in 2 weeks. Patient is already scheduled for f/u with DR in 4 weeks. I instructed her to bring Vivify cuff to OV with Dr .   Tesla Keeler Rodriguez-Guzman PharmD, BCPS, CPP [facility] [address] [place] [facility] 02/06/2020 12:02 PM

## 2020-02-06 NOTE — Assessment & Plan Note (Signed)
BP remains above goal ,but significantly improved since last visit. Patient is making significant lifestyle modifications including healthier diet, and stress management. She already increase irbesartan on her own to 150mg  BID and is taking HCTZ 25mg  every evening.  Will stop HCTZ, strart chlorthalidone, and repeat BMET in 2 weeks. Patient is already scheduled for f/u with DR in 4 weeks. I instructed her to bring Vivify cuff to OV with Dr .

## 2020-02-06 NOTE — Patient Instructions (Addendum)
Return for a follow up appointment in 4 weeks with Dr [name]  Go to the lab in 2 weeks  Check your blood pressure at home daily (if able) and keep record of the readings.  Take your BP meds as follows: *STOP TAKING hydrochlorothiazide* *START taking chlorthalidone 25mg  daily*  *Okay to start taking melatonin 3mg  - 5mg  every evening*  Bring all of your lits, your Vivify BP cuff and your record of home blood pressures to your next appointment.  Exercise as you're able, try to walk approximately 30 minutes per day.  Keep salt intake to a minimum, especially watch canned and prepared boxed foods.  Eat more fresh fruits and vegetables and fewer canned items.  Avoid eating in fast food restaurants.    HOW TO TAKE YOUR BLOOD PRESSURE: . Rest 5 minutes before taking your blood pressure. .  Don't smoke or drink caffeinated beverages for at least 30 minutes before. . Take your blood pressure before (not after) you eat. . Sit comfortably with your back supported and both feet on the floor (don't cross your legs). . Elevate your arm to heart level on a table or a desk. . Use the proper sized cuff. It should fit smoothly and snugly around your bare upper arm. There should be enough room to slip a fingertip under the cuff. The bottom edge of the cuff should be 1 inch above the crease of the elbow. . Ideally, take 3 measurements at one sitting and record the average.

## 2020-02-24 DIAGNOSIS — I10 Essential (primary) hypertension: Secondary | ICD-10-CM | POA: Diagnosis not present

## 2020-02-24 DIAGNOSIS — E785 Hyperlipidemia, unspecified: Secondary | ICD-10-CM | POA: Diagnosis not present

## 2020-02-25 LAB — BASIC METABOLIC PANEL
BUN/Creatinine Ratio: 15 (ref 12–28)
BUN: 19 mg/dL (ref 8–27)
CO2: 25 mmol/L (ref 20–29)
Calcium: 9.9 mg/dL (ref 8.7–10.3)
Chloride: 105 mmol/L (ref 96–106)
Creatinine, Ser: 1.31 mg/dL — ABNORMAL HIGH (ref 0.57–1.00)
GFR calc Af Amer: 49 mL/min/{1.73_m2} — ABNORMAL LOW (ref >59)
GFR calc non Af Amer: 43 mL/min/{1.73_m2} — ABNORMAL LOW (ref >59)
Glucose: 66 mg/dL (ref 65–99)
Potassium: 4.3 mmol/L (ref 3.5–5.2)
Sodium: 144 mmol/L (ref 134–144)

## 2020-03-09 ENCOUNTER — Encounter: Payer: Self-pay | Admitting: Cardiovascular Disease

## 2020-03-09 ENCOUNTER — Ambulatory Visit (INDEPENDENT_AMBULATORY_CARE_PROVIDER_SITE_OTHER): Payer: BC Managed Care – PPO | Admitting: Cardiovascular Disease

## 2020-03-09 ENCOUNTER — Other Ambulatory Visit: Payer: Self-pay

## 2020-03-09 VITALS — BP 140/62 | HR 93 | Ht 64.0 in | Wt 186.0 lb

## 2020-03-09 DIAGNOSIS — I160 Hypertensive urgency: Secondary | ICD-10-CM | POA: Diagnosis not present

## 2020-03-09 DIAGNOSIS — I5032 Chronic diastolic (congestive) heart failure: Secondary | ICD-10-CM | POA: Diagnosis not present

## 2020-03-09 DIAGNOSIS — R0789 Other chest pain: Secondary | ICD-10-CM

## 2020-03-09 DIAGNOSIS — I10 Essential (primary) hypertension: Secondary | ICD-10-CM

## 2020-03-09 MED ORDER — LABETALOL HCL 200 MG PO TABS: 200.0000 mg | ORAL_TABLET | Freq: Two times a day (BID) | ORAL | 3 refills | Status: DC

## 2020-03-09 NOTE — Patient Instructions (Addendum)
Medication Instructions:  Your physician recommends that you continue on your current medications as directed. Please refer to the Current Medication list given to you today.  Labwork: NONE   Testing/Procedures: NONE    Follow-Up: 05/11/2020 AT 8:00 AM      You will receive a phone call from the [facility] exercise [facility] nutrition program to schedule an initial assessment.

## 2020-03-09 NOTE — Progress Notes (Signed)
Hypertension Clinic Initial Assessment:    Date:  03/28/2020   ID:  April [name], DOB [birth year], MRN [id number]  PCP:  Hoy Register, MD  Cardiologist:  No primary care provider on file.  Nephrologist:  Referring MD: Hoy Register, MD   CC: Hypertension  History of Present Illness:    April [name] is a 64 y.o. female with a hx of hypertension, chronic diastolic heart failure and obesity here to establish care in the hypertension clinic. She was first diagnosed approximately 10 years ago.  She is always struggled to control it.  She has had multiple hospitalizations for hypertensive urgency.  The most recent was 10/07/2019.  Hydralazine was increased and she was started on labetalol instead of metoprolol.  She had renal artery Dopplers that showed 1 to 59% stenosis on the right and normal on the left.  A.m. cortisol was within normal limits.  Renal and aldosterone levels were not consistent with hyperaldosteronism.  She also had a hospitalization 08/2019.  During that hospitalization her home hydrochlorothiazide and lisinopril were held due to a slight increase in her creatinine.  She saw Dr. Milas Gain on 6/24 and was started on irbesartan.  Since then her BP has still been high at home but labile.  She thinks that her blood pressure is related to stress.  It has been as high as the 170s over 60s.  She took her irbesartan and then rechecked it several hours later and there was no change.  Overall she follows a DASH diet and limits her sodium intake to 1500 mg daily.  She does not think she is particularly salt sensitive.  She has no caffeine or alcohol in her diet.  She snores but does not have apnea.  She does not feel rested in the morning, but she thinks this is because she does not sleep for many hours.  She has no daytime somnolence.  She has not been exercising lately because her blood pressure has not been controlled and she is afraid it will go very high.  She notes that overall her  blood pressure tends to be higher in the mornings and worse later in the day.  She struggles with palpitations and thinks this may also be stress related.  Her husband has coronary artery disease and has had prior heart attacks.  Her mother-in-law is in a long-term care facility and finding it.  Her mother and sister both had strokes with residual deficits.  She helps to try and take care of all of them.  At her first appointment hydralazine was stopped and she was started on irbestartan and HCTZ.  This was then switched to chlorthalidone and irbestartan was increased.  Her BP has ranged 120-140 and her heart rate has been 60-70s.  She saw Raquel on 10/7 and her BP was still elevated so HCTZ was switched to chlorthalidone.  They also noted that her BP runs higher with a small cuff  She has struggled with gout in her R knee and hasn't been exercising as much.  She continues to have mild, sporadic chest pain that lasts for 1-2 minutes.  It occurs at rest.  It never occurs with exertion.  Previous antihypertensives: Metoprolol Labetalol Clonidine Lisinopril/hctz amlodipine  Past Medical History:  Diagnosis Date  . Anxiety   . Atypical chest pain 03/28/2020  . CKD (chronic kidney disease) stage 3, GFR 30-59 ml/min (HCC) 11/01/2019  . Hypertension   . Iron (Fe) deficiency anemia     Past Surgical  History:  Procedure Laterality Date  . CESAREAN SECTION      Current Medications: Current Meds  Medication Sig  . chlorthalidone (HYGROTON) 25 MG tablet Take 1 tablet (25 mg total) by mouth daily. TO REPLACE HYDROCHLOROTHIAZIDE  . irbesartan (AVAPRO) 150 MG tablet Take 1 tablet (150 mg total) by mouth 2 (two) times daily.  Marland Kitchen labetalol (NORMODYNE) 200 MG tablet Take 1 tablet (200 mg total) by mouth 2 (two) times daily.  . rosuvastatin (CRESTOR) 10 MG tablet Take 1 tablet (10 mg total) by mouth daily. (Patient not taking: Reported on 03/25/2020)  . [DISCONTINUED] Ensure (ENSURE) Take 237 mLs by mouth  daily as needed (for supplementation).  . [DISCONTINUED] labetalol (NORMODYNE) 200 MG tablet Take 1 tablet (200 mg total) by mouth 2 (two) times daily.     Allergies:   Amlodipine, Aspirin, Clonidine derivatives, Darvon [propoxyphene hcl], Lisinopril, Toprol xl [metoprolol], and Valium [diazepam]   Social History   Socioeconomic History  . Marital status: Married    Spouse name: Not on file  . Number of children: Not on file  . Years of education: Not on file  . Highest education level: Not on file  Occupational History  . Occupation: retired  Tobacco Use  . Smoking status: Never Smoker  . Smokeless tobacco: Never Used  Substance and Sexual Activity  . Alcohol use: Not Currently    Comment: rare  . Drug use: Never  . Sexual activity: Not on file  Other Topics Concern  . Not on file  Social History Narrative  . Not on file   Social Determinants of Health   Financial Resource Strain:   . Difficulty of Paying Living Expenses: Not on file  Food Insecurity:   . Worried About Programme researcher, broadcasting/film/video in the Last Year: Not on file  . Ran Out of Food in the Last Year: Not on file  Transportation Needs:   . Lack of Transportation (Medical): Not on file  . Lack of Transportation (Non-Medical): Not on file  Physical Activity:   . Days of Exercise per Week: Not on file  . Minutes of Exercise per Session: Not on file  Stress: Stress Concern Present  . Feeling of Stress : Very much  Social Connections:   . Frequency of Communication with Friends and Family: Not on file  . Frequency of Social Gatherings with Friends and Family: Not on file  . Attends Religious Services: Not on file  . Active Member of Clubs or Organizations: Not on file  . Attends Banker Meetings: Not on file  . Marital Status: Not on file     Family History: The patient's family history includes CVA in her mother and sister; Hypertension in her brother, maternal grandmother, mother, and sister. There  is no history of Kidney failure.  ROS:   Please see the history of present illness.     All other systems reviewed and are negative.  EKGs/Labs/Other Studies Reviewed:    EKG:  EKG is not ordered today.  The ekg ordered 10/08/19 demonstrates sinus tachycardia.  Rate 122 bpm.    Recent Labs: 09/29/2019: Magnesium 1.9; TSH 1.006 10/06/2019: B Natriuretic Peptide 37.7 10/08/2019: Hemoglobin 13.0; Platelets 299 12/10/2019: ALT 10 02/24/2020: BUN 19; Creatinine, Ser 1.31; Potassium 4.3; Sodium 144   Recent Lipid Panel    Component Value Date/Time   CHOL 158 12/10/2019 0905   TRIG 99 12/10/2019 0905   HDL 44 12/10/2019 0905   CHOLHDL 3.6 12/10/2019 0905  LDLCALC 96 12/10/2019 0905    Physical Exam:    VS:  BP 140/62   Pulse 93   Ht 5\' 4"  (1.626 m)   Wt 186 lb (84.4 kg)   SpO2 99%   BMI 31.93 kg/m  , BMI Body mass index is 31.93 kg/m. GENERAL:  Well appearing HEENT: Pupils equal round and reactive, fundi not visualized, oral mucosa unremarkable NECK:  No jugular venous distention, waveform within normal limits, carotid upstroke brisk and symmetric, no bruits LUNGS:  Clear to auscultation bilaterally HEART:  RRR.  PMI not displaced or sustained,S1 and S2 within normal limits, no S3, no S4, no clicks, no rubs, no murmurs ABD:  Flat, positive bowel sounds normal in frequency in pitch, no bruits, no rebound, no guarding, no midline pulsatile mass, no hepatomegaly, no splenomegaly EXT:  2 plus pulses throughout, no edema, no cyanosis no clubbing SKIN:  No rashes no nodules NEURO:  Cranial nerves II through XII grossly intact, motor grossly intact throughout PSYCH:  Cognitively intact, oriented to person place and time   ASSESSMENT:    1. Hypertension, unspecified type   2. Hypertensive urgency   3. Chronic diastolic CHF (congestive heart failure) (HCC)   4. Atypical chest pain     PLAN:    # Essential hypertension:  Her blood pressure  Has been much better controlled and has  mostly been controlled.  She is interested in enrolling in the PREP program through the [facility] and would rather wait than add any additional medications at this time.  Continue chlorthalidone, irbesartan, and labetalol.  Secondary Causes of Hypertension  Medications/Herbal: OCP, steroids, stimulants, antidepressants, weight loss medication, immune suppressants, NSAIDs, sympathomimetics, alcohol, caffeine, licorice, ginseng, St. John's wort, chemo (none apply) Sleep Apnea (testing not indicated) Renal artery stenosis (1-59% on R 10/2019.  L normal) Hyperaldosteronism (negative 10/2019) Hyper/hypothyroidism (TSH normal 08/2019) Pheochromocytoma: (negative 10/2019) Cushing's syndrome: cortisol 7.9 Coarctation of the aorta (symmetric BP)  # Atypical chest pain: # Hyperlipidemia: Symptoms are atypical.  No ischemic work up at this time.  Continue rosuvastatin.  Disposition:    FU with MD/PharmD in 2 months   Medication Adjustments/Labs and Tests Ordered: Current medicines are reviewed at length with the patient today.  Concerns regarding medicines are outlined above.  No orders of the defined types were placed in this encounter.  Meds ordered this encounter  Medications  . labetalol (NORMODYNE) 200 MG tablet    Sig: Take 1 tablet (200 mg total) by mouth 2 (two) times daily.    Dispense:  180 tablet    Refill:  3    NEW DOSE, D/C 100 MG RX AND PREVIOUS RX     Signed, 10/2019, MD  03/28/2020 3:35 PM    [facility]

## 2020-03-24 ENCOUNTER — Telehealth: Payer: Self-pay | Admitting: Cardiovascular Disease

## 2020-03-24 NOTE — Telephone Encounter (Signed)
Spoke with patient. Patient reports since her appointment on 03/09/20 the pain and tenderness in her joints has gotten worse. It is now pain, tenderness, joint swolling and her joints hurt with bending. She explains she and Dr. [name] discussed the possibility of changing medication but at that time but she thought it would get better with time. She know wants to know if she should seek care from a rheumatologist or pain specialist or if Dr. [name] has any other recommendations.   Will forward to MD for review.

## 2020-03-24 NOTE — Telephone Encounter (Signed)
Patient is following up regarding joint pain, discussed during appointment on 03/09/20. She assumes the joint pain is a side effect of taking Rosuvastatin. She states over the past few weeks the pain has become progressively worse and it is now intolerable. She states she has been using a cane for additional stability, as the pain causes difficulties with walking. She would like to know if she needs to see a rheumatologist or another specialist for pain management. Please return call to advise.

## 2020-03-24 NOTE — Telephone Encounter (Signed)
Spoke with patient and she is actually having joint pain only in the knee  She states it hurts bending, standing, and even throbbing sitting Per Dr [name] ok to hold Rosuvastatin x 2 weeks but does not sound like it related to Rosuvastatin, follow up with PCP  Advised patient, verbalized understanding.

## 2020-03-25 ENCOUNTER — Ambulatory Visit: Payer: Self-pay

## 2020-03-25 ENCOUNTER — Ambulatory Visit (INDEPENDENT_AMBULATORY_CARE_PROVIDER_SITE_OTHER): Payer: BC Managed Care – PPO | Admitting: Family Medicine

## 2020-03-25 ENCOUNTER — Other Ambulatory Visit: Payer: Self-pay

## 2020-03-25 DIAGNOSIS — M25561 Pain in right knee: Secondary | ICD-10-CM

## 2020-03-25 DIAGNOSIS — M25562 Pain in left knee: Secondary | ICD-10-CM

## 2020-03-25 MED ORDER — DICLOFENAC SODIUM 1 % EX GEL: 4.0000 g | Freq: Four times a day (QID) | CUTANEOUS | 6 refills | Status: DC | PRN

## 2020-03-25 NOTE — Progress Notes (Signed)
I saw and examined the patient with Dr. Marga Hoots and agree with assessment and plan as outlined.    Right greater than left knee pain for the past 2 weeks.  She wondered whether it could be from taking Crestor.  She has been on Crestor for about 2 months.  After discussion with her PCP, she stopped it today.  She has a family history of osteoarthritis in her mother who had bilateral knee replacements.  X-rays reveal tricompartmental changes in both knees, most pronounced in the medial compartment and patellofemoral joints.  She will start water aerobics, glucosamine, turmeric, and Boswellia.  Voltaren gel topically.  Return if symptoms persist.

## 2020-03-25 NOTE — Telephone Encounter (Signed)
Thank you :)

## 2020-03-25 NOTE — Progress Notes (Signed)
Office Visit Note   Patient: April [name]           Date of Birth: 1955/10/27           MRN: [id number] Visit Date: 03/25/2020 Requested by: Hoy Register, MD [id number] [place],  [place] [address] PCP: Hoy Register, MD  Subjective: Chief Complaint  Patient presents with  . Right Knee - Pain    Pain in bilateral knees x 2 weeks. NKI. Right worse than left. "Feels like the right knee is swollen." Hurts to flex the knee. No popping/grinding. Right knee does not feel stable when standing up after sitting. Using cane now.  . Left Knee - Pain    HPI: 64yo F presenting to clinic with 2 weeks of bilateral (R>>L) knee pain. Patient states that she associates this pain with starting Crestor therapy for her cholesterol, however her PCM recommended she come here to r/o other underlying causes. She says that her right knee pain is so bad that she has trouble walking, and just bending/extending her knee is very uncomfortable. She does not like taking medications. She says she has a strong family history of knee OA, including her mother and her aunt, both of whom required TKA. Denies locking sensations in the knee. States her R knee was swollen, but it feels better now. She says she stopped taking the Crestor yesterday.               ROS:   All other systems were reviewed and are negative.  Objective: Vital Signs: There were no vitals taken for this visit.  Physical Exam:  General:  Alert and oriented, in no acute distress. Pulm:  Breathing unlabored. Psy:  Normal mood, congruent affect. Skin:  Bilateral knees with no bruising, rashes or erythema.   Right knee Exam:  General: Antalgic gait, favoring right knee.  Standing exam: No varus or valgus deformity of the knee.   Seated Exam:  Mild patellar crepitus, Negative J-Sign.   Palpation: endorses tenderness to palpation over medial >> lateral joint lines. Tenderness with palpation of patella as well as patellar facets. No  tenderness over patellar tendon.   Supine exam: Trace effusion, normal patellar mobility.   Ligamentous Exam:  No pain or laxity with anterior/posterior drawer.  No obvious Sag.  No pain or laxity with varus/valgus stress across the knee.   Meniscus:  McMurray with no pain or deep clicking.  Thessaly negative.   Strength: Hip flexion (L1), Hip Aduction (L2), Knee Extension (L3) are 5/5 Bilaterally Foot Inversion (L4), Dorsiflexion (L5), and Eversion (S1) 5/5 Bilaterally  Sensation: Intact to light touch medial and lateral aspects of lower extremities, and lateral, dorsal, and medial aspects of foot.    Imaging: XR Knee 1-2 Views Left  Result Date: 03/25/2020 X-rays of the left knee reveal moderate patellofemoral spurring, moderate medial compartment narrowing and. Tubular spurring.  XR Knee 1-2 Views Right  Result Date: 03/25/2020 X-rays of the right knee reveal moderate patellofemoral spurring and medial compartment narrowing and periarticular spurring. No sign of acute fracture.   Assessment & Plan: 64yo F presenting to clinic with concerns of R>>L knee pain. Examination and Xrays concerning for moderate degenerative arthritis within bilateral knees. Doubt due to Crestor, however she can take a medication holiday from this, then try to resume. If symptoms recur, likely statin induced. Otherwise she should be safe to continue.  - Discussed HEP she can perform for knee strength/stabilization - Discussed OTC supplements she can take  for joint health - Will try Voltaren Gel for symptomatic treatment - Declines injection therapy at this time, though may consider in the future if her pain worsens - Patient expresses understanding, and had no further questions today.      Procedures: No procedures performed  No notes on file     PMFS History: Patient Active Problem List   Diagnosis Date Noted  . Hypertension 02/06/2020  . Hyperlipidemia 01/09/2020  . CKD (chronic kidney  disease) stage 3, GFR 30-59 ml/min (HCC) 11/01/2019  . Hypertensive crisis without congestive heart failure 10/06/2019  . Chronic diastolic CHF (congestive heart failure) (HCC) 10/06/2019  . Hypertensive urgency 09/29/2019  . Elevated troponin 09/29/2019  . Hypokalemia 09/29/2019   Past Medical History:  Diagnosis Date  . Anxiety   . CKD (chronic kidney disease) stage 3, GFR 30-59 ml/min (HCC) 11/01/2019  . Hypertension   . Iron (Fe) deficiency anemia     Family History  Problem Relation Age of Onset  . CVA Mother   . Hypertension Mother   . Hypertension Sister   . CVA Sister   . Hypertension Brother   . Hypertension Maternal Grandmother   . Kidney failure Neg Hx     Past Surgical History:  Procedure Laterality Date  . CESAREAN SECTION     Social History   Occupational History  . Occupation: retired  Tobacco Use  . Smoking status: Never Smoker  . Smokeless tobacco: Never Used  Substance and Sexual Activity  . Alcohol use: Not Currently    Comment: rare  . Drug use: Never  . Sexual activity: Not on file

## 2020-03-25 NOTE — Patient Instructions (Signed)
    Glucosamine Sulfate 1,000-1,500 mg twice daily  Turmeric:  500 mg twice daily  Boswellia:  As directed on bottle.   Voltaren Gel can be used topically.

## 2020-03-28 ENCOUNTER — Encounter: Payer: Self-pay | Admitting: Cardiovascular Disease

## 2020-03-28 DIAGNOSIS — R0789 Other chest pain: Secondary | ICD-10-CM

## 2020-03-28 HISTORY — DX: Other chest pain: R07.89

## 2020-03-30 ENCOUNTER — Telehealth: Payer: Self-pay

## 2020-03-30 DIAGNOSIS — Z0000 Encounter for general adult medical examination without abnormal findings: Secondary | ICD-10-CM

## 2020-03-30 NOTE — Telephone Encounter (Signed)
Called the patient to inform her that she completed the 16-week [employer] program and that she does not have to continue tracking his BP in the app. Patient is aware that she is to continue keeping a paper log of her BP readings to bring to her follow up appointments with Dr. [name]. Patient received her BP cuff from the office.  Patient inquired about being referred to the PREP program. Patient stated that it has been about three weeks and she has not heard anything and was told that it would be a week before she would be contacted. Care Guide will reach out to inform [facility] of patient's interest in the program.

## 2020-04-03 ENCOUNTER — Telehealth: Payer: Self-pay

## 2020-04-03 NOTE — Telephone Encounter (Signed)
VMT pt requesting call back to discuss PREP referral 

## 2020-04-03 NOTE — Telephone Encounter (Signed)
[facility] pt Returned call Explained PREP to patient Anxious to get started  Experiencing knee pain and wants to reduce weight Already exploring ways to lose weight Able to start in Jan T/TH 1-215pm class Will call back to schedule in take Asked for a place to get started to reduce calories Wanted to do a liquid diet Suggested starting with atleast 2 1/2 c of vegetables per day. Avoid white flour and dairy. Ensure added sugars are below 24. Water at 1/2 body weight in ounces. Keeping Na below 1500 per day Not able to work out yet since knees are flaring. Will be seeing a ortho Has number for call if needed

## 2020-04-23 ENCOUNTER — Telehealth: Payer: Self-pay

## 2020-04-23 NOTE — Telephone Encounter (Signed)
VMT pt requesting call back reference PREP class starting on 05/05/20 1pm to 215pm

## 2020-04-27 ENCOUNTER — Telehealth: Payer: Self-pay

## 2020-04-27 NOTE — Telephone Encounter (Signed)
Received text message from pt this am. Unable to start PREP right now due to loss in family.  Texted back to reach to me when things settle a bit in her family.

## 2020-04-28 ENCOUNTER — Telehealth: Payer: Self-pay | Admitting: Cardiovascular Disease

## 2020-04-28 NOTE — Telephone Encounter (Signed)
Spoke with the pt, who is confused why our office called to notify her of blood work results yesterday (12/27) when she has not had any recent blood work (BMET we called about appears to be from 02/24/20). She states she has seen Dr. [name] subsequent to these labs being drawn. She is questioning whether her medications (specifically rosuvastatin) need to be reevaluated in light of the decreased kidney function demonstrated with this blood work. Apologized to pt for any confusion and advised that Dr. [name] is planning to repeat blood work at her next visit in January. Pt would like to confirm with Dr. [name] if any action with respect to medications is required prior to that visit. Advised that Dr. [name] and her nurse are not in the office today, but we would reach out as soon as possible. Will forward to MD.

## 2020-04-28 NOTE — Telephone Encounter (Signed)
Patient returning a call about lab results from yesterday. Please call back

## 2020-05-04 NOTE — Telephone Encounter (Signed)
Spoke with patient and advised no change in medications and will recheck labs at follow up

## 2020-05-11 ENCOUNTER — Encounter: Payer: Self-pay | Admitting: Cardiovascular Disease

## 2020-05-11 ENCOUNTER — Ambulatory Visit (INDEPENDENT_AMBULATORY_CARE_PROVIDER_SITE_OTHER): Payer: BC Managed Care – PPO | Admitting: Cardiovascular Disease

## 2020-05-11 ENCOUNTER — Other Ambulatory Visit: Payer: Self-pay

## 2020-05-11 VITALS — BP 154/68 | HR 84 | Ht 64.0 in | Wt 188.0 lb

## 2020-05-11 DIAGNOSIS — E785 Hyperlipidemia, unspecified: Secondary | ICD-10-CM | POA: Diagnosis not present

## 2020-05-11 DIAGNOSIS — I10 Essential (primary) hypertension: Secondary | ICD-10-CM | POA: Diagnosis not present

## 2020-05-11 DIAGNOSIS — N1831 Chronic kidney disease, stage 3a: Secondary | ICD-10-CM

## 2020-05-11 DIAGNOSIS — I5032 Chronic diastolic (congestive) heart failure: Secondary | ICD-10-CM | POA: Diagnosis not present

## 2020-05-11 MED ORDER — IRBESARTAN 150 MG PO TABS
150.0000 mg | ORAL_TABLET | Freq: Two times a day (BID) | ORAL | 3 refills | Status: DC
Start: 2020-05-11 — End: 2020-10-12

## 2020-05-11 MED ORDER — ROSUVASTATIN CALCIUM 10 MG PO TABS: 10.0000 mg | ORAL_TABLET | Freq: Every day | ORAL | 3 refills | Status: DC

## 2020-05-11 NOTE — Patient Instructions (Signed)
Medication Instructions:  RESTART ROSUVASTATIN   Labwork: NONE   Testing/Procedures: NONE   Follow-Up:  08/03/2020 AT 8:00 AM   Any Other Special Instructions Will Be Listed Below (If Applicable).  WORK HARDER ON DIET AND EXERCISE

## 2020-05-11 NOTE — Progress Notes (Signed)
Hypertension Clinic Initial Assessment:    Date:  05/11/2020   ID:  SHAKEIRA RHEE, DOB [birth year], MRN [id number]  PCP:  Hoy Register, MD  Cardiologist:  No primary care provider on file.  Nephrologist:  Referring MD: Hoy Register, MD   CC: Hypertension  History of Present Illness:    April [name] is a 65 y.o. female with a hx of hypertension, chronic diastolic heart failure and obesity here to establish care in the hypertension clinic. She was first diagnosed approximately 10 years ago.  She is always struggled to control it.  She has had multiple hospitalizations for hypertensive urgency.  The most recent was 10/07/2019.  Hydralazine was increased and she was started on labetalol instead of metoprolol.  She had renal artery Dopplers that showed 1 to 59% stenosis on the right and normal on the left.  A.m. cortisol was within normal limits.  Renal and aldosterone levels were not consistent with hyperaldosteronism.  She also had a hospitalization 08/2019.  During that hospitalization her home hydrochlorothiazide and lisinopril were held due to a slight increase in her creatinine.  She saw Dr. Milas Gain on 6/24 and was started on irbesartan.  Since then her BP has still been high at home but labile.  She thinks that her blood pressure is related to stress.  It has been as high as the 170s over 60s.  She took her irbesartan and then rechecked it several hours later and there was no change.  Overall she follows a DASH diet and limits her sodium intake to 1500 mg daily.  She does not think she is particularly salt sensitive.  She has no caffeine or alcohol in her diet.  She snores but does not have apnea.  She does not feel rested in the morning, but she thinks this is because she does not sleep for many hours.  She has no daytime somnolence.  She has not been exercising lately because her blood pressure has not been controlled and she is afraid it will go very high.  She notes that overall her  blood pressure tends to be higher in the mornings and worse later in the day.  She struggles with palpitations and thinks this may also be stress related.  Her husband has coronary artery disease and has had prior heart attacks.  Her mother-in-law is in a long-term care facility and finding it.  Her mother and sister both had strokes with residual deficits.  She helps to try and take care of all of them.  At her first appointment hydralazine was stopped and she was started on irbestartan and HCTZ.  This was then switched to chlorthalidone and irbestartan was increased.  Her BP has ranged 120-140 and her heart rate has been 60-70s.  She saw Raquel on 10/7 and her BP was still elevated so HCTZ was switched to chlorthalidone.  They also noted that her BP runs higher with a small cuff  She has struggled with gout in her R knee and hasn't been exercising as much.  She continues to have mild, sporadic chest pain that lasts for 1-2 minutes.  It occurs at rest.  It never occurs with exertion.  At her last appointment Ms. France's pressure was not quite at goal but improving. She wants to continue working on lifestyle interventions. She was referred to the PREP exercise program through the [facility].  She called the office complaining of joint pain and reviewed the statin was held for 2 weeks.  She  has no chang in her symptoms and was diagnosed with arthritis.  She was given Voltaran and it isn't helping.  They recommended strengthening exercises.  She does this, walks and does light weights and stretching.  Her diet has been very good.  She is moving towards a plant-based diet.  She hasn't had any chest pain or pressure.  Her BP has been 120-160s/60-70s at home.  It is trending down.  She attributes the elevation to stress form her mother-in-law passing.    Previous antihypertensives: Metoprolol Labetalol Clonidine Lisinopril/hctz amlodipine  Past Medical History:  Diagnosis Date  . Anxiety   . Atypical chest  pain 03/28/2020  . CKD (chronic kidney disease) stage 3, GFR 30-59 ml/min (HCC) 11/01/2019  . Hypertension   . Iron (Fe) deficiency anemia     Past Surgical History:  Procedure Laterality Date  . CESAREAN SECTION      Current Medications: Current Meds  Medication Sig  . chlorthalidone (HYGROTON) 25 MG tablet Take 1 tablet (25 mg total) by mouth daily. TO REPLACE HYDROCHLOROTHIAZIDE  . diclofenac Sodium (VOLTAREN) 1 % GEL Apply 4 g topically 4 (four) times daily as needed.  . labetalol (NORMODYNE) 200 MG tablet Take 1 tablet (200 mg total) by mouth 2 (two) times daily.  . [DISCONTINUED] irbesartan (AVAPRO) 150 MG tablet Take 1 tablet (150 mg total) by mouth 2 (two) times daily.     Allergies:   Amlodipine, Aspirin, Clonidine derivatives, Darvon [propoxyphene hcl], Lisinopril, Toprol xl [metoprolol], and Valium [diazepam]   Social History   Socioeconomic History  . Marital status: Married    Spouse name: Not on file  . Number of children: Not on file  . Years of education: Not on file  . Highest education level: Not on file  Occupational History  . Occupation: retired  Tobacco Use  . Smoking status: Never Smoker  . Smokeless tobacco: Never Used  Substance and Sexual Activity  . Alcohol use: Not Currently    Comment: rare  . Drug use: Never  . Sexual activity: Not on file  Other Topics Concern  . Not on file  Social History Narrative  . Not on file   Social Determinants of Health   Financial Resource Strain: Not on file  Food Insecurity: Not on file  Transportation Needs: Not on file  Physical Activity: Not on file  Stress: Stress Concern Present  . Feeling of Stress : Very much  Social Connections: Not on file     Family History: The patient's family history includes CVA in her mother and sister; Hypertension in her brother, maternal grandmother, mother, and sister. There is no history of Kidney failure.  ROS:   Please see the history of present illness.      All other systems reviewed and are negative.  EKGs/Labs/Other Studies Reviewed:    EKG:  EKG is not ordered today.  The ekg ordered 10/08/19 demonstrates sinus tachycardia.  Rate 122 bpm.    Recent Labs: 09/29/2019: Magnesium 1.9; TSH 1.006 10/06/2019: B Natriuretic Peptide 37.7 10/08/2019: Hemoglobin 13.0; Platelets 299 12/10/2019: ALT 10 02/24/2020: BUN 19; Creatinine, Ser 1.31; Potassium 4.3; Sodium 144   Recent Lipid Panel    Component Value Date/Time   CHOL 158 12/10/2019 0905   TRIG 99 12/10/2019 0905   HDL 44 12/10/2019 0905   CHOLHDL 3.6 12/10/2019 0905   LDLCALC 96 12/10/2019 0905    Physical Exam:    VS:  BP (!) 154/68   Pulse 84   Ht 5'  4" (1.626 m)   Wt 188 lb (85.3 kg)   BMI 32.27 kg/m  , BMI Body mass index is 32.27 kg/m. GENERAL:  Well appearing HEENT: Pupils equal round and reactive, fundi not visualized, oral mucosa unremarkable NECK:  No jugular venous distention, waveform within normal limits, carotid upstroke brisk and symmetric, no bruits LUNGS:  Clear to auscultation bilaterally HEART:  RRR.  PMI not displaced or sustained,S1 and S2 within normal limits, no S3, no S4, no clicks, no rubs, no murmurs ABD:  Flat, positive bowel sounds normal in frequency in pitch, no bruits, no rebound, no guarding, no midline pulsatile mass, no hepatomegaly, no splenomegaly EXT:  2 plus pulses throughout, no edema, no cyanosis no clubbing SKIN:  No rashes no nodules NEURO:  Cranial nerves II through XII grossly intact, motor grossly intact throughout PSYCH:  Cognitively intact, oriented to person place and time   ASSESSMENT:    1. Chronic diastolic CHF (congestive heart failure) (HCC)   2. Hypertension, unspecified type   3. Stage 3a chronic kidney disease (HCC)   4. Hyperlipidemia, unspecified hyperlipidemia type     PLAN:    # Essential hypertension:  Her blood pressure was doing better but has been elevated recently.  She attributes this to the stress of loss of a  family member.  She would like to work on diet and exercise rather than adding any additional medications at this time.  She will continue tracking her blood pressure and we will reassess in 3 months after these interventions.  Has been much better controlled and has mostly been controlled.  She is interested in enrolling in the PREP program through the [facility] and would rather wait than add any additional medications at this time.  Continue chlorthalidone, irbesartan, and labetalol.  Secondary Causes of Hypertension  Medications/Herbal: OCP, steroids, stimulants, antidepressants, weight loss medication, immune suppressants, NSAIDs, sympathomimetics, alcohol, caffeine, licorice, ginseng, St. John's wort, chemo (none apply) Sleep Apnea (testing not indicated) Renal artery stenosis (1-59% on R 10/2019.  L normal) Hyperaldosteronism (negative 10/2019) Hyper/hypothyroidism (TSH normal 08/2019) Pheochromocytoma: (negative 10/2019) Cushing's syndrome: cortisol 7.9 Coarctation of the aorta (symmetric BP)  # Atypical chest pain: # Hyperlipidemia: Symptoms have resolved.  Her muscle aches did not improve after stopping her statin.  We will resume rosuvastatin.  Disposition:    FU with MD/PharmD in 3 months   Medication Adjustments/Labs and Tests Ordered: Current medicines are reviewed at length with the patient today.  Concerns regarding medicines are outlined above.  No orders of the defined types were placed in this encounter.  Meds ordered this encounter  Medications  . irbesartan (AVAPRO) 150 MG tablet    Sig: Take 1 tablet (150 mg total) by mouth 2 (two) times daily.    Dispense:  180 tablet    Refill:  3  . rosuvastatin (CRESTOR) 10 MG tablet    Sig: Take 1 tablet (10 mg total) by mouth daily.    Dispense:  90 tablet    Refill:  3     Signed, Chilton Si, MD  05/11/2020 8:48 AM    [facility]

## 2020-05-20 ENCOUNTER — Other Ambulatory Visit: Payer: Self-pay | Admitting: Cardiovascular Disease

## 2020-05-20 DIAGNOSIS — I160 Hypertensive urgency: Secondary | ICD-10-CM

## 2020-07-03 ENCOUNTER — Telehealth: Payer: Self-pay

## 2020-07-03 NOTE — Telephone Encounter (Signed)
Call placed to pt to ask if she wanted to start with the 07/13/20 class MW [id number]

## 2020-07-09 ENCOUNTER — Encounter: Payer: Self-pay | Admitting: Cardiovascular Disease

## 2020-08-03 ENCOUNTER — Ambulatory Visit: Payer: BC Managed Care – PPO | Admitting: Cardiovascular Disease

## 2020-08-14 ENCOUNTER — Telehealth: Payer: Self-pay | Admitting: Cardiovascular Disease

## 2020-08-14 ENCOUNTER — Other Ambulatory Visit: Payer: Self-pay | Admitting: Cardiovascular Disease

## 2020-08-14 DIAGNOSIS — I160 Hypertensive urgency: Secondary | ICD-10-CM

## 2020-08-14 MED ORDER — LABETALOL HCL 200 MG PO TABS: 200.0000 mg | ORAL_TABLET | Freq: Two times a day (BID) | ORAL | 2 refills | Status: DC

## 2020-08-14 NOTE — Telephone Encounter (Signed)
*  STAT* If patient is at the pharmacy, call can be transferred to refill team.   1. Which medications need to be refilled? (please list name of each medication and dose if known) labetalol (NORMODYNE) 200 MG tablet  2. Which pharmacy/location (including street and city if local pharmacy) is medication to be sent to? labetalol (NORMODYNE) 200 MG tablet  3. Do they need a 30 day or 90 day supply? 90

## 2020-08-14 NOTE — Telephone Encounter (Signed)
Let patient know that I refilled her Labetalol.

## 2020-08-26 ENCOUNTER — Ambulatory Visit: Payer: BC Managed Care – PPO | Admitting: Cardiovascular Disease

## 2020-08-26 ENCOUNTER — Telehealth: Payer: Self-pay | Admitting: Cardiovascular Disease

## 2020-08-26 NOTE — Telephone Encounter (Signed)
Pt is calling back to request a provide switch from Dr. [name] to Dr. Mayford Knife A provider switch was put in earlier today but pt feels that Dr. Mayford Knife is a better fit for her due to her sleep problems

## 2020-08-26 NOTE — Telephone Encounter (Signed)
New Message:     Pt would like to switch from Dr [name] to Dr [name]. She does not want to go to the new location, Is this alright with you both?

## 2020-09-09 ENCOUNTER — Telehealth: Payer: Self-pay | Admitting: Cardiovascular Disease

## 2020-09-09 DIAGNOSIS — I160 Hypertensive urgency: Secondary | ICD-10-CM

## 2020-09-09 MED ORDER — LABETALOL HCL 200 MG PO TABS: 200.0000 mg | ORAL_TABLET | Freq: Two times a day (BID) | ORAL | 1 refills | Status: DC

## 2020-09-09 NOTE — Telephone Encounter (Signed)
*  STAT* If patient is at the pharmacy, call can be transferred to refill team.   1. Which medications need to be refilled? (please list name of each medication and dose if known)  labetalol (NORMODYNE) 200 MG tablet  2. Which pharmacy/location (including street and city if local pharmacy) is medication to be sent to? [id number] - [place] ([place]), [place] - [address]  3. Do they need a 30 day or 90 day supply? 90 day supply

## 2020-09-09 NOTE — Telephone Encounter (Signed)
Called patient to verify the dosage and frequency of her Labetalol. Mrs. Streetman confirmed that she take 1 tablet (200 mg) in the mornings and 1 tablet in the evenings. Rx was sent for 200 mg BID to [facility] on [address]. She verbalized understanding and all questions (If any) were answered.

## 2020-10-10 ENCOUNTER — Other Ambulatory Visit: Payer: Self-pay | Admitting: Cardiovascular Disease

## 2020-10-15 ENCOUNTER — Telehealth: Payer: Self-pay | Admitting: Cardiovascular Disease

## 2020-10-15 NOTE — Telephone Encounter (Signed)
*  STAT* If patient is at the pharmacy, call can be transferred to refill team.   1. Which medications need to be refilled? (please list name of each medication and dose if known) Irbesartan  2. Which pharmacy/location (including street and city if local pharmacy) is medication to be sent to?[facility]  [place], [place]*  3. Do they need a 30 day or 90 day supply? 90 days and refills

## 2021-02-25 ENCOUNTER — Telehealth: Payer: Self-pay | Admitting: Internal Medicine

## 2021-02-25 DIAGNOSIS — I160 Hypertensive urgency: Secondary | ICD-10-CM

## 2021-02-25 MED ORDER — LABETALOL HCL 200 MG PO TABS: 200.0000 mg | ORAL_TABLET | Freq: Two times a day (BID) | ORAL | 1 refills | Status: DC

## 2021-02-25 NOTE — Telephone Encounter (Signed)
*  STAT* If patient is at the pharmacy, call can be transferred to refill team.   1. Which medications need to be refilled? (please list name of each medication and dose if known)  labetalol (NORMODYNE) 200 MG tablet  2. Which pharmacy/location (including street and city if local pharmacy) is medication to be sent to? [facility] - [place] ([place]), [place] - [address]  3. Do they need a 30 day or 90 day supply? 90 day supply   

## 2021-02-25 NOTE — Telephone Encounter (Signed)
Refill sent to pharmacy.   

## 2021-04-07 ENCOUNTER — Other Ambulatory Visit: Payer: Self-pay | Admitting: Cardiovascular Disease

## 2021-04-07 DIAGNOSIS — I160 Hypertensive urgency: Secondary | ICD-10-CM

## 2021-05-13 ENCOUNTER — Telehealth: Payer: Self-pay | Admitting: Internal Medicine

## 2021-05-13 ENCOUNTER — Emergency Department (HOSPITAL_COMMUNITY)
Admission: EM | Admit: 2021-05-13 | Discharge: 2021-05-13 | Disposition: A | Discharge status: Home / Self Care | Payer: Medicare Other | Attending: Emergency Medicine | Admitting: Emergency Medicine

## 2021-05-13 ENCOUNTER — Encounter (HOSPITAL_COMMUNITY): Payer: Self-pay

## 2021-05-13 ENCOUNTER — Other Ambulatory Visit: Payer: Self-pay

## 2021-05-13 ENCOUNTER — Emergency Department (HOSPITAL_COMMUNITY): Payer: Medicare Other

## 2021-05-13 DIAGNOSIS — Z79899 Other long term (current) drug therapy: Secondary | ICD-10-CM | POA: Diagnosis not present

## 2021-05-13 DIAGNOSIS — R739 Hyperglycemia, unspecified: Secondary | ICD-10-CM | POA: Insufficient documentation

## 2021-05-13 DIAGNOSIS — I674 Hypertensive encephalopathy: Secondary | ICD-10-CM

## 2021-05-13 DIAGNOSIS — R519 Headache, unspecified: Secondary | ICD-10-CM | POA: Diagnosis present

## 2021-05-13 DIAGNOSIS — I160 Hypertensive urgency: Secondary | ICD-10-CM

## 2021-05-13 DIAGNOSIS — I5030 Unspecified diastolic (congestive) heart failure: Secondary | ICD-10-CM | POA: Insufficient documentation

## 2021-05-13 DIAGNOSIS — Z20822 Contact with and (suspected) exposure to covid-19: Secondary | ICD-10-CM | POA: Insufficient documentation

## 2021-05-13 DIAGNOSIS — I110 Hypertensive heart disease with heart failure: Secondary | ICD-10-CM | POA: Diagnosis not present

## 2021-05-13 LAB — CBC WITH DIFFERENTIAL/PLATELET
Abs Immature Granulocytes: 0.01 10*3/uL (ref 0.00–0.07)
Basophils Absolute: 0 10*3/uL (ref 0.0–0.1)
Basophils Relative: 0 %
Eosinophils Absolute: 0.2 10*3/uL (ref 0.0–0.5)
Eosinophils Relative: 5 %
HCT: 41.1 % (ref 36.0–46.0)
Hemoglobin: 14.2 g/dL (ref 12.0–15.0)
Immature Granulocytes: 0 %
Lymphocytes Relative: 27 %
Lymphs Abs: 1.3 10*3/uL (ref 0.7–4.0)
MCH: 30 pg (ref 26.0–34.0)
MCHC: 34.5 g/dL (ref 30.0–36.0)
MCV: 86.9 fL (ref 80.0–100.0)
Monocytes Absolute: 0.4 10*3/uL (ref 0.1–1.0)
Monocytes Relative: 7 %
Neutro Abs: 3 10*3/uL (ref 1.7–7.7)
Neutrophils Relative %: 61 %
Platelets: 229 10*3/uL (ref 150–400)
RBC: 4.73 MIL/uL (ref 3.87–5.11)
RDW: 11.9 % (ref 11.5–15.5)
WBC: 4.9 10*3/uL (ref 4.0–10.5)
nRBC: 0 % (ref 0.0–0.2)

## 2021-05-13 LAB — COMPREHENSIVE METABOLIC PANEL
ALT: 15 U/L (ref 0–44)
AST: 24 U/L (ref 15–41)
Albumin: 4.4 g/dL (ref 3.5–5.0)
Alkaline Phosphatase: 97 U/L (ref 38–126)
Anion gap: 13 (ref 5–15)
BUN: 14 mg/dL (ref 8–23)
CO2: 23 mmol/L (ref 22–32)
Calcium: 10.2 mg/dL (ref 8.9–10.3)
Chloride: 104 mmol/L (ref 98–111)
Creatinine, Ser: 1.14 mg/dL — ABNORMAL HIGH (ref 0.44–1.00)
GFR, Estimated: 53 mL/min — ABNORMAL LOW (ref >60)
Glucose, Bld: 113 mg/dL — ABNORMAL HIGH (ref 70–99)
Potassium: 4.2 mmol/L (ref 3.5–5.1)
Sodium: 140 mmol/L (ref 135–145)
Total Bilirubin: 1 mg/dL (ref 0.3–1.2)
Total Protein: 7.7 g/dL (ref 6.5–8.1)

## 2021-05-13 LAB — CBG MONITORING, ED: Glucose-Capillary: 122 mg/dL — ABNORMAL HIGH (ref 70–99)

## 2021-05-13 LAB — URINALYSIS, ROUTINE W REFLEX MICROSCOPIC
Bilirubin Urine: NEGATIVE
Glucose, UA: NEGATIVE mg/dL
Ketones, ur: NEGATIVE mg/dL
Leukocytes,Ua: NEGATIVE
Nitrite: NEGATIVE
Protein, ur: 30 mg/dL — AB
Specific Gravity, Urine: 1.01 (ref 1.005–1.030)
pH: 6 (ref 5.0–8.0)

## 2021-05-13 LAB — RAPID URINE DRUG SCREEN, HOSP PERFORMED
Amphetamines: NOT DETECTED
Barbiturates: NOT DETECTED
Benzodiazepines: NOT DETECTED
Cocaine: NOT DETECTED
Opiates: NOT DETECTED
Tetrahydrocannabinol: NOT DETECTED

## 2021-05-13 LAB — APTT: aPTT: 28 seconds (ref 24–36)

## 2021-05-13 LAB — I-STAT CHEM 8, ED
BUN: 18 mg/dL (ref 8–23)
Calcium, Ion: 1.12 mmol/L — ABNORMAL LOW (ref 1.15–1.40)
Chloride: 107 mmol/L (ref 98–111)
Creatinine, Ser: 1 mg/dL (ref 0.44–1.00)
Glucose, Bld: 113 mg/dL — ABNORMAL HIGH (ref 70–99)
HCT: 43 % (ref 36.0–46.0)
Hemoglobin: 14.6 g/dL (ref 12.0–15.0)
Potassium: 4.2 mmol/L (ref 3.5–5.1)
Sodium: 139 mmol/L (ref 135–145)
TCO2: 24 mmol/L (ref 22–32)

## 2021-05-13 LAB — URINALYSIS, MICROSCOPIC (REFLEX)
Bacteria, UA: NONE SEEN
RBC / HPF: NONE SEEN RBC/hpf (ref 0–5)
Squamous Epithelial / HPF: NONE SEEN (ref 0–5)

## 2021-05-13 LAB — RESP PANEL BY RT-PCR (FLU A&B, COVID) ARPGX2
Influenza A by PCR: NEGATIVE
Influenza B by PCR: NEGATIVE
SARS Coronavirus 2 by RT PCR: NEGATIVE

## 2021-05-13 LAB — ETHANOL: Alcohol, Ethyl (B): <10 mg/dL (ref <10)

## 2021-05-13 LAB — PROTIME-INR
INR: 1 (ref 0.8–1.2)
Prothrombin Time: 13 seconds (ref 11.4–15.2)

## 2021-05-13 MED ORDER — LABETALOL HCL 200 MG PO TABS
200.0000 mg | ORAL_TABLET | Freq: Once | ORAL | Status: AC
  Administered 2021-05-13: 200 mg via ORAL
  Filled 2021-05-13: qty 1

## 2021-05-13 MED ORDER — CHLORTHALIDONE 25 MG PO TABS
25.0000 mg | ORAL_TABLET | Freq: Once | ORAL | Status: AC
  Administered 2021-05-13: 25 mg via ORAL
  Filled 2021-05-13: qty 1

## 2021-05-13 MED ORDER — LABETALOL HCL 5 MG/ML IV SOLN
10.0000 mg | Freq: Once | INTRAVENOUS | Status: DC
  Filled 2021-05-13: qty 4

## 2021-05-13 MED ORDER — NICARDIPINE HCL IN NACL 20-0.86 MG/200ML-% IV SOLN: 3.0000 mg/h | INTRAVENOUS | Status: DC

## 2021-05-13 NOTE — Telephone Encounter (Signed)
Spoke to patient she stated she went to [facility] ED this morning with elevated B/P.She was told to see Dr.[place] soon.Stated she was going to change over to Dr.Acharya due to not wanting to go to [place] location but she wants to see Dr.Graball.Appointment scheduled with Dr.[place]o 1/13 at 2:20 pm.Advised to bring a list of all medications.Directions given.

## 2021-05-13 NOTE — ED Provider Notes (Signed)
7:32 AM Care transferred to me. Patient with headache, speech difficulties and elevated BP. Neurology is currently recommending goal of 180 SBP, and see if MRI shows stroke. Will try labetalol, home po meds, and see what MRI shows. Discussed with day neuro MD when MRI comes back.  9:14 AM MRI personally reviewed. No obvious infarct on my view or radiology's. Labs have been reviewed and there is no acute kidney injury or other emergent pathology on my interpretation.  At this point, the patient is alert and oriented to person, place, time, situation.  Her blood pressure has steadily improved down to 159/93.  This seem to be from just her taking her morning meds that she had not taken an time.  Still has a little bit of a headache but no emergent findings present.  Highly doubt CNS emergency such as infection.  No evidence of PRES. given her significant blood pressure improvement, I do not think she needs IV meds or further emergent titration and is stable for outpatient management with her cardiologist.  We discussed return precautions.   Sherwood Gambler, MD 05/13/21 [phone]

## 2021-05-13 NOTE — Consult Note (Signed)
Neurology Consultation  Reason for Consult: Code stroke Referring Physician: Weakness  CC: Weakness, difficulty with speech  History is obtained from: Chart, patient, EMS  HPI: April [name] is a 66 y.o. female past medical history of uncontrolled hypertension with prior admissions for hypertensive urgency, anxiety, CKD, presented from home where EMS was called for evaluation of extremely elevated blood pressure in the systolic 210-220 range.  The patient's husband had reported to EMS that she woke up normal around 4 AM but had extremely high blood pressures and her medications were not able to take the blood pressure down.  When the EMS arrived at the patient's house, she was having trouble with communication-slow to respond to questions, barely could tell them her name, could not tell her date of birth.  No focal weakness noted.  Due to her speech symptoms, code stroke was activated and she was brought into the emergency room as an acute code stroke. See my detailed examination below.  She was taken for stat CT head which was negative for acute process. CTA head and neck was performed due to low suspicion for a LVO stroke and her CKD.    LKW: 4 AM to the best of history taking but might have been last night. tpa given?: no, nonfocal symptoms Premorbid modified Rankin scale (mRS):0   FBP:ZWCHEN to obtain due to altered mental status.   Past Medical History:  Diagnosis Date   Anxiety    Atypical chest pain 03/28/2020   CKD (chronic kidney disease) stage 3, GFR 30-59 ml/min (HCC) 11/01/2019   Hypertension    Iron (Fe) deficiency anemia     Family History  Problem Relation Age of Onset   CVA Mother    Hypertension Mother    Hypertension Sister    CVA Sister    Hypertension Brother    Hypertension Maternal Grandmother    Kidney failure Neg Hx    Social History:   reports that she has never smoked. She has never used smokeless tobacco. She reports that she does not currently use  alcohol. She reports that she does not use drugs.  Medications No current facility-administered medications for this encounter.  Current Outpatient Medications:    chlorthalidone (HYGROTON) 25 MG tablet, Take 1 tablet (25 mg total) by mouth daily. TO REPLACE HYDROCHLOROTHIAZIDE, Disp: 30 tablet, Rfl: 3   diclofenac Sodium (VOLTAREN) 1 % GEL, Apply 4 g topically 4 (four) times daily as needed., Disp: 500 g, Rfl: 6   irbesartan (AVAPRO) 150 MG tablet, Take 1 tablet by mouth twice daily, Disp: 180 tablet, Rfl: 3   labetalol (NORMODYNE) 200 MG tablet, Take 1 tablet by mouth twice daily, Disp: 90 tablet, Rfl: 3   rosuvastatin (CRESTOR) 10 MG tablet, Take 1 tablet (10 mg total) by mouth daily., Disp: 90 tablet, Rfl: 3   Exam: Current vital signs: There were no vitals taken for this visit. Vital signs in last 24 hours:   According to EMS her systolics were 2 10-2 20 range. General: Awake alert in no distress HEENT: Normocephalic/atraumatic Lungs: Clear Cardiovascular: Regular rhythm Abdomen nondistended nontender Extremities warm well perfused Neurological exam She is awake alert. Speech and language: Able to tell me her first name, unable to tell me her date of birth, able to name simple objects such as a watch and a pen but not able to name the thumb-kept saying it is a finger.  No dysarthria.  Difficulty with repetition and comprehension noted Cranial nerves: Pupils equal round react light,  extraocular movements intact, visual fields full, facial sensation intact, face symmetric, tongue and palate midline. Motor exam with no drift in any of the 4 extremities Sensation: Intact to touch Coordination with no dysmetria NIH stroke scale 1a Level of Conscious.: 0 1b LOC Questions: 2 1c LOC Commands: 1 2 Best Gaze: 0 3 Visual: 0 4 Facial Palsy: 0 5a Motor Arm - left: 0 5b Motor Arm - Right: 0 6a Motor Leg - Left: 0 6b Motor Leg - Right: 0 7 Limb Ataxia: 0 8 Sensory: 0 9 Best  Language: 1 10 Dysarthria: 0 11 Extinct. and Inatten.: 0 TOTAL: 4    Labs I have reviewed labs in epic and the results pertinent to this consultation are:   CBC    Component Value Date/Time   WBC 4.9 05/13/2021 0641   RBC 4.73 05/13/2021 0641   HGB 14.6 05/13/2021 0648   HCT 43.0 05/13/2021 0648   PLT 229 05/13/2021 0641   MCV 86.9 05/13/2021 0641   MCH 30.0 05/13/2021 0641   MCHC 34.5 05/13/2021 0641   RDW 11.9 05/13/2021 0641   LYMPHSABS 1.3 05/13/2021 0641   MONOABS 0.4 05/13/2021 0641   EOSABS 0.2 05/13/2021 0641   BASOSABS 0.0 05/13/2021 0641    CMP     Component Value Date/Time   NA 139 05/13/2021 0648   NA 144 02/24/2020 1528   K 4.2 05/13/2021 0648   CL 107 05/13/2021 0648   CO2 23 05/13/2021 0641   GLUCOSE 113 (H) 05/13/2021 0648   BUN 18 05/13/2021 0648   BUN 19 02/24/2020 1528   CREATININE 1.00 05/13/2021 0648   CALCIUM 10.2 05/13/2021 0641   PROT 7.7 05/13/2021 0641   PROT 7.3 12/10/2019 0905   ALBUMIN 4.4 05/13/2021 0641   ALBUMIN 4.5 12/10/2019 0905   AST 24 05/13/2021 0641   ALT 15 05/13/2021 0641   ALKPHOS 97 05/13/2021 0641   BILITOT 1.0 05/13/2021 0641   BILITOT 0.4 12/10/2019 0905   GFRNONAA 53 (L) 05/13/2021 0641   GFRAA 49 (L) 02/24/2020 1528    Imaging I have reviewed the images obtained:  CT-head-no acute changes.  Aspects 10  Assessment:  66 year old woman with uncontrolled hypertension presenting from home via EMS with complaints of severely elevated blood pressure and headache and on examination some word finding difficulty. Her current systolic blood pressure in the 220s. She has no focal motor or sensory or cranial nerve deficits. Her main deficits are with her communication which I think are related to her hypertensive urgency. I would recommend getting a stat MRI to rule out an acute stroke but due to her low deficits, not a candidate for tPA. Exam not suspicious for LVO hence and she has CKD-hence did not pursue vessel  imaging.  Impression: Strokelike symptoms likely related to hypertensive urgency  Recommendations: MRI of the brain I would recommend blood pressure management with treating high blood pressure as you would for hypertensive urgency by reducing the blood pressure by no more than 20%. If the MRI shows a stroke, I would at that point recommend for permissive hypertension with treatment of high blood pressures only if systolic is greater than 220 but till then if systolic blood pressure goal of 180 or below should suffice as the suspicion for stroke is low. Plan was discussed with Dr. Preston Fleeting My team will follow the imaging with you.  If the imaging is positive for stroke, she will require full stroke work-up. Please call the on-call neurologist to review the  images once the MRI is made available.  -- Milon DikesAshish Franchelle Foskett, MD Neurologist [facility] Pager: [phone]   CRITICAL CARE ATTESTATION Performed by: Milon DikesAshish Okie Jansson, MD Total critical care time: 40 minutes Critical care time was exclusive of separately billable procedures and treating other patients and/or supervising APPs/Residents/Students Critical care was necessary to treat or prevent imminent or life-threatening deterioration due to strokelike symptoms, hypertensive urgency This patient is critically ill and at significant risk for neurological worsening and/or death and care requires constant monitoring. Critical care was time spent personally by me on the following activities: development of treatment plan with patient and/or surrogate as well as nursing, discussions with consultants, evaluation of patient's response to treatment, examination of patient, obtaining history from patient or surrogate, ordering and performing treatments and interventions, ordering and review of laboratory studies, ordering and review of radiographic studies, pulse oximetry, re-evaluation of patient's condition, participation in multidisciplinary  rounds and medical decision making of high complexity in the care of this patient.

## 2021-05-13 NOTE — ED Provider Notes (Signed)
[facility]   CSN: [id number] Arrival date & time: 05/13/21  [id number]     History  Chief complaint: Code stroke  April [name] is a 66 y.o. female.  The history is provided by the patient and the EMS personnel.  She has history of hypertension with hypertensive crisis, diastolic heart failure and is brought in as a code stroke.  She was last known well at 4 AM.  She was reported to have had some abnormal speech and weakness.  Patient is complaining of a left-sided headache but is not complaining of any weakness.  She denies nausea or vomiting.  She states her blood pressure has been running over 200 systolic and she tried increasing some of her blood pressure medication on her own without any effect.   Home Medications Prior to Admission medications   Medication Sig Start Date End Date Taking? Authorizing Provider  chlorthalidone (HYGROTON) 25 MG tablet Take 1 tablet (25 mg total) by mouth daily. TO REPLACE HYDROCHLOROTHIAZIDE 02/06/20   Chilton Siandolph, Tiffany, MD  diclofenac Sodium (VOLTAREN) 1 % GEL Apply 4 g topically 4 (four) times daily as needed. 03/25/20   Hilts, Casimiro NeedleMichael, MD  irbesartan (AVAPRO) 150 MG tablet Take 1 tablet by mouth twice daily 10/12/20   Chilton Siandolph, Tiffany, MD  labetalol (NORMODYNE) 200 MG tablet Take 1 tablet by mouth twice daily 04/07/21   Chilton Siandolph, Tiffany, MD  rosuvastatin (CRESTOR) 10 MG tablet Take 1 tablet (10 mg total) by mouth daily. 05/11/20 08/09/20  Chilton Siandolph, Tiffany, MD      Allergies    Amlodipine, Aspirin, Clonidine derivatives, Darvon [propoxyphene hcl], Lisinopril, Toprol xl [metoprolol], and Valium [diazepam]    Review of Systems   Review of Systems  All other systems reviewed and are negative.  Physical Exam Updated Vital Signs BP (!) 201/83 (BP Location: Left Arm)    Pulse 92    Temp 98.2 F (36.8 C) (Oral)    Resp 20    Ht 5\' 8"  (1.727 m)    Wt 86 kg    SpO2 98%    BMI 28.83 kg/m  Physical  Exam Vitals and nursing note reviewed.  66 year old female, resting comfortably and in no acute distress. Vital signs are significant for elevated blood pressure. Oxygen saturation is 98%, which is normal. Head is normocephalic and atraumatic. PERRLA, EOMI. Oropharynx is clear.  Fundi show no hemorrhage or exudate or papilledema. Neck is nontender and supple without adenopathy or JVD.  There are no carotid bruits. Back is nontender and there is no CVA tenderness. Lungs are clear without rales, wheezes, or rhonchi. Chest is nontender. Heart has regular rate and rhythm without murmur. Abdomen is soft, flat, nontender without masses or hepatosplenomegaly and peristalsis is normoactive. Extremities have no cyanosis or edema, full range of motion is present. Skin is warm and dry without rash. Neurologic: Awake and alert but slow to answer questions.  Objectively oriented to time, but it took her about 1 minute to get the year.  Speech is spontaneous and fluent without dysarthria.  Cranial nerves are intact.  Strength is 5/5 in all 4 extremities.  Sensation is normal throughout.  ED Results / Procedures / Treatments   Labs (all labs ordered are listed, but only abnormal results are displayed) Labs Reviewed  COMPREHENSIVE METABOLIC PANEL - Abnormal; Notable for the following components:      Result Value   Glucose, Bld 113 (*)    Creatinine, Ser 1.14 (*)  GFR, Estimated 53 (*)    All other components within normal limits  I-STAT CHEM 8, ED - Abnormal; Notable for the following components:   Glucose, Bld 113 (*)    Calcium, Ion 1.12 (*)    All other components within normal limits  CBG MONITORING, ED - Abnormal; Notable for the following components:   Glucose-Capillary 122 (*)    All other components within normal limits  RESP PANEL BY RT-PCR (FLU A&B, COVID) ARPGX2  ETHANOL  CBC WITH DIFFERENTIAL/PLATELET  PROTIME-INR  APTT  RAPID URINE DRUG SCREEN, HOSP PERFORMED  URINALYSIS, ROUTINE  W REFLEX MICROSCOPIC    EKG EKG Interpretation  Date/Time:  Thursday May 13 2021 07:00:05 EST Ventricular Rate:  89 PR Interval:  149 QRS Duration: 108 QT Interval:  367 QTC Calculation: 447 R Axis:   61 Text Interpretation: Sinus rhythm Biatrial enlargement Minimal ST depression, inferior leads similar to June 2021 Confirmed by Pricilla Loveless [phone]) on 05/13/2021 7:22:02 AM  Radiology CT HEAD CODE STROKE WO CONTRAST  Result Date: 05/13/2021 CLINICAL DATA:  Code stroke. EXAM: CT HEAD WITHOUT CONTRAST TECHNIQUE: Contiguous axial images were obtained from the base of the skull through the vertex without intravenous contrast. RADIATION DOSE REDUCTION: This exam was performed according to the departmental dose-optimization program which includes automated exposure control, adjustment of the mA and/or kV according to patient size and/or use of iterative reconstruction technique. COMPARISON:  10/08/2019 FINDINGS: Brain: No evidence of acute infarction, hemorrhage, hydrocephalus, extra-axial collection or mass lesion/mass effect. Vascular: Question high-density at the level of the ACAs but not clearly different from other vessel density or seen on all planes Skull: Normal. Negative for fracture or focal lesion. Sinuses/Orbits: Negative Other: These results were communicated to Dr. Wilford Corner at 6:56 am on 05/13/2021 by text page via the [facility] messaging system. ASPECTS [facility] Stroke Program Early CT Score) Not scored without localizing symptom. IMPRESSION: Negative for hemorrhage or visible infarct. Electronically Signed   By: Tiburcio Pea M.D.   On: 05/13/2021 06:55    Procedures Procedures  Monitor shows normal sinus rhythm per my interpretation.  Medications Ordered in ED Medications - No data to display  ED Course/ Medical Decision Making/ A&P                           Medical Decision Making  Headache in the setting of severe hypertension suggestive of hypertensive urgency.  No  evidence of stroke on exam.  Emergent CT of head showed no evidence of bleeding.  Case is seen in conjunction with Dr. Wilford Corner of neurology service, who also feels that this is hypertensive urgency and not stroke.  MRI will be ordered to rule out occult stroke, but she will need aggressive management of her blood pressure.  Old records are reviewed showing prior hospital admissions for hypertensive urgency.  She is started on a nicardipine drip.  Labs and official CT report are pending.  Case is signed out to Dr. Criss Alvine.  Official MRI report is no stroke or hemorrhage.  I have independently viewed the images and agree with radiologist interpretation.  ECG as unchanged from prior with main finding being LVH.  CBC and metabolic panel are unremarkable.  She will need to be admitted to critical care service.  Case is signed out to Dr. Criss Alvine.  CRITICAL CARE Performed by: Dione Booze Total critical care time: 45 minutes Critical care time was exclusive of separately billable procedures and treating other patients. Critical  care was necessary to treat or prevent imminent or life-threatening deterioration. Critical care was time spent personally by me on the following activities: development of treatment plan with patient and/or surrogate as well as nursing, discussions with consultants, evaluation of patient's response to treatment, examination of patient, obtaining history from patient or surrogate, ordering and performing treatments and interventions, ordering and review of laboratory studies, ordering and review of radiographic studies, pulse oximetry and re-evaluation of patient's condition.       Final Clinical Impression(s) / ED Diagnoses Final diagnoses:  Hypertensive urgency  Hypertensive encephalopathy    Rx / DC Orders ED Discharge Orders     None         Dione Booze, MD 05/13/21 [phone]

## 2021-05-13 NOTE — ED Notes (Signed)
Patient transported to MRI 

## 2021-05-13 NOTE — ED Triage Notes (Signed)
Patient arrives to ED BIB [facility] as a Code Stroke. Per EMS pt woke up at 4am from a headache and was attempting to take her HTN medication. Per EMS upon their arrival it was noted that pt had slurred speech and was altered.   BP 210/118 CBG 124

## 2021-05-13 NOTE — Telephone Encounter (Signed)
Pt c/o medication issue:  1. Name of Medication:    2. How are you currently taking this medication (dosage and times per day)?    3. Are you having a reaction (difficulty breathing--STAT)? no  4. What is your medication issue? Patient feels like her medication isnt working anymore and what to have it change. She states that is all of her medication that are prescribe. Please advise

## 2021-05-13 NOTE — ED Notes (Signed)
Ambulatory to bathroom. Gait steady. No signs of distress. No weakness

## 2021-05-13 NOTE — Discharge Instructions (Signed)
If you develop continued, recurrent, or worsening headache, fever, neck stiffness, vomiting, blurry or double vision, confusion, weakness or numbness in your arms or legs, trouble speaking, or any other new/concerning symptoms then return to the ER for evaluation.

## 2021-05-14 ENCOUNTER — Ambulatory Visit (INDEPENDENT_AMBULATORY_CARE_PROVIDER_SITE_OTHER): Payer: Medicare Other | Admitting: Cardiovascular Disease

## 2021-05-14 ENCOUNTER — Encounter (HOSPITAL_BASED_OUTPATIENT_CLINIC_OR_DEPARTMENT_OTHER): Payer: Self-pay | Admitting: Cardiovascular Disease

## 2021-05-14 DIAGNOSIS — I701 Atherosclerosis of renal artery: Secondary | ICD-10-CM

## 2021-05-14 DIAGNOSIS — I5032 Chronic diastolic (congestive) heart failure: Secondary | ICD-10-CM | POA: Diagnosis not present

## 2021-05-14 DIAGNOSIS — E7800 Pure hypercholesterolemia, unspecified: Secondary | ICD-10-CM | POA: Diagnosis not present

## 2021-05-14 DIAGNOSIS — I160 Hypertensive urgency: Secondary | ICD-10-CM

## 2021-05-14 HISTORY — DX: Atherosclerosis of renal artery: I70.1

## 2021-05-14 MED ORDER — LABETALOL HCL 200 MG PO TABS: ORAL_TABLET | ORAL | 5 refills | Status: DC

## 2021-05-14 NOTE — Assessment & Plan Note (Signed)
She previously had mild blockage on the right renal artery.  Repeat renal Dopplers to make sure this is not progressed and cause worsened blood pressure control.  Continue rosuvastatin.

## 2021-05-14 NOTE — Assessment & Plan Note (Signed)
Ms. April [name] was seen in the ED with hypertensive urgency.  Her blood pressure has been consistently more elevated over the last week.  She has been under more stress caregiving for her husband.  This may be contributing.  She also has paroxysmal palpitations and very labile blood pressures as was seen in the office today.  She had catecholamines, metanephrines, renin, and aldosterone checked a couple years ago.  We will repeat the catecholamines and metanephrines.  We will also repeat her renal artery Dopplers given that she had mild to moderate blockage on the right when checked a couple years ago.  Increase labetalol to 400 mg 3 times daily.  Continue irbesartan and chlorthalidone.

## 2021-05-14 NOTE — Assessment & Plan Note (Signed)
She has grade 1 diastolic dysfunction and normal systolic function.  Working on blood pressure control as above.

## 2021-05-14 NOTE — Assessment & Plan Note (Signed)
LDL goal is less than 70.  Continue rosuvastatin.  She will come back for fasting lipids and a CMP.

## 2021-05-14 NOTE — Patient Instructions (Signed)
Medication Instructions:  INCREASE LABETALOL TO 200 MG 2 TABLETS (400 MG TOTAL) THREE TIMES A DAY   Labwork: FASTING LP/CMET/CATACHOLAMINES/METANEPHRINE WHEN YOU GET YOUR RENAL DUPLEX   Testing/Procedures: Your physician has requested that you have a renal artery duplex. During this test, an ultrasound is used to evaluate blood flow to the kidneys. Allow one hour for this exam. Do not eat after midnight the day before and avoid carbonated beverages. Take your medications as you usually do.  Follow-Up: 06/22/2021 8:00 AM WITH DR [facility]   Any Other Special Instructions Will Be Listed Below (If Applicable).     If you need a refill on your cardiac medications before your next appointment, please call your pharmacy.

## 2021-05-14 NOTE — Progress Notes (Signed)
Hypertension Clinic Initial Assessment:    Date:  05/19/2021   ID:  MASYN FULLAM, DOB 1955-08-03, MRN [id number]  PCP:  Hoy Register, MD  Cardiologist:  Parke Poisson, MD  Nephrologist:  Referring MD: Hoy Register, MD   CC: Hypertension  History of Present Illness:    April [name] is a 66 y.o. female with a hx of hypertension, chronic diastolic heart failure and obesity here to establish care in the hypertension clinic. She was first diagnosed approximately 10 years ago.  She is always struggled to control it.  She has had multiple hospitalizations for hypertensive urgency.  The most recent was 10/07/2019.  Hydralazine was increased and she was started on labetalol instead of metoprolol.  She had renal artery Dopplers that showed 1 to 59% stenosis on the right and normal on the left.  A.m. cortisol was within normal limits.  Renal and aldosterone levels were not consistent with hyperaldosteronism.  She also had a hospitalization 08/2019.  During that hospitalization her home hydrochlorothiazide and lisinopril were held due to a slight increase in her creatinine.  She saw Dr. Milas Gain on 6/24 and was started on irbesartan.  Since then her BP has still been high at home but labile.  She thinks that her blood pressure is related to stress.  It has been as high as the 170s over 60s.  She took her irbesartan and then rechecked it several hours later and there was no change.  Overall she follows a DASH diet and limits her sodium intake to 1500 mg daily.  She does not think she is particularly salt sensitive.  She has no caffeine or alcohol in her diet.  She snores but does not have apnea.  She does not feel rested in the morning, but she thinks this is because she does not sleep for many hours.  She has no daytime somnolence.  She has not been exercising lately because her blood pressure has not been controlled and she is afraid it will go very high.  She notes that overall her blood pressure  tends to be higher in the mornings and worse later in the day.  She struggles with palpitations and thinks this may also be stress related.  Her husband has coronary artery disease and has had prior heart attacks.  Her mother-in-law is in a long-term care facility and finding it.  Her mother and sister both had strokes with residual deficits.  She helps to try and take care of all of them.  At her first appointment hydralazine was stopped and she was started on irbestartan and HCTZ.  This was then switched to chlorthalidone and irbestartan was increased.  Her BP has ranged 120-140 and her heart rate has been 60-70s.  She saw Raquel on 10/7 and her BP was still elevated so HCTZ was switched to chlorthalidone.  She was referred to the PREP exercise program through the [facility]. She was seen in the ED 05/2021 with speech difficulties and elevated blood pressure.  MRI was negative for acute process.  Her symptoms improved with improvement of her blood pressure.  She had not taken her medications that day.  Her BP was elevated for a week.  Her BP was as high as 215.  She was given IV medication and instructed to follow up as an outpatient quickly.  She increased her medication to three times per day and BP remained elevated.  She has been under a lot of stress lately.  Her husband  is disabled and no longer works.  She currently has no chest pain or shortness of breath.  She has no LE edema, orthopnea or PND.  She notes that she does snore and has daytime somnolence.    Previous antihypertensives: Metoprolol Labetalol Clonidine Lisinopril/hctz amlodipine  Past Medical History:  Diagnosis Date   Anxiety    Atypical chest pain 03/28/2020   CKD (chronic kidney disease) stage 3, GFR 30-59 ml/min (HCC) 11/01/2019   Hypertension    Iron (Fe) deficiency anemia    Renal artery stenosis (HCC) 05/14/2021    Past Surgical History:  Procedure Laterality Date   CESAREAN SECTION      Current Medications: Current  Meds  Medication Sig   chlorthalidone (HYGROTON) 25 MG tablet Take 1 tablet (25 mg total) by mouth daily. TO REPLACE HYDROCHLOROTHIAZIDE   diclofenac Sodium (VOLTAREN) 1 % GEL Apply 4 g topically 4 (four) times daily as needed.   irbesartan (AVAPRO) 150 MG tablet Take 1 tablet by mouth twice daily   rosuvastatin (CRESTOR) 10 MG tablet Take 1 tablet (10 mg total) by mouth daily.   [DISCONTINUED] labetalol (NORMODYNE) 200 MG tablet Take 1 tablet by mouth twice daily     Allergies:   Amlodipine, Aspirin, Clonidine derivatives, Darvon [propoxyphene hcl], Lisinopril, Toprol xl [metoprolol], and Valium [diazepam]   Social History   Socioeconomic History   Marital status: Married    Spouse name: Not on file   Number of children: Not on file   Years of education: Not on file   Highest education level: Not on file  Occupational History   Occupation: retired  Tobacco Use   Smoking status: Never   Smokeless tobacco: Never  Substance and Sexual Activity   Alcohol use: Not Currently    Comment: rare   Drug use: Never   Sexual activity: Not on file  Other Topics Concern   Not on file  Social History Narrative   Not on file   Social Determinants of Health   Financial Resource Strain: Not on file  Food Insecurity: Not on file  Transportation Needs: Not on file  Physical Activity: Not on file  Stress: Not on file  Social Connections: Not on file     Family History: The patient's family history includes CVA in her mother and sister; Hypertension in her brother, maternal grandmother, mother, and sister. There is no history of Kidney failure.  ROS:   Please see the history of present illness.     All other systems reviewed and are negative.  EKGs/Labs/Other Studies Reviewed:    EKG:  EKG is not ordered today.  The ekg ordered 10/08/19 demonstrates sinus tachycardia.  Rate 122 bpm.    Recent Labs: 05/13/2021: ALT 15; BUN 18; Creatinine, Ser 1.00; Hemoglobin 14.6; Platelets 229;  Potassium 4.2; Sodium 139   Recent Lipid Panel    Component Value Date/Time   CHOL 158 12/10/2019 0905   TRIG 99 12/10/2019 0905   HDL 44 12/10/2019 0905   CHOLHDL 3.6 12/10/2019 0905   LDLCALC 96 12/10/2019 0905    Physical Exam:    VS:  BP 129/79 (BP Location: Left Arm, Patient Position: Sitting, Cuff Size: Large)    Pulse 75    Ht 5\' 8"  (1.727 m)    Wt 185 lb 14.4 oz (84.3 kg)    SpO2 98%    BMI 28.27 kg/m  , BMI Body mass index is 28.27 kg/m. GENERAL:  Well appearing HEENT: Pupils equal round and reactive, fundi not  visualized, oral mucosa unremarkable NECK:  No jugular venous distention, waveform within normal limits, carotid upstroke brisk and symmetric, no bruits LUNGS:  Clear to auscultation bilaterally HEART:  RRR.  PMI not displaced or sustained,S1 and S2 within normal limits, no S3, no S4, no clicks, no rubs, no murmurs ABD:  Flat, positive bowel sounds normal in frequency in pitch, no bruits, no rebound, no guarding, no midline pulsatile mass, no hepatomegaly, no splenomegaly EXT:  2 plus pulses throughout, no edema, no cyanosis no clubbing SKIN:  No rashes no nodules NEURO:  Cranial nerves II through XII grossly intact, motor grossly intact throughout PSYCH:  Cognitively intact, oriented to person place and time   ASSESSMENT:    1. Hypertensive urgency   2. Chronic diastolic CHF (congestive heart failure) (HCC)   3. Renal artery stenosis (HCC)   4. Pure hypercholesterolemia      PLAN:    # Essential hypertension:  Her blood pressure has been very elevated. It was better controlled and is worsening lately.  Stress may be contributing.  She also had RAS on the R in 2021.  We will repeat renal Dopplers.  Check for OSA with a sleep study given her snoring and daytime somnolence.  Work up for secondary causes is otherwise below.  Increase labetalol to 400mg  tid.  She was instructed not to take her ARB tid.  Continue irbesartan 150mg  bid and chlorthalidone. She will  work on increasing her exercise to 150 minutes weekly.  She will come check metanephrines and catecholamines given the   Secondary Causes of Hypertension  Medications/Herbal: OCP, steroids, stimulants, antidepressants, weight loss medication, immune suppressants, NSAIDs, sympathomimetics, alcohol, caffeine, licorice, ginseng, St. John's wort, chemo (none apply) Sleep Apnea (testing not indicated) Renal artery stenosis (1-59% on R 10/2019.  L normal) Hyperaldosteronism (negative 10/2019) Hyper/hypothyroidism (TSH normal 08/2019) Pheochromocytoma: (negative 10/2019) Cushing's syndrome: cortisol 7.9 Coarctation of the aorta (symmetric BP)  # Atypical chest pain: # Hyperlipidemia: She is tolerating rosuvastatin.  She will return for fasting lipids and CMP.  Chest pain only in the setting of hypertensive urgency. She has no exertional symptoms.   Disposition:    FU with MD/PharmD in 1 months   Medication Adjustments/Labs and Tests Ordered: Current medicines are reviewed at length with the patient today.  Concerns regarding medicines are outlined above.  Orders Placed This Encounter  Procedures   Lipid panel   Catecholamines, fractionated, plasma   Metanephrines, plasma   Comprehensive metabolic panel   VAS 10/2019 RENAL ARTERY DUPLEX   Meds ordered this encounter  Medications   labetalol (NORMODYNE) 200 MG tablet    Sig: TAKE 2 TABLETS BY MOUTH THREE TIMES A DAY    Dispense:  180 tablet    Refill:  5    NEW DOSE, D/C PREVIOUS RX     Signed, 10/2019, MD  05/19/2021 7:59 AM    [facility]

## 2021-05-19 ENCOUNTER — Encounter (HOSPITAL_BASED_OUTPATIENT_CLINIC_OR_DEPARTMENT_OTHER): Payer: Self-pay | Admitting: Cardiovascular Disease

## 2021-05-20 ENCOUNTER — Other Ambulatory Visit: Payer: Self-pay

## 2021-05-20 ENCOUNTER — Ambulatory Visit (INDEPENDENT_AMBULATORY_CARE_PROVIDER_SITE_OTHER): Payer: Medicare Other

## 2021-05-20 DIAGNOSIS — I701 Atherosclerosis of renal artery: Secondary | ICD-10-CM | POA: Diagnosis not present

## 2021-05-20 DIAGNOSIS — I160 Hypertensive urgency: Secondary | ICD-10-CM

## 2021-05-24 LAB — COMPREHENSIVE METABOLIC PANEL
ALT: 13 IU/L (ref 0–32)
AST: 21 IU/L (ref 0–40)
Albumin/Globulin Ratio: 1.8 (ref 1.2–2.2)
Albumin: 4.6 g/dL (ref 3.8–4.8)
Alkaline Phosphatase: 133 IU/L — ABNORMAL HIGH (ref 44–121)
BUN/Creatinine Ratio: 22 (ref 12–28)
BUN: 22 mg/dL (ref 8–27)
Bilirubin Total: 0.3 mg/dL (ref 0.0–1.2)
CO2: 22 mmol/L (ref 20–29)
Calcium: 9.9 mg/dL (ref 8.7–10.3)
Chloride: 106 mmol/L (ref 96–106)
Creatinine, Ser: 1 mg/dL (ref 0.57–1.00)
Globulin, Total: 2.6 g/dL (ref 1.5–4.5)
Glucose: 90 mg/dL (ref 70–99)
Potassium: 4.7 mmol/L (ref 3.5–5.2)
Sodium: 141 mmol/L (ref 134–144)
Total Protein: 7.2 g/dL (ref 6.0–8.5)
eGFR: 62 mL/min/{1.73_m2} (ref >59)

## 2021-05-24 LAB — METANEPHRINES, PLASMA
Metanephrine, Free: 43.6 pg/mL (ref 0.0–88.0)
Normetanephrine, Free: 127.8 pg/mL (ref 0.0–285.2)

## 2021-05-24 LAB — LIPID PANEL
Chol/HDL Ratio: 3.5 ratio (ref 0.0–4.4)
Cholesterol, Total: 159 mg/dL (ref 100–199)
HDL: 45 mg/dL (ref >39)
LDL Chol Calc (NIH): 96 mg/dL (ref 0–99)
Triglycerides: 95 mg/dL (ref 0–149)
VLDL Cholesterol Cal: 18 mg/dL (ref 5–40)

## 2021-05-24 LAB — CATECHOLAMINES, FRACTIONATED, PLASMA
Dopamine: <30 pg/mL (ref 0–48)
Epinephrine: 51 pg/mL (ref 0–62)
Norepinephrine: 488 pg/mL (ref 0–874)

## 2021-06-21 NOTE — Progress Notes (Signed)
Advanced Hypertension Clinic Follow-up:    Date:  06/22/2021   ID:  April [name], DOB [birth year], MRN [id number]  PCP:  Charlott Rakes, MD  Cardiologist:  Elouise Munroe, MD  Nephrologist:  Referring MD: Charlott Rakes, MD   CC: Hypertension  History of Present Illness:    April [name] is a 66 y.o. female with a hx of renal artery stenosis, hypertension, chronic diastolic heart failure and obesity here for follow-up. She was initially seen in the hypertension clinic on 10/31/19. She was first diagnosed approximately 10 years ago.  She has always struggled to control it.  She has had multiple hospitalizations for hypertensive urgency.  The most recent was 10/07/2019.  Hydralazine was increased and she was started on labetalol instead of metoprolol.  She had renal artery Dopplers that showed 1 to 59% stenosis on the right and normal on the left.  A.m. cortisol was within normal limits.  Renal and aldosterone levels were not consistent with hyperaldosteronism.  She also had a hospitalization 08/2019.  During that hospitalization her home hydrochlorothiazide and lisinopril were held due to a slight increase in her creatinine.  She saw Dr. Monte Fantasia on 6/24 and was started on irbesartan.  She noted that her blood pressure changed a lot with stress.   At her first appointment hydralazine was stopped and she was started on irbestartan and HCTZ.  This was then switched to chlorthalidone and irbestartan was increased.  Her BP ranged 120-140 and her heart rate had been 60-70s.  She saw Raquel on 10/7 and her BP was still elevated so HCTZ was switched to chlorthalidone.  She was referred to the PREP exercise program through the [facility]. She was seen in the ED 05/2021 with speech difficulties and elevated blood pressure.  MRI was negative for acute process.  Her symptoms improved with improvement of her blood pressure.  She had not taken her medications that day.  Her BP was elevated for a week, as high as  215.  She was given IV medication and instructed to follow up as an outpatient quickly.  She increased her medication to three times per day and BP remained elevated. We repeated her renal dopplers which showed 1-59% stenosis bilaterally. Labetalol was increased to 400 mg TID. Catecholamines and metanephrines were normal.  Today, she reports having more good than bad days. At home, her blood pressure has been spiking with breakthrough episodes that cause her to consider calling EMS. Usually she is able to tell when this is occurring, often with increased palpitations. She will try to relax for the rest of the day. Since last month this has occurred about 4 times. On average her blood pressure is in the 150s-160s at home, and it rises to 185 or greater during her breakthrough episodes. One of her high blood pressure episodes occurred the morning after "intensely" re-organizing her closet. She believes it's possible her episodes correlate with over-exertion, but she is not certain. Regarding her hypertension, she does not believe she is salt sensitive. She notes some confusion with her Labetalol, as her most recent bottle had instructions for taking 1 tablet 3 times a day. Previously, she does not recall ever having an adverse reaction to spironolactone. Generally she is also feeling fatigued, which she attributes to her heart failure. She reports having a lot of heart palpitations as well. On some days, she also notices a lot of swelling in her legs. For activity, she has not been able to exercise consistently. She  is interested in a referral to a dietician or nutritionist. She denies any chest pain, or shortness of breath. No lightheadedness, headaches, syncope, orthopnea, or PND.  Previous antihypertensives: Metoprolol Labetalol  Clonidine Lisinopril/hctz amlodipine  Past Medical History:  Diagnosis Date   Anxiety    Atypical chest pain 03/28/2020   CKD (chronic kidney disease) stage 3, GFR 30-59  ml/min (HCC) 11/01/2019   Hypertension    Iron (Fe) deficiency anemia    Overweight 06/22/2021   Renal artery stenosis ([place]) 05/14/2021    Past Surgical History:  Procedure Laterality Date   CESAREAN SECTION      Current Medications: Current Meds  Medication Sig   chlorthalidone (HYGROTON) 25 MG tablet Take 1 tablet (25 mg total) by mouth daily. TO REPLACE HYDROCHLOROTHIAZIDE   diclofenac Sodium (VOLTAREN) 1 % GEL Apply 4 g topically 4 (four) times daily as needed.   irbesartan (AVAPRO) 150 MG tablet Take 1 tablet by mouth twice daily   labetalol (NORMODYNE) 200 MG tablet TAKE 2 TABLETS BY MOUTH THREE TIMES A DAY   rosuvastatin (CRESTOR) 10 MG tablet Take 1 tablet (10 mg total) by mouth daily.   spironolactone (ALDACTONE) 25 MG tablet Take 1 tablet (25 mg total) by mouth daily.     Allergies:   Amlodipine, Aspirin, Clonidine derivatives, Darvon [propoxyphene hcl], Lisinopril, Toprol xl [metoprolol], and Valium [diazepam]   Social History   Socioeconomic History   Marital status: Married    Spouse name: Not on file   Number of children: Not on file   Years of education: Not on file   Highest education level: Not on file  Occupational History   Occupation: retired  Tobacco Use   Smoking status: Never   Smokeless tobacco: Never  Substance and Sexual Activity   Alcohol use: Not Currently    Comment: rare   Drug use: Never   Sexual activity: Not on file  Other Topics Concern   Not on file  Social History Narrative   Not on file   Social Determinants of Health   Financial Resource Strain: Not on file  Food Insecurity: Not on file  Transportation Needs: Not on file  Physical Activity: Not on file  Stress: Not on file  Social Connections: Not on file     Family History: The patient's family history includes CVA in her mother and sister; Hypertension in her brother, maternal grandmother, mother, and sister. There is no history of Kidney failure.  ROS:   Please see the  history of present illness.    (+) Palpitations (+) Fatigue (+) Bilateral LE edema All other systems reviewed and are negative.  EKGs/Labs/Other Studies Reviewed:    Bilateral Renal Artery Doppler 05/20/2021: Summary:  Largest Aortic Diameter: 2.0 cm     Renal:     Right: Normal size right kidney. Abnormal right Resistive Index.         Normal cortical thickness of right kidney. RRV flow present.         1-59% stenosis (low end range) of the right renal artery.  Left:  Normal size of left kidney. Abnormal left Resisitve Index.         Normal cortical thickness of the left kidney. LRV flow         present. 1-59% (low end range) stenosis of the left renal         artery.  Mesenteric:  Normal Celiac artery and Superior Mesenteric artery findings.   Echo 09/29/2019:  1. Hyperdynamic LV systolic function;  grade 1 diastolic dysfunction.   2. Left ventricular ejection fraction, by estimation, is 70 to 75%. The  left ventricle has hyperdynamic function. The left ventricle has no  regional wall motion abnormalities. Left ventricular diastolic parameters  are consistent with Grade I diastolic  dysfunction (impaired relaxation).   3. Right ventricular systolic function is normal. The right ventricular  size is normal. There is normal pulmonary artery systolic pressure.   4. The mitral valve is normal in structure. Trivial mitral valve  regurgitation. No evidence of mitral stenosis.   5. The aortic valve is tricuspid. Aortic valve regurgitation is not  visualized. No aortic stenosis is present.   6. The inferior vena cava is normal in size with greater than 50%  respiratory variability, suggesting right atrial pressure of 3 mmHg.  EKG:  EKG is personally reviewed. 06/22/2021: EKG was not ordered. 10/08/19: Sinus tachycardia.  Rate 122 bpm.    Recent Labs: 05/13/2021: Hemoglobin 14.6; Platelets 229 05/20/2021: ALT 13; BUN 22; Creatinine, Ser 1.00; Potassium 4.7; Sodium 141   Recent Lipid  Panel    Component Value Date/Time   CHOL 159 05/20/2021 0800   TRIG 95 05/20/2021 0800   HDL 45 05/20/2021 0800   CHOLHDL 3.5 05/20/2021 0800   LDLCALC 96 05/20/2021 0800    Physical Exam:    VS:  BP (!) 196/98 (BP Location: Left Arm, Patient Position: Sitting, Cuff Size: Large)    Pulse 89    Ht 5\' 8"  (1.727 m)    Wt 192 lb 4.8 oz (87.2 kg)    SpO2 96%    BMI 29.24 kg/m  , BMI Body mass index is 29.24 kg/m. GENERAL:  Well appearing HEENT: Pupils equal round and reactive, fundi not visualized, oral mucosa unremarkable NECK:  No jugular venous distention, waveform within normal limits, carotid upstroke brisk and symmetric, no bruits LUNGS:  Clear to auscultation bilaterally HEART:  RRR.  PMI not displaced or sustained,S1 and S2 within normal limits, no S3, no S4, no clicks, no rubs, no murmurs ABD:  Flat, positive bowel sounds normal in frequency in pitch, no bruits, no rebound, no guarding, no midline pulsatile mass, no hepatomegaly, no splenomegaly EXT:  2 plus pulses throughout, no edema, no cyanosis no clubbing SKIN:  No rashes no nodules NEURO:  Cranial nerves II through XII grossly intact, motor grossly intact throughout PSYCH:  Cognitively intact, oriented to person place and time   ASSESSMENT:    1. Therapeutic drug monitoring   2. Hypertension, unspecified type   3. Renal artery stenosis ([place])   4. Pure hypercholesterolemia   5. Overweight     PLAN:    Hypertension Blood pressure continues to be very elevated at times.  She has struggled with exercise and nutrition.  She requests a nutrition referral.  Continue chlorthalidone, irbesartan, and increase labetalol to 400 mg 3 times daily.  We will also add spironolactone 25 mg daily.  Check a basic metabolic panel in a week.  At the end of the visit she noted that she was feeling dizzy.  She was given hydralazine 50mg  in the office.  Renal artery stenosis (HCC) 1 to 59% bilateral renal artery stenosis.  This is not  contributing to her poorly controlled hypertension.  Hyperlipidemia Continue rosuvastatin.   She is going to work on increasing her exercise.  We will also refer her to a nutritionist.   Overweight Referral to nutritionist.  Recommend at least 150 min of exercise weekly.  Disposition:  FU with Chancelor Hardrick C. Oval Linsey, MD, [facility] in 1 month.  Medication Adjustments/Labs and Tests Ordered: Current medicines are reviewed at length with the patient today.  Concerns regarding medicines are outlined above.   Orders Placed This Encounter  Procedures   Basic metabolic panel   Ambulatory referral to Nutrition and Diabetic Education   Meds ordered this encounter  Medications   spironolactone (ALDACTONE) 25 MG tablet    Sig: Take 1 tablet (25 mg total) by mouth daily.    Dispense:  90 tablet    Refill:  1   I,Mathew Stumpf,acting as a scribe for Skeet Latch, MD.,have documented all relevant documentation on the behalf of Skeet Latch, MD,as directed by  Skeet Latch, MD while in the presence of Skeet Latch, MD.  I, [place] Oval Linsey, MD have reviewed all documentation for this visit.  The documentation of the exam, diagnosis, procedures, and orders on 06/22/2021 are all accurate and complete.   Signed, Skeet Latch, MD  06/22/2021 9:25 AM    [place]

## 2021-06-22 ENCOUNTER — Other Ambulatory Visit: Payer: Self-pay

## 2021-06-22 ENCOUNTER — Emergency Department (HOSPITAL_COMMUNITY): Payer: Medicare Other

## 2021-06-22 ENCOUNTER — Encounter (HOSPITAL_BASED_OUTPATIENT_CLINIC_OR_DEPARTMENT_OTHER): Payer: Self-pay | Admitting: Cardiovascular Disease

## 2021-06-22 ENCOUNTER — Ambulatory Visit (INDEPENDENT_AMBULATORY_CARE_PROVIDER_SITE_OTHER): Payer: Medicare Other | Admitting: Cardiovascular Disease

## 2021-06-22 ENCOUNTER — Emergency Department (HOSPITAL_COMMUNITY)
Admission: EM | Admit: 2021-06-22 | Discharge: 2021-06-22 | Disposition: A | Discharge status: Home / Self Care | Payer: Medicare Other | Attending: Emergency Medicine | Admitting: Emergency Medicine

## 2021-06-22 VITALS — BP 196/98 | HR 89 | Ht 68.0 in | Wt 192.3 lb

## 2021-06-22 DIAGNOSIS — Z79899 Other long term (current) drug therapy: Secondary | ICD-10-CM | POA: Diagnosis not present

## 2021-06-22 DIAGNOSIS — R202 Paresthesia of skin: Secondary | ICD-10-CM | POA: Insufficient documentation

## 2021-06-22 DIAGNOSIS — Z5181 Encounter for therapeutic drug level monitoring: Secondary | ICD-10-CM

## 2021-06-22 DIAGNOSIS — I10 Essential (primary) hypertension: Secondary | ICD-10-CM | POA: Diagnosis not present

## 2021-06-22 DIAGNOSIS — E7800 Pure hypercholesterolemia, unspecified: Secondary | ICD-10-CM | POA: Diagnosis not present

## 2021-06-22 DIAGNOSIS — E663 Overweight: Secondary | ICD-10-CM

## 2021-06-22 DIAGNOSIS — I701 Atherosclerosis of renal artery: Secondary | ICD-10-CM | POA: Diagnosis not present

## 2021-06-22 HISTORY — DX: Overweight: E66.3

## 2021-06-22 LAB — CBC WITH DIFFERENTIAL/PLATELET
Abs Immature Granulocytes: 0.07 10*3/uL (ref 0.00–0.07)
Basophils Absolute: 0 10*3/uL (ref 0.0–0.1)
Basophils Relative: 0 %
Eosinophils Absolute: 0.2 10*3/uL (ref 0.0–0.5)
Eosinophils Relative: 3 %
HCT: 39.6 % (ref 36.0–46.0)
Hemoglobin: 13.5 g/dL (ref 12.0–15.0)
Immature Granulocytes: 1 %
Lymphocytes Relative: 19 %
Lymphs Abs: 1.4 10*3/uL (ref 0.7–4.0)
MCH: 29.8 pg (ref 26.0–34.0)
MCHC: 34.1 g/dL (ref 30.0–36.0)
MCV: 87.4 fL (ref 80.0–100.0)
Monocytes Absolute: 0.5 10*3/uL (ref 0.1–1.0)
Monocytes Relative: 6 %
Neutro Abs: 5.1 10*3/uL (ref 1.7–7.7)
Neutrophils Relative %: 71 %
Platelets: 247 10*3/uL (ref 150–400)
RBC: 4.53 MIL/uL (ref 3.87–5.11)
RDW: 12.2 % (ref 11.5–15.5)
WBC: 7.2 10*3/uL (ref 4.0–10.5)
nRBC: 0 % (ref 0.0–0.2)

## 2021-06-22 LAB — BASIC METABOLIC PANEL
Anion gap: 10 (ref 5–15)
BUN: 10 mg/dL (ref 8–23)
CO2: 25 mmol/L (ref 22–32)
Calcium: 9.3 mg/dL (ref 8.9–10.3)
Chloride: 106 mmol/L (ref 98–111)
Creatinine, Ser: 0.99 mg/dL (ref 0.44–1.00)
GFR, Estimated: >60 mL/min (ref >60)
Glucose, Bld: 115 mg/dL — ABNORMAL HIGH (ref 70–99)
Potassium: 3.7 mmol/L (ref 3.5–5.1)
Sodium: 141 mmol/L (ref 135–145)

## 2021-06-22 MED ORDER — SPIRONOLACTONE 25 MG PO TABS: 25.0000 mg | ORAL_TABLET | Freq: Every day | ORAL | 1 refills | Status: DC

## 2021-06-22 MED ORDER — HYDRALAZINE HCL 25 MG PO TABS
25.0000 mg | ORAL_TABLET | Freq: Once | ORAL | Status: AC
  Administered 2021-06-22: 25 mg via ORAL

## 2021-06-22 NOTE — Addendum Note (Signed)
Addended by: Earvin Hansen on: 06/22/2021 09:58 AM   Modules accepted: Orders

## 2021-06-22 NOTE — Assessment & Plan Note (Addendum)
Blood pressure continues to be very elevated at times.  She has struggled with exercise and nutrition.  She requests a nutrition referral.  Continue chlorthalidone, irbesartan, and increase labetalol to 400 mg 3 times daily.  We will also add spironolactone 25 mg daily.  Check a basic metabolic panel in a week.  At the end of the visit she noted that she was feeling dizzy.  She was given hydralazine 50mg  in the office.

## 2021-06-22 NOTE — ED Triage Notes (Signed)
Pt BIB [facility] complaint of left jaw numbness/chin numbness upon leaving appointment with cardiologist where her medications were changed due to hypertension.

## 2021-06-22 NOTE — Assessment & Plan Note (Signed)
1 to 59% bilateral renal artery stenosis.  This is not contributing to her poorly controlled hypertension.

## 2021-06-22 NOTE — Patient Instructions (Addendum)
Medication Instructions:  START SPIRONOLACTONE 25 MG DAILY   TAKE LABETALOL 2 THREE TIMES A DAY   Labwork: BMET IN 1 WEEK   Testing/Procedures: NONE   Follow-Up: 07/21/2021 AT 9:00 AM   Any Other Special Instructions Will Be Listed Below (If Applicable).  WILL HAVE AMY OUR CARE GUIDE REACH OUT TO YOU FOR HEALTH COACHING

## 2021-06-22 NOTE — ED Provider Notes (Signed)
[facility] EMERGENCY DEPARTMENT Provider Note   CSN: [id number] Arrival date & time: 06/22/21  1230     History  Chief Complaint  Patient presents with   Hypertension   Numbness    MEHA VIDRINE is a 66 y.o. female.  Patient arrives to the ED with tingling/numbness to the left side of her chin.  She was just at the cardiologist office for follow-up for her blood pressure.  Her blood pressure was in the 200s and they gave her hydralazine.  Her blood pressure improved and she was sent home.  When she got home she developed some numbness to the left side of her chin.  Seems to be improving.  Denies any other weakness or numbness.  Denies any vision changes, speech changes.  Denies any chest pain or shortness of breath.  Patient has a history of high blood pressure.  She also has a history of anxiety, CKD.  The history is provided by the patient.  Neurologic Problem This is a new problem. The current episode started less than 1 hour ago. The problem occurs constantly. The problem has not changed since onset.Pertinent negatives include no chest pain, no abdominal pain, no headaches and no shortness of breath. Nothing aggravates the symptoms. Nothing relieves the symptoms. She has tried nothing for the symptoms. The treatment provided no relief.      Home Medications Prior to Admission medications   Medication Sig Start Date End Date Taking? Authorizing Provider  chlorthalidone (HYGROTON) 25 MG tablet Take 1 tablet (25 mg total) by mouth daily. TO REPLACE HYDROCHLOROTHIAZIDE 02/06/20   Chilton Si, MD  diclofenac Sodium (VOLTAREN) 1 % GEL Apply 4 g topically 4 (four) times daily as needed. 03/25/20   Hilts, Casimiro Needle, MD  irbesartan (AVAPRO) 150 MG tablet Take 1 tablet by mouth twice daily 10/12/20   Chilton Si, MD  labetalol (NORMODYNE) 200 MG tablet TAKE 2 TABLETS BY MOUTH THREE TIMES A DAY 05/14/21   Chilton Si, MD  rosuvastatin (CRESTOR) 10 MG tablet  Take 1 tablet (10 mg total) by mouth daily. 05/11/20 06/22/21  Chilton Si, MD  spironolactone (ALDACTONE) 25 MG tablet Take 1 tablet (25 mg total) by mouth daily. 06/22/21   Chilton Si, MD      Allergies    Amlodipine, Aspirin, Clonidine derivatives, Darvon [propoxyphene hcl], Lisinopril, Toprol xl [metoprolol], and Valium [diazepam]    Review of Systems   Review of Systems  Respiratory:  Negative for shortness of breath.   Cardiovascular:  Negative for chest pain.  Gastrointestinal:  Negative for abdominal pain.  Neurological:  Negative for headaches.   Physical Exam Updated Vital Signs BP (!) 142/67    Pulse 81    Temp 97.9 F (36.6 C) (Oral)    Resp 15    Ht 5\' 5"  (1.651 m)    Wt 83.9 kg    SpO2 98%    BMI 30.79 kg/m  Physical Exam Vitals and nursing note reviewed.  Constitutional:      General: She is not in acute distress.    Appearance: She is well-developed. She is not ill-appearing.  HENT:     Head: Normocephalic and atraumatic.     Nose: Nose normal.     Mouth/Throat:     Mouth: Mucous membranes are moist.  Eyes:     Extraocular Movements: Extraocular movements intact.     Conjunctiva/sclera: Conjunctivae normal.     Pupils: Pupils are equal, round, and reactive to light.  Cardiovascular:  Rate and Rhythm: Normal rate and regular rhythm.     Pulses: Normal pulses.     Heart sounds: Normal heart sounds. No murmur heard. Pulmonary:     Effort: Pulmonary effort is normal. No respiratory distress.     Breath sounds: Normal breath sounds.  Abdominal:     Palpations: Abdomen is soft.     Tenderness: There is no abdominal tenderness.  Musculoskeletal:        General: No swelling or tenderness. Normal range of motion.     Cervical back: Normal range of motion and neck supple.  Skin:    General: Skin is warm and dry.     Capillary Refill: Capillary refill takes less than 2 seconds.  Neurological:     General: No focal deficit present.     Mental  Status: She is alert and oriented to person, place, and time.     Cranial Nerves: No cranial nerve deficit.     Sensory: Sensory deficit present.     Motor: No weakness.     Coordination: Coordination normal.     Comments: 5+ out of 5 strength throughout, sensation appears to be intact except for some subjective numbness to the left portion of her chin, she has normal speech, normal visual acuity, no visual field deficits, normal finger-nose-finger, no drift  Psychiatric:        Mood and Affect: Mood normal.    ED Results / Procedures / Treatments   Labs (all labs ordered are listed, but only abnormal results are displayed) Labs Reviewed  BASIC METABOLIC PANEL - Abnormal; Notable for the following components:      Result Value   Glucose, Bld 115 (*)    All other components within normal limits  CBC WITH DIFFERENTIAL/PLATELET    EKG EKG Interpretation  Date/Time:  Tuesday June 22 2021 12:35:50 EST Ventricular Rate:  86 PR Interval:  151 QRS Duration: 86 QT Interval:  353 QTC Calculation: 423 R Axis:   51 Text Interpretation: Sinus rhythm Biatrial enlargement Confirmed by Virgina Norfolk ([id number]) on 06/22/2021 2:27:53 PM  Radiology CT HEAD WO CONTRAST ( )  Result Date: 06/22/2021 CLINICAL DATA:  Left facial numbness EXAM: CT HEAD WITHOUT CONTRAST TECHNIQUE: Contiguous axial images were obtained from the base of the skull through the vertex without intravenous contrast. RADIATION DOSE REDUCTION: This exam was performed according to the departmental dose-optimization program which includes automated exposure control, adjustment of the mA and/or kV according to patient size and/or use of iterative reconstruction technique. COMPARISON:  CT head and brain MRI 05/13/2021 FINDINGS: Brain: There is no evidence of acute intracranial hemorrhage, extra-axial fluid collection, or acute infarct. Parenchymal volume is normal. The ventricles are normal in size. The parenchyma is normal in  appearance. There is no mass lesion.  There is no midline shift. Vascular: There is calcification of the bilateral cavernous ICAs. Skull: Normal. Negative for fracture or focal lesion. Sinuses/Orbits: The imaged paranasal sinuses are clear. The globes and orbits are unremarkable. Other: None. IMPRESSION: No acute intracranial pathology. Electronically Signed   By: Lesia Hausen M.D.   On: 06/22/2021 13:37   MR BRAIN WO CONTRAST  Result Date: 06/22/2021 CLINICAL DATA:  Acute neuro deficit rule out stroke EXAM: MRI HEAD WITHOUT CONTRAST TECHNIQUE: Multiplanar, multiecho pulse sequences of the brain and surrounding structures were obtained without intravenous contrast. COMPARISON:  CT head 06/22/2021.  MRI head 05/13/2021 FINDINGS: Brain: No acute infarction, hemorrhage, hydrocephalus, extra-axial collection or mass lesion. Mild white matter changes most consistent  with chronic microvascular ischemia. Vascular: Normal arterial flow voids. Skull and upper cervical spine: No focal skeletal lesion. Sinuses/Orbits: Mild mucosal edema paranasal sinuses. Negative orbit Other: None IMPRESSION: Negative for acute infarct. Mild chronic microvascular ischemic change in the white matter. Electronically Signed   By: Marlan Palau M.D.   On: 06/22/2021 14:51    Procedures Procedures    Medications Ordered in ED Medications - No data to display  ED Course/ Medical Decision Making/ A&P                           Medical Decision Making Amount and/or Complexity of Data Reviewed Labs: ordered. Radiology: ordered.   RAINY ROTHMAN is a 66 year old female with history of high blood pressure, anxiety, CKD presents to the ED with left-sided chin numbness.  Unremarkable vitals.  No fever.  Patient states that about an hour ago after getting home from the cardiologist office she developed some tingling to the left side of her chin.  She was given hydralazine while she was in the cardiologist office as her blood pressure  was over 200s.  She arrives with blood pressure of 135/87 here.  She is neurologically intact except for some subjective numbness to the chin area.  She has normal strength and sensation.  Normal finger-nose-finger.  Normal speech.  She overall appears very well.  I talked with Dr. Otelia Limes on the phone with neurology and given that she has very minimal subjective symptoms we will just go ahead and get an MRI of her brain and not activate a code stroke.  Basic labs were also obtained including CBC and BMP.  EKG per my review and interpretation shows sinus rhythm.  No ischemic changes.  Overall these are fairly subjective changes.  We will rule out stroke.  This could be more of a paresthesia/anxiety type event.  Per review of labs my interpretation shows that there is no significant anemia or electrolyte abnormality.  No kidney injury.  Head CT was ordered and per my review shows no head bleed.  MRI was ordered and per radiology report there is no acute stroke.  Overall patient was given reassurance.  Blood pressure has been stable.  Discharged in good condition.  Understands return precautions.  This chart was dictated using voice recognition software.  Despite best efforts to proofread,  errors can occur which can change the documentation meaning.         Final Clinical Impression(s) / ED Diagnoses Final diagnoses:  Paresthesia    Rx / DC Orders ED Discharge Orders     None         Virgina Norfolk, DO 06/22/21 1500

## 2021-06-22 NOTE — ED Notes (Signed)
Pt verbalizes understanding of discharge instructions. Opportunity for questions and answers were provided. Pt discharged from the ED.   ?

## 2021-06-22 NOTE — Assessment & Plan Note (Signed)
Continue rosuvastatin.   She is going to work on increasing her exercise.  We will also refer her to a nutritionist.

## 2021-06-22 NOTE — ED Notes (Signed)
Patient transported to MRI 

## 2021-06-22 NOTE — Assessment & Plan Note (Signed)
Referral to nutritionist.  Recommend at least 150 min of exercise weekly.

## 2021-06-23 ENCOUNTER — Telehealth: Payer: Self-pay

## 2021-06-23 DIAGNOSIS — Z0000 Encounter for general adult medical examination without abnormal findings: Secondary | ICD-10-CM

## 2021-06-23 NOTE — Telephone Encounter (Signed)
Called patient to offer health coaching per referral from Dr. [name]. Patient did not answer. Left message for patient to return call to discuss health coaching more in detail.    Stasha Naraine Nedra Hai, [facility] Guide, Health Coach [address] [place] [address] Telephone: [phone] Email: [email or web address]

## 2021-07-03 LAB — BASIC METABOLIC PANEL
BUN/Creatinine Ratio: 11 — ABNORMAL LOW (ref 12–28)
BUN: 12 mg/dL (ref 8–27)
CO2: 23 mmol/L (ref 20–29)
Calcium: 9.9 mg/dL (ref 8.7–10.3)
Chloride: 109 mmol/L — ABNORMAL HIGH (ref 96–106)
Creatinine, Ser: 1.08 mg/dL — ABNORMAL HIGH (ref 0.57–1.00)
Glucose: 94 mg/dL (ref 70–99)
Potassium: 4.8 mmol/L (ref 3.5–5.2)
Sodium: 146 mmol/L — ABNORMAL HIGH (ref 134–144)
eGFR: 57 mL/min/{1.73_m2} — ABNORMAL LOW (ref >59)

## 2021-07-21 ENCOUNTER — Ambulatory Visit: Payer: Medicare Other

## 2021-07-22 ENCOUNTER — Other Ambulatory Visit: Payer: Self-pay

## 2021-07-22 ENCOUNTER — Ambulatory Visit (INDEPENDENT_AMBULATORY_CARE_PROVIDER_SITE_OTHER): Payer: Medicare Other | Admitting: Pharmacist Clinician (PhC)/ Clinical Pharmacy Specialist

## 2021-07-22 DIAGNOSIS — I10 Essential (primary) hypertension: Secondary | ICD-10-CM | POA: Diagnosis not present

## 2021-07-22 MED ORDER — HYDRALAZINE HCL 25 MG PO TABS
25.0000 mg | ORAL_TABLET | Freq: Three times a day (TID) | ORAL | 3 refills | Status: DC
Start: 2021-07-22 — End: 2022-09-12

## 2021-07-22 NOTE — Patient Instructions (Signed)
Return for a a follow up appointment April 17 at 9 am ? ?Check your blood pressure at home twice daily and keep record of the readings. ? ?Take your BP meds as follows: ? Start hydralazine 25 mg three times daily (with labetalol doses) ? Continue with all other medications ? ?Bring all of your meds, your BP cuff and your record of home blood pressures to your next appointment.  Exercise as you?re able, try to walk approximately 30 minutes per day.  Keep salt intake to a minimum, especially watch canned and prepared boxed foods.  Eat more fresh fruits and vegetables and fewer canned items.  Avoid eating in fast food restaurants.  ? ? HOW TO TAKE YOUR BLOOD PRESSURE: ?Rest 5 minutes before taking your blood pressure. ? Don?t smoke or drink caffeinated beverages for at least 30 minutes before. ?Take your blood pressure before (not after) you eat. ?Sit comfortably with your back supported and both feet on the floor (don?t cross your legs). ?Elevate your arm to heart level on a table or a desk. ?Use the proper sized cuff. It should fit smoothly and snugly around your bare upper arm. There should be enough room to slip a fingertip under the cuff. The bottom edge of the cuff should be 1 inch above the crease of the elbow. ?Ideally, take 3 measurements at one sitting and record the average. ? ? ?

## 2021-07-22 NOTE — Progress Notes (Signed)
? ? ? ?07/22/2021 ?CHERINE DRUMGOOLE ?1955-11-21 ?[id number] ? ? ?HPI:  April [name] is a 66 y.o. female patient of Dr [name], with a PMH below who presents today for advanced hypertension clinic follow up.  She was referred to Advanced Hypertension clinic by Dr. [name].  Patient has been seen in [facility] multiple times and continues to struggle with BP control.  At the time of that visit it had been 18 months since her last ED visit for hypertensive urgency/emergency.  At this most recent visit (Feb 23) she was noting more good days than bad, although she was still having some BP spikes - about 4 times in the past month.  In these episodes her pressure rises 25+ points.  She cannot pinpoint any specific activity that causes these.  Her medications have changed multiple times over the past few years.   Dr. [name] continued her chlorthalidone and irbesartan, increased labetalol to 400 mg tid and added spironolactone 25 mg.  She was asked to get metabolic panel in 1 week - this showed a slight bump in SCr and potassium, neither of which was significant.   ? ?Today she returns for follow up.  Notes that on the day of her last visit, she went home and soon developed facial numbness.  She called EMS and was taken to ED.  Neuro was consulted, but stroke code was not called.  CT and MRI were both completed, neither showing anything of concern.  Her pressure came down without incident and she was released.  She has no home BP readings with her today, but recites a few from memory.  Notes that one reading was good at 130/84 and felt weak/dizzy.  She is assuming that her body is not used to such low readings.  She has 3 cuffs at home, one is "medical grade", one belongs to her husband and one is a newer (18 months) [employer] device.   ? ? ?Past Medical History: ?CHF Chronic diastolic  ?hyperlipidemia 1/23:  LDL 96 - not on meds  ?RAS 1-59% blockage in both arteries  ?  ? ?Blood Pressure Goal:  130/80 ? ?Current Medications:  spironolactone 25 mg qd - am, labetalol 400 mg tid (7-8 am, 2 pm, 8 pm), irbesartan 150 mg bid (7-8 am, 8 pm), chlorthalidone 25 mg qd (2 pm) ? ?Family Hx: mother had stroke at 69 paralyzed 1 side, still living;  sister with 4+ strokes, first around 55,now  cognitively impaired; brother with hypertension, CABG, died at 75; father with CHF died at 51; 2 children both healthy (37, 66 - sons) ? ?Social Hx: no tobacco, no alcohol, caffeine free herbal tea, caffeine free soda ? ?Diet: mix of eating out, vs home; some fast food; protein at most all meals; vegetables are fresh or frozen, likes smoothies or salads.  Snacks on nuts and fruit; works with dietician ? 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 ?Home BP readings: none with her today - by recall 182/90, 176/89, 160/92 and 130/84 (unknown dates/times) ? ?Intolerances: amlodipine - dental issues, lisinopril - gout, metoprolol - nightmares, aspirin - hives ? ?Labs: 3/23:  Na 146, K 4.8, Glu 94, BUN 12, SCr 1.08, GFR 57 ? ? ?Wt Readings from Last 3 Encounters:  ?06/22/21 185 lb (83.9 kg)  ?06/22/21 192 lb 4.8 oz (87.2 kg)  ?05/14/21 185 lb 14.4 oz (84.3 kg)  ? ?BP Readings from Last 3 Encounters:  ?07/22/21 (!) 158/76  ?06/22/21 (!) 157/77  ?06/22/21 Marland Kitchen)  196/98  ? ?Pulse Readings from Last 3 Encounters:  ?07/22/21 76  ?06/22/21 87  ?06/22/21 89  ? ? ?Current Outpatient Medications  ?Medication Sig Dispense Refill  ? hydrALAZINE (APRESOLINE) 25 MG tablet Take 1 tablet (25 mg total) by mouth 3 (three) times daily. 90 tablet 3  ? irbesartan (AVAPRO) 150 MG tablet Take 1 tablet by mouth twice daily 180 tablet 3  ? labetalol (NORMODYNE) 200 MG tablet TAKE 2 TABLETS BY MOUTH THREE TIMES A DAY 180 tablet 5  ? rosuvastatin (CRESTOR) 10 MG tablet Take 1 tablet (10 mg total) by mouth daily. 90 tablet 3  ? spironolactone (ALDACTONE) 25 MG tablet Take 1 tablet (25 mg total) by mouth daily. 90 tablet 1  ? chlorthalidone (HYGROTON) 25 MG tablet Take 1 tablet (25 mg  total) by mouth daily. TO REPLACE HYDROCHLOROTHIAZIDE 30 tablet 3  ? ?No current facility-administered medications for this visit.  ? ? ?Allergies  ?Allergen Reactions  ? Amlodipine Other (See Comments)  ?  "Dental issues" ?  ? Aspirin Hives  ? Clonidine Derivatives Other (See Comments)  ?  Unknown reaction  ? Darvon [Propoxyphene Hcl] Other (See Comments)  ?  Dizziness  ? Lisinopril Other (See Comments)  ?  Gout ?  ? Toprol Xl [Metoprolol] Other (See Comments)  ?  Nightmares ?  ? Valium [Diazepam] Other (See Comments)  ?  Dizziness  ? ? ?Past Medical History:  ?Diagnosis Date  ? Anxiety   ? Atypical chest pain 03/28/2020  ? CKD (chronic kidney disease) stage 3, GFR 30-59 ml/min (HCC) 11/01/2019  ? Hypertension   ? Iron (Fe) deficiency anemia   ? Overweight 06/22/2021  ? Renal artery stenosis (HCC) 05/14/2021  ? ? ?Blood pressure (!) 158/76, pulse 76, resp. rate 16, height 5\' 4"  (1.626 m), SpO2 99 %. ?152/76 by me on repeat at end of visit. ? ?Hypertension ?Patient with resistant hypertension, currently not controlled on ARB/chlorthalidone, spironolactone and labetalol.  Unable to tolerate amlodipine 2/2 gingival problems.   Will add hydralazine 25 mg three times daily to her current regimen.  Reviewed dosing times and potential issues with new medication.  She was asked to keep a detailed BP log, noting food/stressors in the 24 hours prior to any spiked readings.  She does admit to eating [employer] as well as other fast food and sit down restaurants, so sodium could definitely have some impact.  Asked that she bring all medications, her home cuff and log of readings to her next appointment.  Will see her back in 4 weeks for follow up.  ? ? ? PharmD CPP [facility] ?[facility] ?[address] ?[place], [place] ?[phone] ?

## 2021-07-22 NOTE — Assessment & Plan Note (Signed)
Patient with resistant hypertension, currently not controlled on ARB/chlorthalidone, spironolactone and labetalol.  Unable to tolerate amlodipine 2/2 gingival problems.   Will add hydralazine 25 mg three times daily to her current regimen.  Reviewed dosing times and potential issues with new medication.  She was asked to keep a detailed BP log, noting food/stressors in the 24 hours prior to any spiked readings.  She does admit to eating [employer] as well as other fast food and sit down restaurants, so sodium could definitely have some impact.  Asked that she bring all medications, her home cuff and log of readings to her next appointment.  Will see her back in 4 weeks for follow up.  ?

## 2021-08-11 ENCOUNTER — Telehealth (HOSPITAL_BASED_OUTPATIENT_CLINIC_OR_DEPARTMENT_OTHER): Payer: Self-pay

## 2021-08-11 DIAGNOSIS — N1831 Chronic kidney disease, stage 3a: Secondary | ICD-10-CM

## 2021-08-11 NOTE — Telephone Encounter (Addendum)
Called results to patient and left results on VM (ok per DPR), instructions left to call office back if patient has any questions!   ? ?Study ordered.  ? ? ?----- Message from Chilton Si, MD sent at 08/11/2021  8:29 AM EDT ----- ?Mild blockage to both kidnies.  Repeat study in 1 year. ?

## 2021-08-16 ENCOUNTER — Ambulatory Visit: Payer: Medicare Other

## 2021-08-19 ENCOUNTER — Other Ambulatory Visit (HOSPITAL_BASED_OUTPATIENT_CLINIC_OR_DEPARTMENT_OTHER): Payer: Self-pay | Admitting: Cardiovascular Disease

## 2021-09-16 ENCOUNTER — Other Ambulatory Visit (HOSPITAL_BASED_OUTPATIENT_CLINIC_OR_DEPARTMENT_OTHER): Payer: Self-pay | Admitting: Cardiovascular Disease

## 2021-10-09 ENCOUNTER — Other Ambulatory Visit (HOSPITAL_BASED_OUTPATIENT_CLINIC_OR_DEPARTMENT_OTHER): Payer: Self-pay | Admitting: Cardiovascular Disease

## 2021-10-11 ENCOUNTER — Telehealth: Payer: Self-pay | Admitting: Internal Medicine

## 2021-10-11 NOTE — Telephone Encounter (Signed)
*  STAT* If patient is at the pharmacy, call can be transferred to refill team.   1. Which medications need to be refilled? (please list name of each medication and dose if known) spironolactone (ALDACTONE) 25 MG tablet  2. Which pharmacy/location (including street and city if local pharmacy) is medication to be sent to? [id number] - [place] ([place]), [place] - [address]  3. Do they need a 30 day or 90 day supply? 90

## 2021-10-11 NOTE — Telephone Encounter (Signed)
Called patient to advise medication refill sent to pharmacy °

## 2021-10-11 NOTE — Telephone Encounter (Signed)
Rx request sent to pharmacy.  

## 2021-10-18 ENCOUNTER — Other Ambulatory Visit: Payer: Self-pay | Admitting: Cardiovascular Disease

## 2021-10-19 NOTE — Telephone Encounter (Signed)
Rx request sent to pharmacy.  

## 2021-11-15 ENCOUNTER — Telehealth (HOSPITAL_BASED_OUTPATIENT_CLINIC_OR_DEPARTMENT_OTHER): Payer: Self-pay

## 2021-11-15 NOTE — Telephone Encounter (Signed)
Received fax from [facility] requesting Prior Auth for Irbesartan.  Gilmore Laroche Key: [id number] - PA Case ID: [id number] - Rx #: [id number]  Status Sent to Plan today  Drug Irbesartan 150MG  tablets  Form [employer] Medicare Part D Electronic Prior Authorization Form (2017 NCPDP)  Original Claim Info [id number] Provide Exception Process--Max daily dose of 1

## 2021-11-30 ENCOUNTER — Telehealth (HOSPITAL_BASED_OUTPATIENT_CLINIC_OR_DEPARTMENT_OTHER): Payer: Self-pay | Admitting: *Deleted

## 2021-11-30 NOTE — Telephone Encounter (Signed)
Received notification from [facility] advantage regarding Irbesartan 150 mg twice a day This is either not covered or has a quantity limit Irbesartan likely covered daily just not twice a day  Can try to give 300 mg and take 1/2 tablet twice a day  Left message to call back to discuss

## 2021-12-02 ENCOUNTER — Telehealth: Payer: Self-pay

## 2021-12-02 DIAGNOSIS — Z0000 Encounter for general adult medical examination without abnormal findings: Secondary | ICD-10-CM

## 2021-12-02 NOTE — Telephone Encounter (Signed)
Called patient to determine if she had returned the Vivify cuff after completing the program or if she still has access and need to return it. Left patient a message to return call to provide status update on device.    [place]aklee [place] Nedra Hai, [facility] Guide, Health Coach [address] [place] [address] Telephone: [phone] Email: [email or web address]

## 2021-12-30 NOTE — Telephone Encounter (Signed)
Left message to call back  

## 2022-01-13 IMAGING — MR MR HEAD W/O CM
4 of 6 series · 18 of 48 positions shown · non-contrast
Comparison: Noncontrast head CT 05/13/2021.

CLINICAL DATA: Provided history: Neuro deficit, acute, stroke
suspected. Additional history provided: Abnormal speech and
weakness, left-sided headache.

EXAM:
MRI HEAD WITHOUT CONTRAST
TECHNIQUE: Multiplanar, multiecho pulse sequences of the brain and surrounding
structures were obtained without intravenous contrast.

[Series 2: DWI · axial · 3.0mm · 0.94mm/px · z∈[-86,+38]mm · 8 of 90 slices shown (1 of 2)]
[im 1/90]
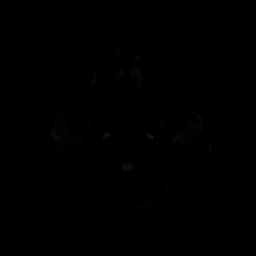
[im 14/90]
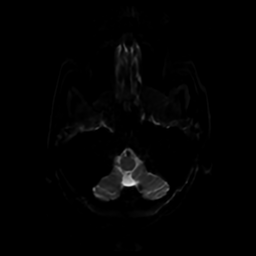
[im 28/90]
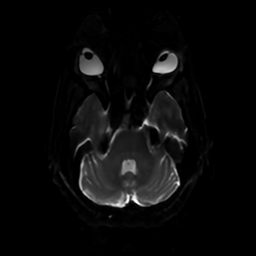
[im 42/90]
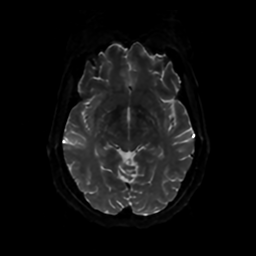
[im 48/90]
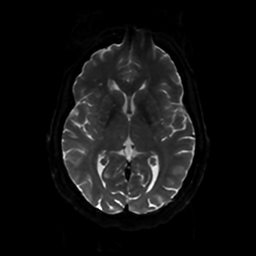
[im 62/90]
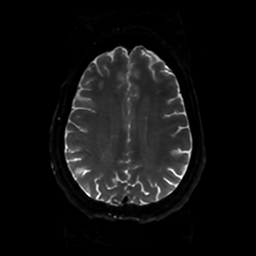
[im 76/90]
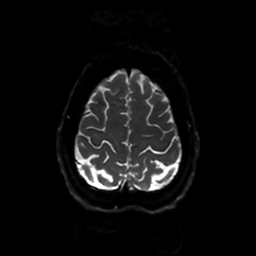
[im 90/90]
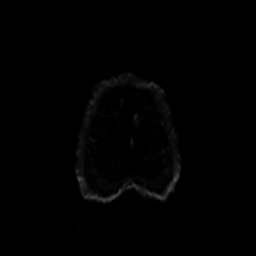

[Series 3: DWI · coronal · 4.0mm · 0.94mm/px · 3 of 70 slices shown (2 of 2)]
[im 13/70]
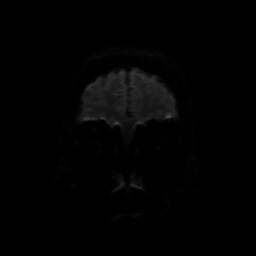
[im 38/70]
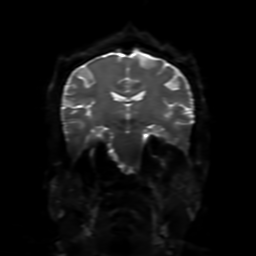
[im 63/70]
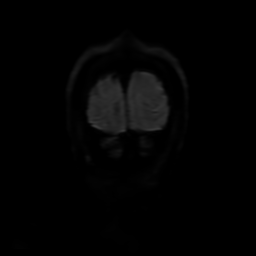

[Series 4: FLAIR · sagittal · 5.0mm · 0.23mm/px · 4 of 23 slices shown]
[im 1/23]
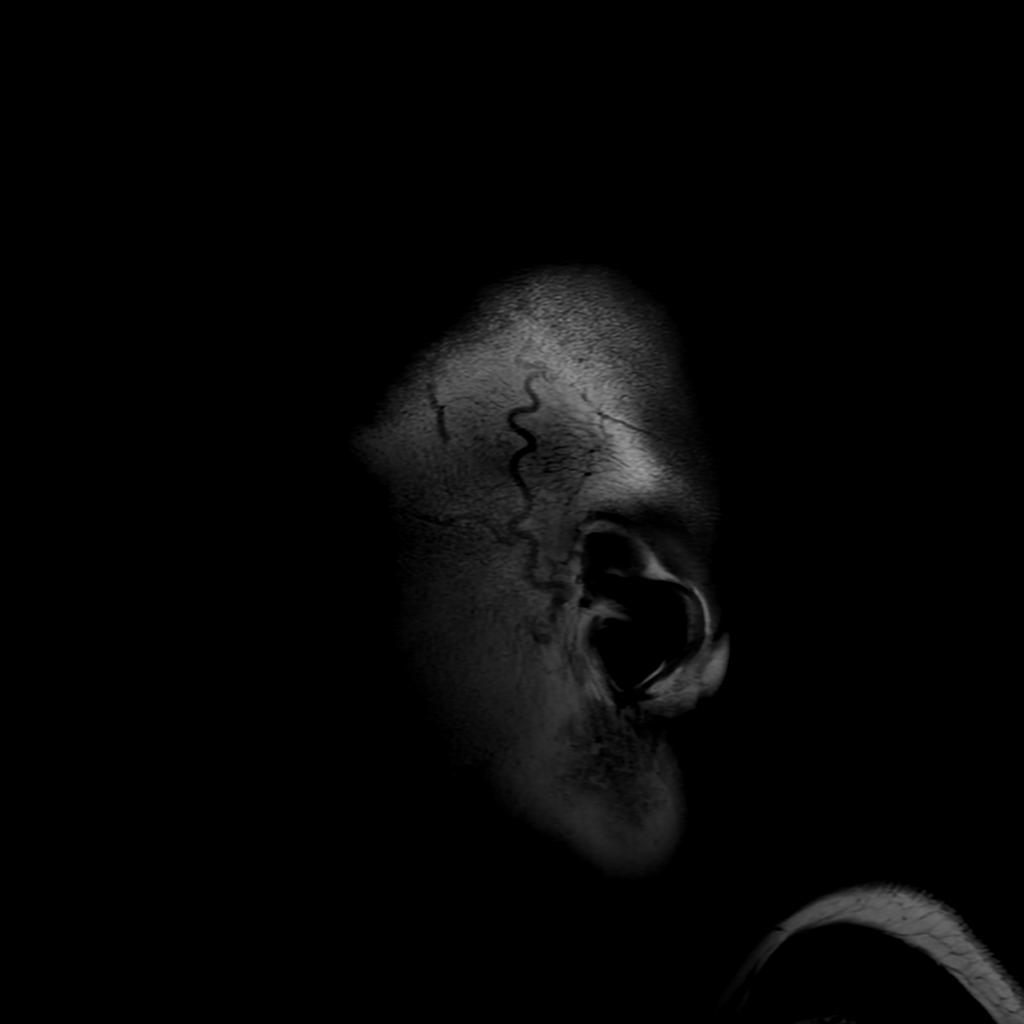
[im 8/23]
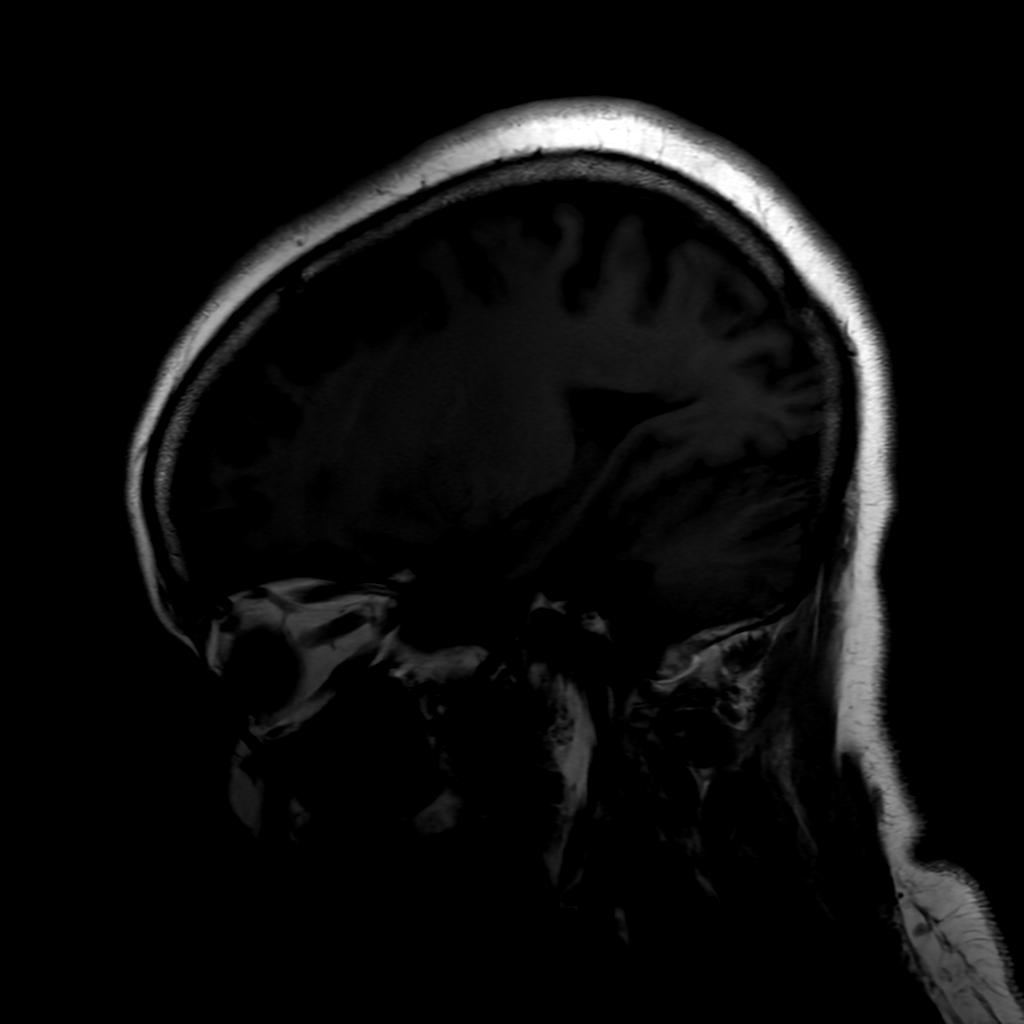
[im 15/23]
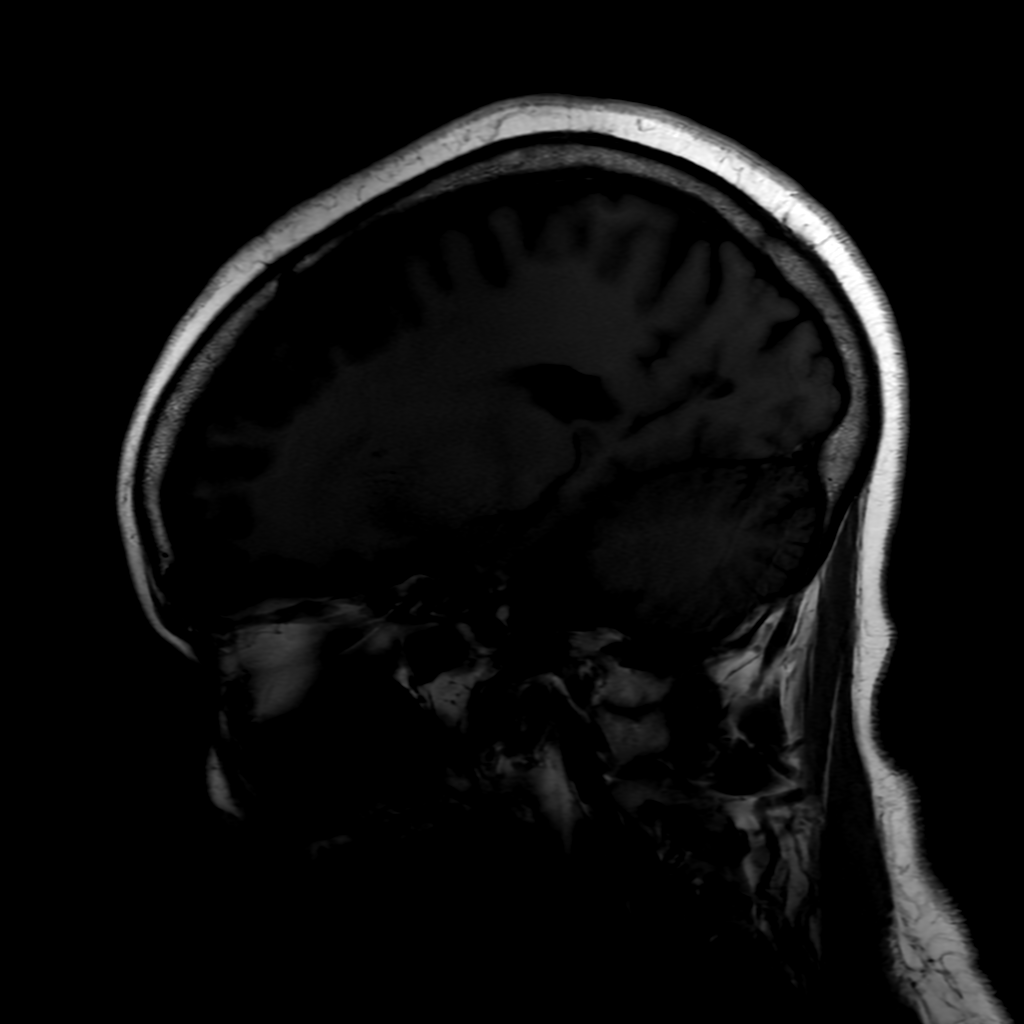
[im 23/23]
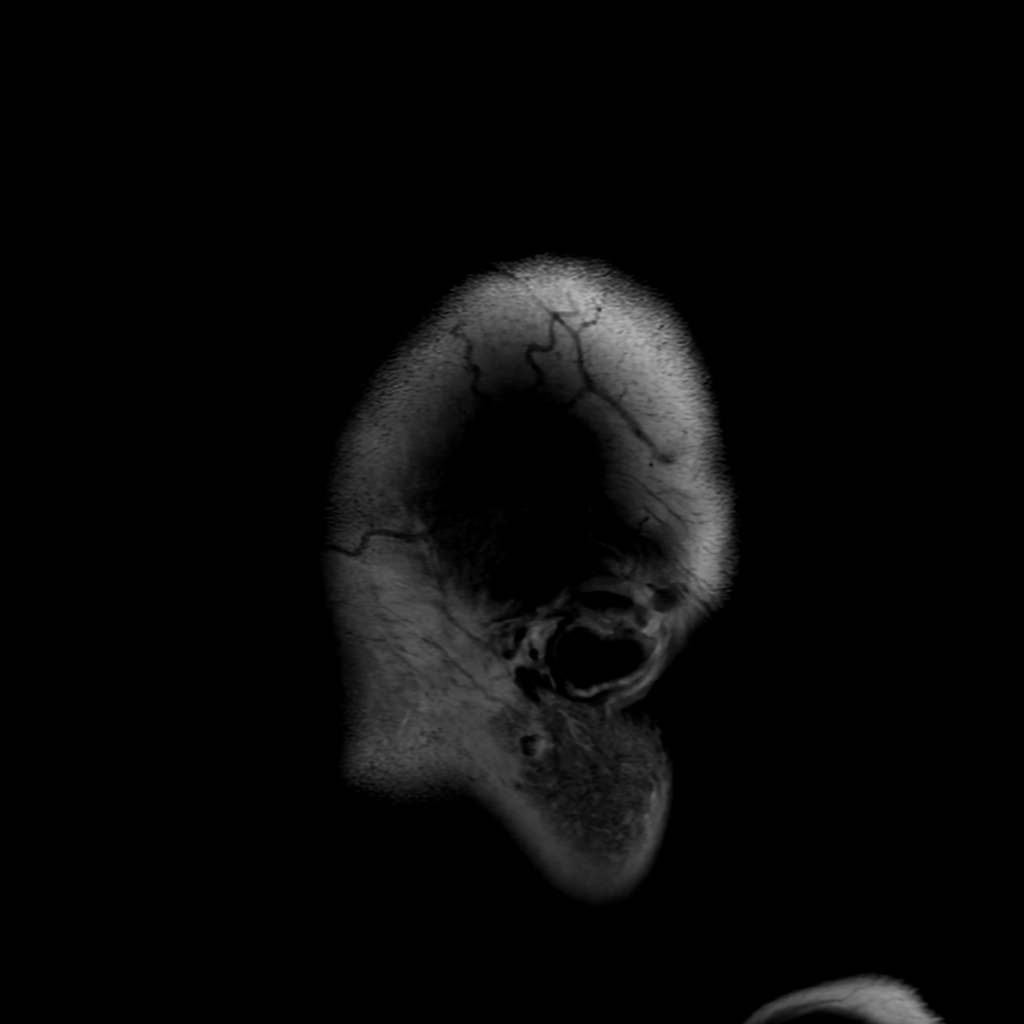

[Series 250: ADC · axial · 3.0mm · 0.94mm/px · z∈[-69,+19]mm · 3 of 45 slices shown]
[im 7/45]
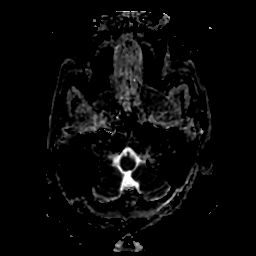
[im 26/45]
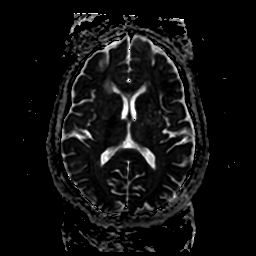
[im 38/45]
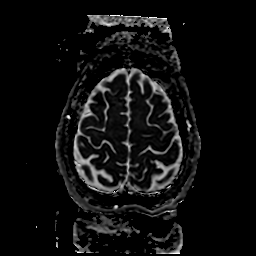

[18 of 48 positions shown; findings below may reference images not displayed]

FINDINGS: The patient was unable to tolerate the full examination. As a result
only the following sequences could be acquired. Axial and coronal
diffusion-weighted, sagittal T1 weighted and axial T2 TSE.

Brain:

Cerebral volume appears normal for age.

Mild multifocal T2 FLAIR hyperintense signal abnormality within the
cerebral white matter, nonspecific but compatible chronic small
vessel ischemic disease.

No evidence of acute infarct.

Within described limitations, no intracranial mass or extra-axial
fluid collection is identified. No midline shift. Lack of T2 GRE or
susceptibility weighted imaging precludes adequate evaluation for
intracranial blood products.

Vascular: Maintained flow voids within the proximal large arterial
vessels.

Skull and upper cervical spine: No focal suspicious marrow lesion.

Sinuses/Orbits: Visualized orbits show no acute finding. Mild
paranasal sinus mucosal thickening, most notably within the
bilateral ethmoid, left sphenoid and bilateral maxillary sinuses.
IMPRESSION: Prematurely terminated and limited examination, as described.

Diffusion-weighted imaging was completed. No evidence of acute
infarct.

Mild chronic small vessel ischemic changes within the cerebral white
matter.

Mild paranasal sinus mucosal thickening.

## 2022-01-13 IMAGING — CT CT HEAD CODE STROKE
4 series · 16 of 47 positions shown, 18 images · non-contrast
Comparison: 10/08/2019

CLINICAL DATA: Code stroke.



[Series 2: head wo · axial · 0.44mm/px · z∈[-104,+16]mm · 7 of 34 slices shown, 9 images]
[im 5/34  brain]
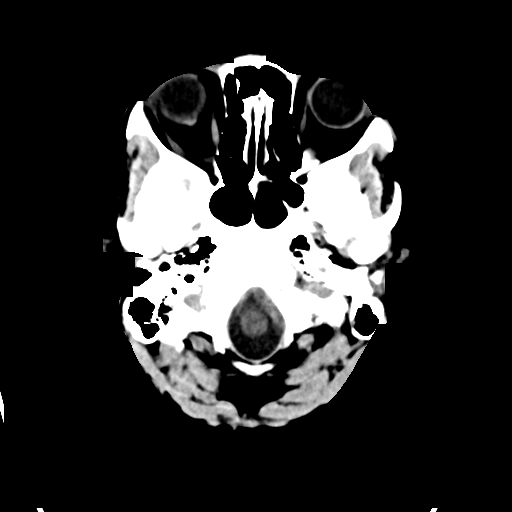
[im 5/34  bone]
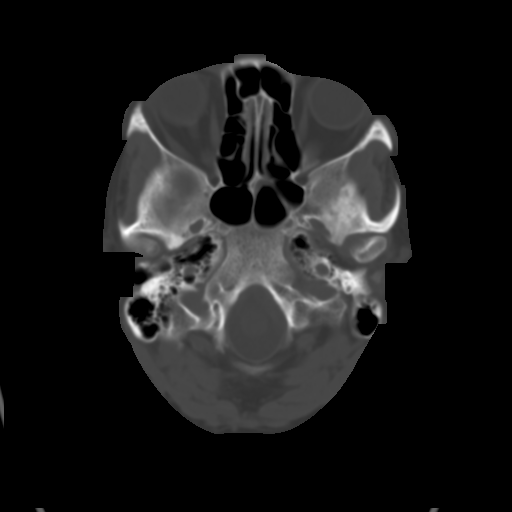
[im 9/34  brain]
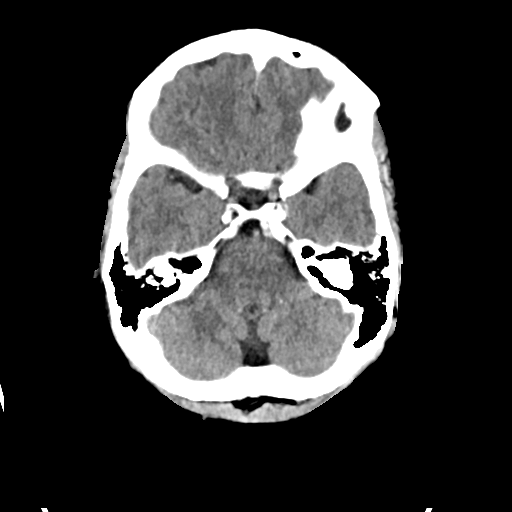
[im 13/34  brain]
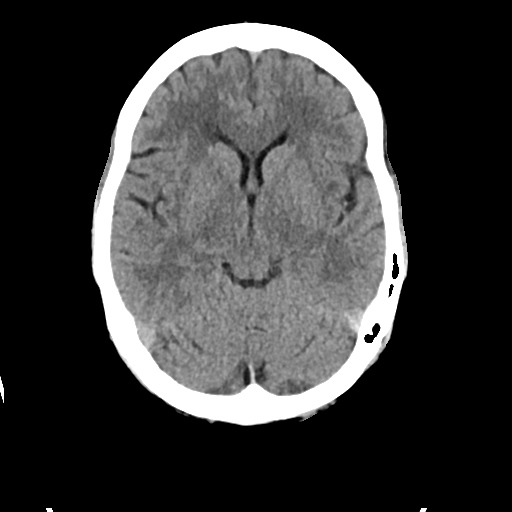
[im 17/34  brain]
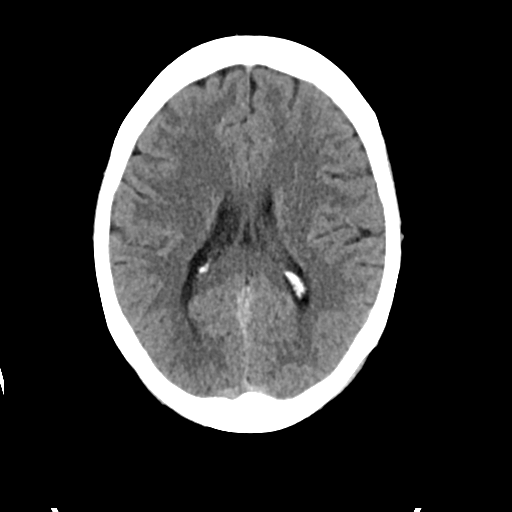
[im 21/34  brain]
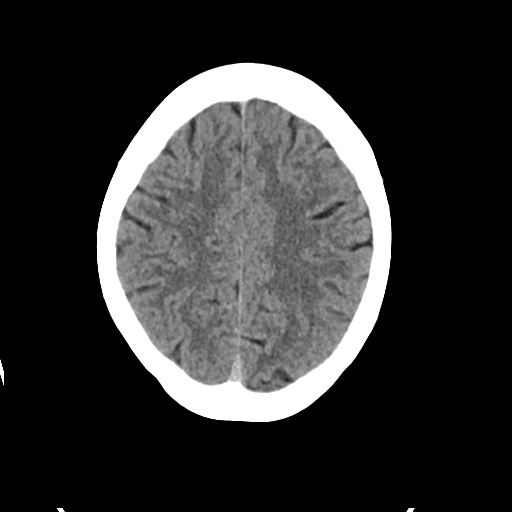
[im 21/34  bone]
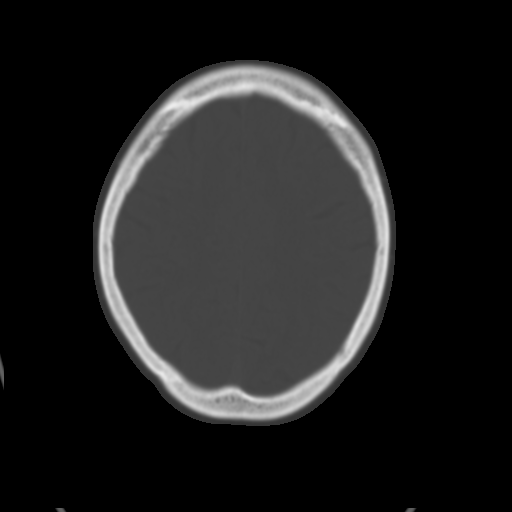
[im 25/34  brain]
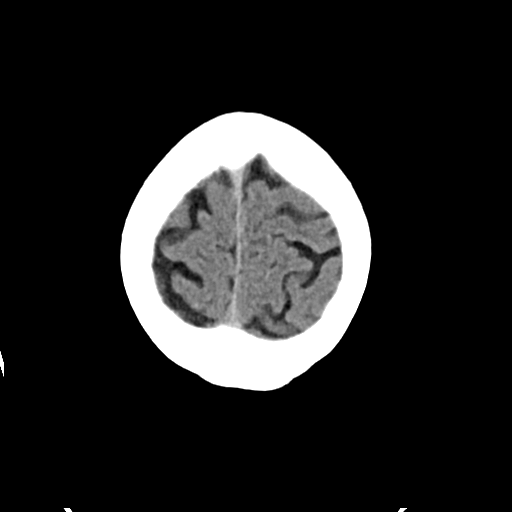
[im 29/34  brain]
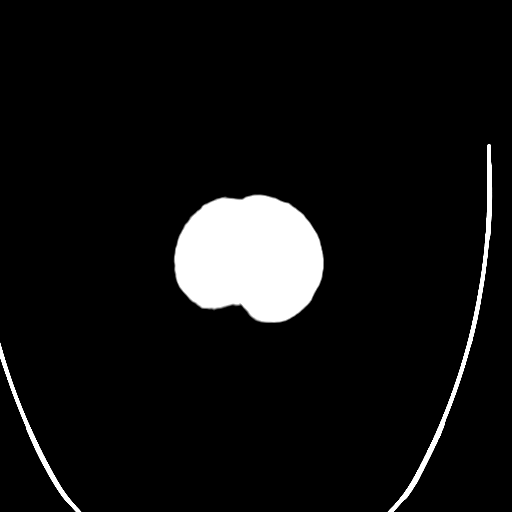

[Series 3: head bone · axial · 0.44mm/px · z∈[-108,-76]mm · 3 of 84 slices shown]
[im 9/84  bone]
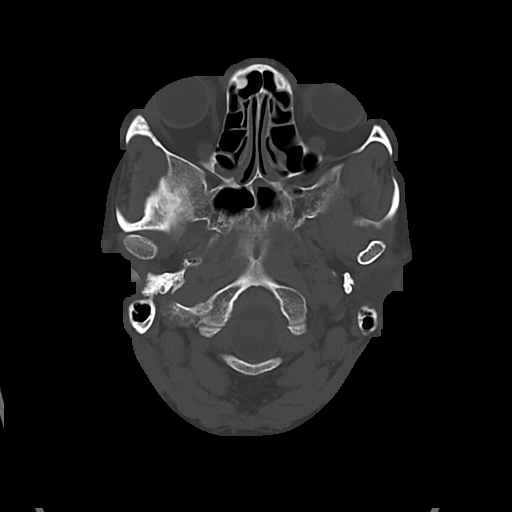
[im 17/84  bone]
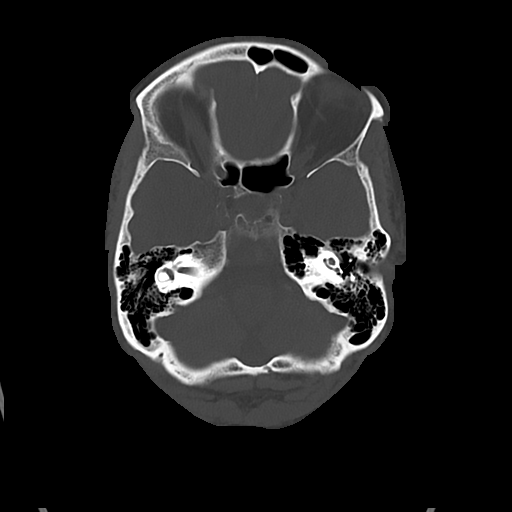
[im 25/84  bone]
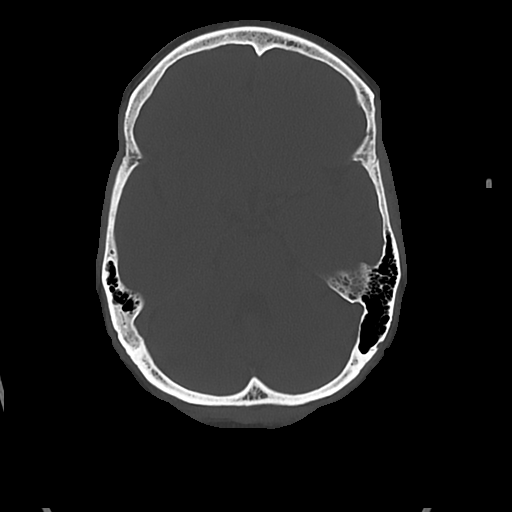

[Series 4: cor soft · coronal · 0.32mm/px · 3 of 70 slices shown]
[im 24/70  brain]
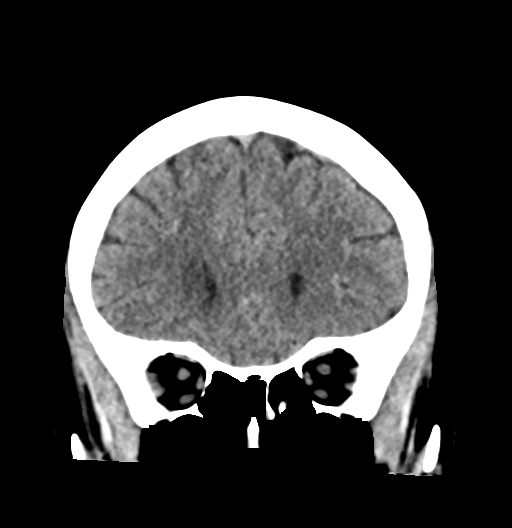
[im 31/70  brain]
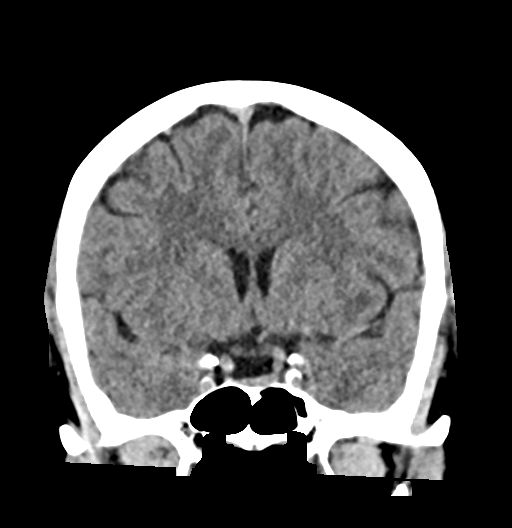
[im 39/70  brain]
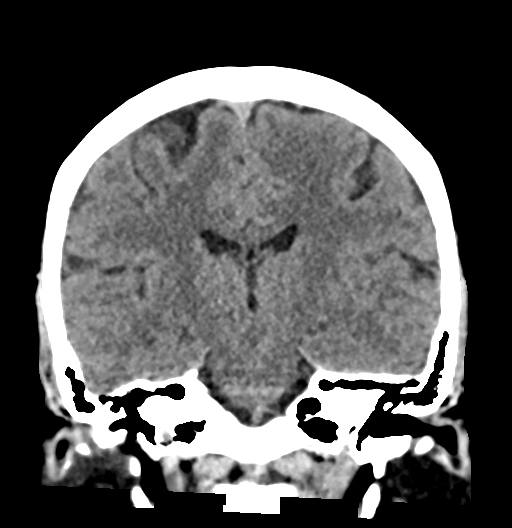

[Series 6: sag soft · sagittal · 0.33mm/px · 3 of 55 slices shown]
[im 19/55  brain]
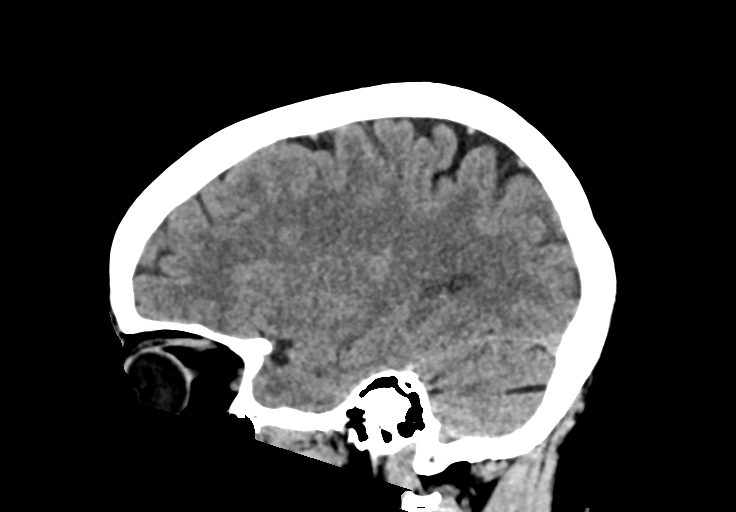
[im 28/55  brain]
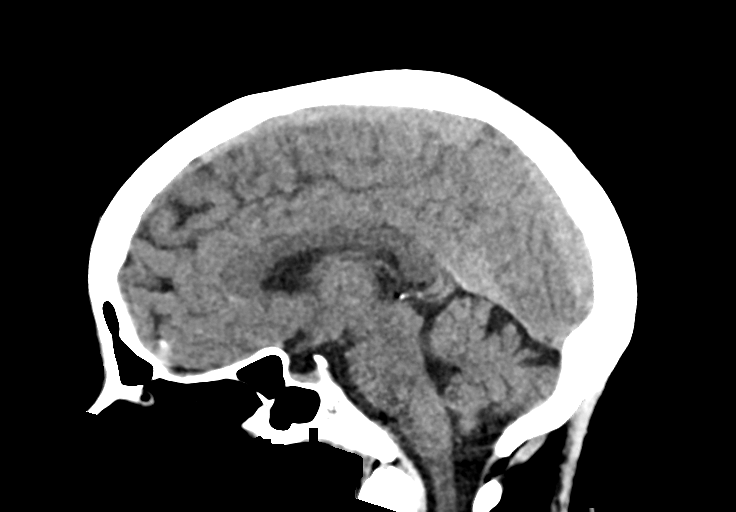
[im 37/55  brain]
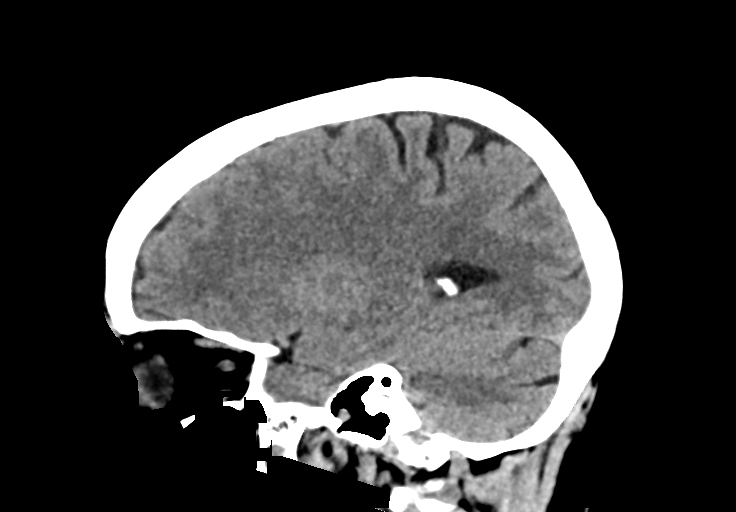

[16 of 47 positions shown; findings below may reference images not displayed]

FINDINGS: Brain: No evidence of acute infarction, hemorrhage, hydrocephalus,
extra-axial collection or mass lesion/mass effect.

Vascular: Question high-density at the level of the ACAs but not
clearly different from other vessel density or seen on all planes

Skull: Normal. Negative for fracture or focal lesion.

Sinuses/Orbits: Negative

Other: These results were communicated to Dr. Rukia at [DATE] on
05/13/2021 by text page via the [employer] messaging system.

ASPECTS (Alberta Stroke Program Early CT Score)

Not scored without localizing symptom.
IMPRESSION: Negative for hemorrhage or visible infarct.

## 2022-03-03 NOTE — Telephone Encounter (Signed)
Left message to call back   Also need to ask about Vivify blood pressure monitor and status of returning it

## 2022-04-14 NOTE — Telephone Encounter (Signed)
Patient never returned call  

## 2022-08-01 ENCOUNTER — Telehealth (HOSPITAL_BASED_OUTPATIENT_CLINIC_OR_DEPARTMENT_OTHER): Payer: Self-pay | Admitting: Cardiovascular Disease

## 2022-08-01 NOTE — Telephone Encounter (Signed)
Left message for patient to call and schedule the follow up renal artery duplex ordered by Dr. Oval Linsey

## 2022-08-04 NOTE — Telephone Encounter (Signed)
Left message for patient to call and discuss scheduling the Renal Artery Duplex ordered by Dr. Oval Linsey

## 2022-08-11 NOTE — Telephone Encounter (Signed)
Left message for patient to call and schedule the renal artery duplex ordered by Dr. [name]

## 2022-08-16 ENCOUNTER — Encounter (HOSPITAL_BASED_OUTPATIENT_CLINIC_OR_DEPARTMENT_OTHER): Payer: Self-pay | Admitting: Cardiovascular Disease

## 2022-09-12 ENCOUNTER — Ambulatory Visit: Payer: Medicare Other | Attending: Physician Assistant | Admitting: Physician Assistant

## 2022-09-12 ENCOUNTER — Encounter: Payer: Self-pay | Admitting: Physician Assistant

## 2022-09-12 VITALS — BP 160/78 | HR 89 | Temp 98.6°F | Ht 64.0 in | Wt 190.0 lb

## 2022-09-12 DIAGNOSIS — R1011 Right upper quadrant pain: Secondary | ICD-10-CM | POA: Diagnosis not present

## 2022-09-12 DIAGNOSIS — I160 Hypertensive urgency: Secondary | ICD-10-CM | POA: Diagnosis not present

## 2022-09-12 DIAGNOSIS — E870 Hyperosmolality and hypernatremia: Secondary | ICD-10-CM | POA: Diagnosis not present

## 2022-09-12 DIAGNOSIS — R002 Palpitations: Secondary | ICD-10-CM

## 2022-09-12 DIAGNOSIS — E782 Mixed hyperlipidemia: Secondary | ICD-10-CM

## 2022-09-12 DIAGNOSIS — R739 Hyperglycemia, unspecified: Secondary | ICD-10-CM | POA: Diagnosis not present

## 2022-09-12 MED ORDER — LABETALOL HCL 200 MG PO TABS: ORAL_TABLET | ORAL | 5 refills | Status: DC

## 2022-09-12 MED ORDER — IRBESARTAN 150 MG PO TABS: 150.0000 mg | ORAL_TABLET | Freq: Two times a day (BID) | ORAL | 6 refills | Status: DC

## 2022-09-12 MED ORDER — SPIRONOLACTONE 25 MG PO TABS: 25.0000 mg | ORAL_TABLET | Freq: Every day | ORAL | 1 refills | Status: DC

## 2022-09-12 MED ORDER — ROSUVASTATIN CALCIUM 10 MG PO TABS: 10.0000 mg | ORAL_TABLET | Freq: Every day | ORAL | 3 refills | Status: DC

## 2022-09-12 MED ORDER — HYDRALAZINE HCL 25 MG PO TABS: 25.0000 mg | ORAL_TABLET | Freq: Three times a day (TID) | ORAL | 3 refills | Status: DC

## 2022-09-12 NOTE — Progress Notes (Signed)
Pt is here for BP f/u   Complaining of right upper quad abdominal pain for X2 weeks   Complaining of right sided chest pain, pain is described as electric shocks per pt for X2 months   Complaining of discoloration on her left fourth toe

## 2022-09-12 NOTE — Progress Notes (Signed)
Patient ID: April [name], female   DOB: [birth year], 67 y.o.   MRN: [id number]     April [name], is a 67 y.o. female  [id number]  [id number]  DOB - March 03, 1956  No chief complaint on file.      Subjective:   April [name] is a 67 y.o. female here today for med RF.  She is concerned bc one of her toes looks darker than the others-4th toe of L foot.    Also, she has had a sense of an electrical impulse on the R side of her chest a couple of times the lasts <15 seconds.  No SOB/CP.  She is out of spironolactone.  She denies SOB.  No radiating or jaw symptoms.  Sensation is fleeting and has occurred a couple times in 2 months.  Has not occurred in the last few days and is not occurring now.  It has occurred at rest.    Also c/o 2 week h/o intermittent RUQ pain.  No N/V/D.  Not associated with eating.  Describes as cramping and mild in nature and lasts less than 1 min.  No urinary s/sx    No problems updated.  ALLERGIES: Allergies  Allergen Reactions   Amlodipine Other (See Comments)    "Dental issues"    Aspirin Hives   Clonidine Derivatives Other (See Comments)    Unknown reaction   Darvon [Propoxyphene Hcl] Other (See Comments)    Dizziness   Lisinopril Other (See Comments)    Gout    Toprol Xl [Metoprolol] Other (See Comments)    Nightmares    Valium [Diazepam] Other (See Comments)    Dizziness    PAST MEDICAL HISTORY: Past Medical History:  Diagnosis Date   Anxiety    Atypical chest pain 03/28/2020   CKD (chronic kidney disease) stage 3, GFR 30-59 ml/min (HCC) 11/01/2019   Hypertension    Iron (Fe) deficiency anemia    Overweight 06/22/2021   Renal artery stenosis (HCC) 05/14/2021    MEDICATIONS AT HOME: Prior to Admission medications   Medication Sig Start Date End Date Taking? Authorizing Provider  hydrALAZINE (APRESOLINE) 25 MG tablet Take 1 tablet (25 mg total) by mouth 3 (three) times daily. 09/12/22 12/11/22  Anders Simmonds, PA-C  irbesartan  (AVAPRO) 150 MG tablet Take 1 tablet (150 mg total) by mouth 2 (two) times daily. 09/12/22   Anders Simmonds, PA-C  labetalol (NORMODYNE) 200 MG tablet TAKE 2 TABLETS BY MOUTH THREE TIMES A DAY 09/12/22   Georgian Co M, PA-C  rosuvastatin (CRESTOR) 10 MG tablet Take 1 tablet (10 mg total) by mouth daily. 09/12/22 12/11/22  Anders Simmonds, PA-C  spironolactone (ALDACTONE) 25 MG tablet Take 1 tablet (25 mg total) by mouth daily. 09/12/22   Equan Cogbill, Marzella Schlein, PA-C    ROS: Neg HEENT Neg GU Neg MS Neg psych Neg neuro  Objective:   Vitals:   09/12/22 1404 09/12/22 1419  BP: (!) 178/78 (!) 160/78  Pulse: 89   Temp: 98.6 F (37 C)   SpO2: 98%   Weight: 190 lb (86.2 kg)   Height: 5\' 4"  (1.626 m)    Exam General appearance : Awake, alert, not in any distress. Speech Clear. Not toxic looking HEENT: Atraumatic and Normocephalic Neck: Supple, no JVD. No cervical lymphadenopathy.  Chest: Good air entry bilaterally, CTAB.  No rales/rhonchi/wheezing CVS: S1 S2 regular, no murmurs. No ectopy Abdomen: Bowel sounds present, Mild tenderness in RUQ without Murphy's. and not distended with no gaurding,  rigidity or rebound. Extremities: B/L Lower Ext shows no edema, both legs are warm to touch.  No abnormalities of toes.  = capillary RF B.   Neurology: Awake alert, and oriented X 3, CN II-XII intact, Non focal Skin: No Rash  Data Review Lab Results  Component Value Date   HGBA1C 4.9 10/06/2019    Assessment & Plan   1. Hypertensive urgency Resume spionolactone and check BP at home.  (She has been out of sprinolcatone - Comprehensive metabolic panel - spironolactone (ALDACTONE) 25 MG tablet; Take 1 tablet (25 mg total) by mouth daily.  Dispense: 90 tablet; Refill: 1 - hydrALAZINE (APRESOLINE) 25 MG tablet; Take 1 tablet (25 mg total) by mouth 3 (three) times daily.  Dispense: 90 tablet; Refill: 3 - labetalol (NORMODYNE) 200 MG tablet; TAKE 2 TABLETS BY MOUTH THREE TIMES A DAY  Dispense:  180 tablet; Refill: 5 - Ambulatory referral to Cardiology  2. Palpitations No ectopy-describes as electric shock on R side of chest that is mild in nature and not painful.  ?Zio monitor? - labetalol (NORMODYNE) 200 MG tablet; TAKE 2 TABLETS BY MOUTH THREE TIMES A DAY  Dispense: 180 tablet; Refill: 5 - Ambulatory referral to Cardiology  3. Hypernatremia - Ambulatory referral to Cardiology  4. RUQ pain Non-acute abdomen - Comprehensive metabolic panel  5. Hyperglycemia  I have advised the patient to work at a goal of eliminating sugary drinks, candy, desserts, sweets, refined sugars, processed foods, and white carbohydrates.  The patient expresses understanding.   - Comprehensive metabolic panel - Hemoglobin A1c  6. Mixed hyperlipidemia - rosuvastatin (CRESTOR) 10 MG tablet; Take 1 tablet (10 mg total) by mouth daily.  Dispense: 90 tablet; Refill: 3    Return in about 4 months (around 01/13/2023) for PCP for chronic conditions.  The patient was given clear instructions to go to ER or return to medical center if symptoms don't improve, worsen or new problems develop. The patient verbalized understanding. The patient was told to call to get lab results if they haven't heard anything in the next week.      Georgian Co, PA-C [facility] [place], [place] [phone]   09/12/2022, 2:24 PM

## 2022-09-13 LAB — COMPREHENSIVE METABOLIC PANEL
ALT: 17 IU/L (ref 0–32)
AST: 24 IU/L (ref 0–40)
Albumin/Globulin Ratio: 1.6 (ref 1.2–2.2)
Albumin: 4.4 g/dL (ref 3.9–4.9)
Alkaline Phosphatase: 110 IU/L (ref 44–121)
BUN/Creatinine Ratio: 12 (ref 12–28)
BUN: 12 mg/dL (ref 8–27)
Bilirubin Total: 0.6 mg/dL (ref 0.0–1.2)
CO2: 23 mmol/L (ref 20–29)
Calcium: 9.7 mg/dL (ref 8.7–10.3)
Chloride: 107 mmol/L — ABNORMAL HIGH (ref 96–106)
Creatinine, Ser: 1.02 mg/dL — ABNORMAL HIGH (ref 0.57–1.00)
Globulin, Total: 2.8 g/dL (ref 1.5–4.5)
Glucose: 79 mg/dL (ref 70–99)
Potassium: 4 mmol/L (ref 3.5–5.2)
Sodium: 143 mmol/L (ref 134–144)
Total Protein: 7.2 g/dL (ref 6.0–8.5)
eGFR: 60 mL/min/{1.73_m2} (ref >59)

## 2022-09-13 LAB — HEMOGLOBIN A1C
Est. average glucose Bld gHb Est-mCnc: 94 mg/dL
Hgb A1c MFr Bld: 4.9 % (ref 4.8–5.6)

## 2022-09-23 ENCOUNTER — Encounter: Payer: Self-pay | Admitting: Physician Assistant

## 2022-09-23 ENCOUNTER — Ambulatory Visit: Payer: Medicare Other | Attending: Physician Assistant | Admitting: Physician Assistant

## 2022-09-23 ENCOUNTER — Ambulatory Visit: Payer: Medicare Other

## 2022-09-23 VITALS — BP 130/62 | HR 83 | Ht 64.0 in | Wt 193.4 lb

## 2022-09-23 DIAGNOSIS — I701 Atherosclerosis of renal artery: Secondary | ICD-10-CM

## 2022-09-23 DIAGNOSIS — R002 Palpitations: Secondary | ICD-10-CM

## 2022-09-23 DIAGNOSIS — I10 Essential (primary) hypertension: Secondary | ICD-10-CM

## 2022-09-23 NOTE — Progress Notes (Unsigned)
Enrolled patient for a 14 day Zio XT monitor to be mailed to patients home  Patient had 2 monitors fall off prematurely.  Serial # [id number] from our office inventory was applied using tincture of benzoin.  [employer] notified.  Dr [name] to read

## 2022-09-23 NOTE — Patient Instructions (Signed)
Medication Instructions:  Your physician recommends that you continue on your current medications as directed. Please refer to the Current Medication list given to you today.  *If you need a refill on your cardiac medications before your next appointment, please call your pharmacy*   Lab Work: None   Testing/Procedures: Christena Deem- Long Term Monitor Instructions  Your physician has requested you wear a ZIO patch monitor for 14 days.  This is a single patch monitor. [employer] supplies one patch monitor per enrollment. Additional stickers are not available. Please do not apply patch if you will be having a Nuclear Stress Test,  Echocardiogram, Cardiac CT, MRI, or Chest Xray during the period you would be wearing the  monitor. The patch cannot be worn during these tests. You cannot remove and re-apply the  ZIO XT patch monitor.  Your ZIO patch monitor will be mailed 3 day USPS to your address on file. It may take 3-5 days  to receive your monitor after you have been enrolled.  Once you have received your monitor, please review the enclosed instructions. Your monitor  has already been registered assigning a specific monitor serial # to you.  Billing and Patient Assistance Program Information  We have supplied [employer] with any of your insurance information on file for billing purposes. [employer] offers a sliding scale Patient Assistance Program for patients that do not have  insurance, or whose insurance does not completely cover the cost of the ZIO monitor.  You must apply for the Patient Assistance Program to qualify for this discounted rate.  To apply, please call [employer] at [phone], select option 4, select option 2, ask to apply for  Patient Assistance Program. Meredeth Ide will ask your household income, and how many people  are in your household. They will quote your out-of-pocket cost based on that information.  [employer] will also be able to set up a 66-month, interest-free payment plan if  needed.  Applying the monitor   Shave hair from upper left chest.  Hold abrader disc by orange tab. Rub abrader in 40 strokes over the upper left chest as  indicated in your monitor instructions.  Clean area with 4 enclosed alcohol pads. Let dry.  Apply patch as indicated in monitor instructions. Patch will be placed under collarbone on left  side of chest with arrow pointing upward.  Rub patch adhesive wings for 2 minutes. Remove white label marked "1". Remove the white  label marked "2". Rub patch adhesive wings for 2 additional minutes.  While looking in a mirror, press and release button in center of patch. A small green light will  flash 3-4 times. This will be your only indicator that the monitor has been turned on.  Do not shower for the first 24 hours. You may shower after the first 24 hours.  Press the button if you feel a symptom. You will hear a small click. Record Date, Time and  Symptom in the Patient Logbook.  When you are ready to remove the patch, follow instructions on the last 2 pages of Patient  Logbook. Stick patch monitor onto the last page of Patient Logbook.  Place Patient Logbook in the blue and white box. Use locking tab on box and tape box closed  securely. The blue and white box has prepaid postage on it. Please place it in the mailbox as  soon as possible. Your physician should have your test results approximately 7 days after the  monitor has been mailed back to [facility].  Call [facility] Customer Care at [phone] if you have questions regarding  your ZIO XT patch monitor. Call them immediately if you see an orange light blinking on your  monitor.  If your monitor falls off in less than 4 days, contact our Monitor department at [phone].  If your monitor becomes loose or falls off after 4 days call [employer] at [phone] for  suggestions on securing your monitor    Follow-Up: At [facility], you and your health needs are  our priority.  As part of our continuing mission to provide you with exceptional heart care, we have created designated Provider Care Teams.  These Care Teams include your primary Cardiologist (physician) and Advanced Practice Providers (APPs -  Physician Assistants and Nurse Practitioners) who all work together to provide you with the care you need, when you need it.  Your next appointment:   6 week(s)  Provider:   Edd Fabian, FNP, Micah Flesher, PA-C, Marjie Skiff, PA-C, Juanda Crumble, PA-C, Joni Reining, DNP, ANP, Azalee Course, PA-C, Bernadene Person, NP, or Carlos Levering, NP

## 2022-09-23 NOTE — Progress Notes (Unsigned)
Cardiology Office Note:    Date:  09/24/2022   ID:  April GLATFELTER, DOB 1955-07-12, MRN [id number]  PCP:  April Register, MD   April [name] Cardiologist:  April Poisson, MD     Referring MD: April Register, MD   Chief Complaint  Patient presents with   Follow-up    Seen for Dr. [name]    History of Present Illness:    April [name] is a 67 y.o. female with a hx of hypertension, renal artery stenosis, HFpEF and obesity.  Patient was seen by Dr. [name] for post ED visit in 2021 after hypertensive urgency.  She was later referred to hypertension clinic run by Dr. Duke [name].  Renal artery Doppler in 2021 demonstrated 1 to 59% stenosis on the right, normal left renal artery.  A.m. cortisol was within normal limits.  Renal and aldosterone level were not consistent with hyperaldosteronism.  She was started on irbesartan in 2021 along which HCTZ.  HCTZ was later switched to chlorthalidone and irbesartan increased.  She was seen in the ED in January 2023 with speech difficulty and elevated blood pressure.  MRI of the brain was negative for acute process.  Her symptom improved after her blood pressure came down.  She did not take her blood pressure medication that day.  Labetalol was increased to 400 mg 3 times daily.  Catecholamine and metanephrine level were normal.  Repeated renal artery Doppler showed 1 to 59% stenosis bilaterally.  Patient was last seen by Dr. Duke [name] on 06/22/2021 at which time she continued to have occasional blood pressure spikes.  She complains of increased palpitation during the episode.  Her blood pressure medication at the time consisted of labetalol 400 mg 3 times a day, irbesartan 150 mg twice a day, chlorthalidone 25 mg daily and spironolactone 25 mg daily.  Unfortunately, she ended up in the ED the same day she saw Dr. Duke [name] with blood pressure of 200 and had numbness in her left chin.  CT of the head showed no acute abnormality.  MRI also  was negative for acute infarct, does show mild chronic microvascular ischemic changes in the white matter.  She was seen by our clinical pharmacist in March 2023 who added hydralazine 25 mg 3 times a day to her medical regimen.  She was instructed to return to follow-up in 1 month.  She was seen by her PCP in May 2024 at which time she described a sense of the electrical impulse on the right side of her chest that lasted less than 15 seconds.  Her blood pressure was high at that day, however she was also off of spironolactone.  Patient was referred to cardiology service for palpitation.  Patient presents today for follow-up.  She has been noticing some palpitation in her right chest, symptom occurs about 2-3 times per week.  She does have some mild lightheadedness but no feeling of passing out.  Her blood pressure today is 130/62 which is one of the best blood pressure she has had in the recent month.  I recommended 2-week ZIO monitor to assess her rhythm.  I plan to bring the patient back in 6 weeks for reassessment.  Past Medical History:  Diagnosis Date   Anxiety    Atypical chest pain 03/28/2020   CKD (chronic kidney disease) stage 3, GFR 30-59 ml/min (HCC) 11/01/2019   Hypertension    Iron (Fe) deficiency anemia    Overweight 06/22/2021   Renal artery stenosis (HCC)  05/14/2021    Past Surgical History:  Procedure Laterality Date   CESAREAN SECTION      Current Medications: Current Meds  Medication Sig   hydrALAZINE (APRESOLINE) 25 MG tablet Take 1 tablet (25 mg total) by mouth 3 (three) times daily.   irbesartan (AVAPRO) 150 MG tablet Take 1 tablet (150 mg total) by mouth 2 (two) times daily.   labetalol (NORMODYNE) 200 MG tablet TAKE 2 TABLETS BY MOUTH THREE TIMES A DAY   rosuvastatin (CRESTOR) 10 MG tablet Take 1 tablet (10 mg total) by mouth daily.   spironolactone (ALDACTONE) 25 MG tablet Take 1 tablet (25 mg total) by mouth daily.     Allergies:   Amlodipine, Aspirin, Clonidine  derivatives, Darvon [propoxyphene hcl], Lisinopril, Toprol xl [metoprolol], and Valium [diazepam]   Social History   Socioeconomic History   Marital status: Married    Spouse name: Not on file   Number of children: Not on file   Years of education: Not on file   Highest education level: Not on file  Occupational History   Occupation: retired  Tobacco Use   Smoking status: Never   Smokeless tobacco: Never  Substance and Sexual Activity   Alcohol use: Not Currently    Comment: rare   Drug use: Never   Sexual activity: Not on file  Other Topics Concern   Not on file  Social History Narrative   Not on file   Social Determinants of Health   Financial Resource Strain: Not on file  Food Insecurity: Not on file  Transportation Needs: Not on file  Physical Activity: Not on file  Stress: Stress Concern Present (01/31/2020)   [employer] of Occupational Health - Occupational Stress Questionnaire    Feeling of Stress : Very much  Social Connections: Not on file     Family History: The patient's family history includes CVA in her mother and sister; Hypertension in her brother, maternal grandmother, mother, and sister. There is no history of Kidney failure.  ROS:   Please see the history of present illness.     All other systems reviewed and are negative.  EKGs/Labs/Other Studies Reviewed:    The following studies were reviewed today:  Echo 09/29/2019 1. Hyperdynamic LV systolic function; grade 1 diastolic dysfunction.   2. Left ventricular ejection fraction, by estimation, is 70 to 75%. The  left ventricle has hyperdynamic function. The left ventricle has no  regional wall motion abnormalities. Left ventricular diastolic parameters  are consistent with Grade I diastolic  dysfunction (impaired relaxation).   3. Right ventricular systolic function is normal. The right ventricular  size is normal. There is normal pulmonary artery systolic pressure.   4. The mitral valve  is normal in structure. Trivial mitral valve  regurgitation. No evidence of mitral stenosis.   5. The aortic valve is tricuspid. Aortic valve regurgitation is not  visualized. No aortic stenosis is present.   6. The inferior vena cava is normal in size with greater than 50%  respiratory variability, suggesting right atrial pressure of 3 mmHg.   EKG:  EKG is ordered today.  The ekg ordered today demonstrates normal sinus rhythm, no significant ST-T wave changes.  Recent Labs: 09/12/2022: ALT 17; BUN 12; Creatinine, Ser 1.02; Potassium 4.0; Sodium 143  Recent Lipid Panel    Component Value Date/Time   CHOL 159 05/20/2021 0800   TRIG 95 05/20/2021 0800   HDL 45 05/20/2021 0800   CHOLHDL 3.5 05/20/2021 0800   LDLCALC 96 05/20/2021  0800     Risk Assessment/Calculations:      STOP-Bang Score:          Physical Exam:    VS:  BP 130/62 (BP Location: Left Arm, Patient Position: Sitting, Cuff Size: Large)   Pulse 83   Ht 5\' 4"  (1.626 m)   Wt 193 lb 6.4 oz (87.7 kg)   SpO2 96%   BMI 33.20 kg/m         Wt Readings from Last 3 Encounters:  09/23/22 193 lb 6.4 oz (87.7 kg)  09/12/22 190 lb (86.2 kg)  06/22/21 185 lb (83.9 kg)     GEN:  Well nourished, well developed in no acute distress HEENT: Normal NECK: No JVD; No carotid bruits LYMPHATICS: No lymphadenopathy CARDIAC: RRR, no murmurs, rubs, gallops RESPIRATORY:  Clear to auscultation without rales, wheezing or rhonchi  ABDOMEN: Soft, non-tender, non-distended MUSCULOSKELETAL:  No edema; No deformity  SKIN: Warm and dry NEUROLOGIC:  Alert and oriented x 3 PSYCHIATRIC:  Normal affect   ASSESSMENT:    1. Palpitations   2. Hypertension, unspecified type   3. Renal artery stenosis (HCC)    PLAN:    In order of problems listed above:  Palpitation: Plan to proceed with 2-week ZIO monitor.  Hypertension: Blood pressure well-controlled on current therapy  History of renal artery stenosis: Last renal artery ultrasound  obtained on 05/20/2021 demonstrated 1 to 59% disease in bilateral renal artery.           Medication Adjustments/Labs and Tests Ordered: Current medicines are reviewed at length with the patient today.  Concerns regarding medicines are outlined above.  Orders Placed This Encounter  Procedures   LONG TERM MONITOR (3-14 DAYS)   EKG 12-Lead   No orders of the defined types were placed in this encounter.   Patient Instructions  Medication Instructions:  Your physician recommends that you continue on your current medications as directed. Please refer to the Current Medication list given to you today.  *If you need a refill on your cardiac medications before your next appointment, please call your pharmacy*   Lab Work: None   Testing/Procedures: Christena Deem- Long Term Monitor Instructions  Your physician has requested you wear a ZIO patch monitor for 14 days.  This is a single patch monitor. [employer] supplies one patch monitor per enrollment. Additional stickers are not available. Please do not apply patch if you will be having a Nuclear Stress Test,  Echocardiogram, Cardiac CT, MRI, or Chest Xray during the period you would be wearing the  monitor. The patch cannot be worn during these tests. You cannot remove and re-apply the  ZIO XT patch monitor.  Your ZIO patch monitor will be mailed 3 day USPS to your address on file. It may take 3-5 days  to receive your monitor after you have been enrolled.  Once you have received your monitor, please review the enclosed instructions. Your monitor  has already been registered assigning a specific monitor serial # to you.  Billing and Patient Assistance Program Information  We have supplied [employer] with any of your insurance information on file for billing purposes. [employer] offers a sliding scale Patient Assistance Program for patients that do not have  insurance, or whose insurance does not completely cover the cost of the ZIO monitor.  You  must apply for the Patient Assistance Program to qualify for this discounted rate.  To apply, please call [employer] at [phone], select option 4, select option 2, ask to apply  for  Patient Assistance Program. Meredeth Ide will ask your household income, and how many people  are in your household. They will quote your out-of-pocket cost based on that information.  [employer] will also be able to set up a 56-month, interest-free payment plan if needed.  Applying the monitor   Shave hair from upper left chest.  Hold abrader disc by orange tab. Rub abrader in 40 strokes over the upper left chest as  indicated in your monitor instructions.  Clean area with 4 enclosed alcohol pads. Let dry.  Apply patch as indicated in monitor instructions. Patch will be placed under collarbone on left  side of chest with arrow pointing upward.  Rub patch adhesive wings for 2 minutes. Remove white label marked "1". Remove the white  label marked "2". Rub patch adhesive wings for 2 additional minutes.  While looking in a mirror, press and release button in center of patch. A small green light will  flash 3-4 times. This will be your only indicator that the monitor has been turned on.  Do not shower for the first 24 hours. You may shower after the first 24 hours.  Press the button if you feel a symptom. You will hear a small click. Record Date, Time and  Symptom in the Patient Logbook.  When you are ready to remove the patch, follow instructions on the last 2 pages of Patient  Logbook. Stick patch monitor onto the last page of Patient Logbook.  Place Patient Logbook in the blue and white box. Use locking tab on box and tape box closed  securely. The blue and white box has prepaid postage on it. Please place it in the mailbox as  soon as possible. Your physician should have your test results approximately 7 days after the  monitor has been mailed back to [facility].  Call [facility] Customer Care at [phone]  if you have questions regarding  your ZIO XT patch monitor. Call them immediately if you see an orange light blinking on your  monitor.  If your monitor falls off in less than 4 days, contact our Monitor department at [phone].  If your monitor becomes loose or falls off after 4 days call [employer] at [phone] for  suggestions on securing your monitor    Follow-Up: At [facility], you and your health needs are our priority.  As part of our continuing mission to provide you with exceptional heart care, we have created designated Provider Care Teams.  These Care Teams include your primary Cardiologist (physician) and [name] (APPs -  Physician Assistants and Nurse Practitioners) who all work together to provide you with the care you need, when you need it.  Your next appointment:   6 week(s)  Provider:   Edd Fabian, FNP, Micah Flesher, PA-C, Marjie Skiff, PA-C, Juanda Crumble, PA-C, Joni Reining, DNP, ANP, Azalee Course, PA-C, Bernadene Person, NP, or Carlos Levering, NP    Signed, Azalee Course, [place]  09/24/2022 6:21 PM    [facility]

## 2022-09-24 ENCOUNTER — Encounter: Payer: Self-pay | Admitting: Physician Assistant

## 2022-09-30 ENCOUNTER — Telehealth: Payer: Self-pay | Admitting: Internal Medicine

## 2022-09-30 ENCOUNTER — Ambulatory Visit: Payer: Medicare Other | Attending: Internal Medicine

## 2022-09-30 NOTE — Telephone Encounter (Signed)
Patient is requesting to speak with someone today in regards to the zio monitor. She states that the monitor continues to fall off and she would like advice as to how to best keep it secure. Please advise.

## 2022-10-07 DIAGNOSIS — R002 Palpitations: Secondary | ICD-10-CM | POA: Diagnosis not present

## 2022-10-31 ENCOUNTER — Ambulatory Visit: Payer: Medicare Other | Admitting: Adult Health

## 2022-11-14 ENCOUNTER — Encounter: Payer: Self-pay | Admitting: Family Medicine

## 2022-11-14 ENCOUNTER — Ambulatory Visit: Payer: Medicare Other | Attending: Family Medicine | Admitting: Family Medicine

## 2022-11-14 VITALS — BP 110/67 | HR 72 | Ht 64.0 in | Wt 187.4 lb

## 2022-11-14 DIAGNOSIS — I5032 Chronic diastolic (congestive) heart failure: Secondary | ICD-10-CM | POA: Diagnosis not present

## 2022-11-14 DIAGNOSIS — I701 Atherosclerosis of renal artery: Secondary | ICD-10-CM | POA: Diagnosis not present

## 2022-11-14 DIAGNOSIS — Z1159 Encounter for screening for other viral diseases: Secondary | ICD-10-CM | POA: Diagnosis not present

## 2022-11-14 DIAGNOSIS — I110 Hypertensive heart disease with heart failure: Secondary | ICD-10-CM | POA: Diagnosis not present

## 2022-11-14 DIAGNOSIS — M109 Gout, unspecified: Secondary | ICD-10-CM

## 2022-11-14 DIAGNOSIS — N189 Chronic kidney disease, unspecified: Secondary | ICD-10-CM

## 2022-11-14 MED ORDER — EMPAGLIFLOZIN 10 MG PO TABS: 10.0000 mg | ORAL_TABLET | Freq: Every day | ORAL | 1 refills | Status: DC

## 2022-11-14 NOTE — Progress Notes (Signed)
Referral to kidney doctor and dietitian. Medication refill

## 2022-11-14 NOTE — Patient Instructions (Signed)
Empagliflozin Tablets What is this medication? EMPAGLIFLOZIN (EM pa gli FLOE zin) treats type 2 diabetes. It works by helping your kidneys remove sugar (glucose) from your blood through the urine, which decreases your blood sugar.  It may also be used to lower the risk of worsening disease and death caused by kidney disease and heart failure. It works by helping your kidneys remove salt (sodium) from your blood through the urine. This decreases the amount of work the kidneys and heart have to do. Changes to diet and exercise are often combined with this medication. This medicine may be used for other purposes; ask your health care provider or pharmacist if you have questions. COMMON BRAND NAME(S): Jardiance What should I tell my care team before I take this medication? They need to know if you have any of these conditions: Change in diet, eating less Changes to your insulin dose Dehydration Diet low in salt Frequently drink alcohol Having surgery History of amputation History of diabetic ketoacidosis (DKA) History of pancreatitis or pancreatic surgery History of genital yeast infections Infection Kidney disease Liver disease Trouble passing urine Type 1 diabetes An unusual or allergic reaction to empagliflozin, other medications, foods, dyes, or preservatives Pregnant or trying to get pregnant Breastfeeding How should I use this medication? Take this medication by mouth with water. Take it as directed on the prescription label at the same time every day. You may take it with or without food. Keep taking it unless your care team tells you to stop. A special MedGuide will be given to you by the pharmacist with each prescription and refill. Be sure to read this information carefully each time. Talk to your care team about the use of this medication in children. While it may be prescribed for children as young as 10 years for selected conditions, precautions do apply. Overdosage: If you  think you have taken too much of this medicine contact a poison control center or emergency room at once. NOTE: This medicine is only for you. Do not share this medicine with others. What if I miss a dose? If you miss a dose, take it as soon as you can. If it is almost time for your next dose, take only that dose. Do not take double or extra doses. What may interact with this medication? Alcohol Diuretics Insulin Lithium This list may not describe all possible interactions. Give your health care provider a list of all the medicines, herbs, non-prescription drugs, or dietary supplements you use. Also tell them if you smoke, drink alcohol, or use illegal drugs. Some items may interact with your medicine. What should I watch for while using this medication? Visit your care team for regular checks on your progress. Tell your care team if your symptoms do not start to get better or if they get worse. You may need blood work done while you are taking this medication. If you have diabetes, your care team will monitor your HbA1C (A1C). This test shows what your average blood sugar (glucose) level was over the past 2 to 3 months. This medication may increase the risk of diabetic ketoacidosis (DKA), a serious condition in which high ketone levels make your blood too acidic. Your care team may ask you to check for ketones in your urine or blood while you are taking this medication. If you develop nausea, vomiting, stomach pain, unusual weakness or fatigue, or trouble breathing, stop taking this medication and call your care team right away. Check with your care team if  you have severe diarrhea, nausea, and vomiting, or if you sweat a lot. The loss of too much body fluid may make it dangerous for you to take this medication. Know the symptoms of low blood sugar and know how to treat it. Always carry a source of quick sugar with you. Examples include hard sugar candy or glucose tablets. Make sure others know that  you can choke if you eat or drink if your blood sugar is too low and you are unable to care for yourself. Get medical help at once. Tell your care team if you have high blood sugar. Your medication dose may change if your body is under stress. Some types of stress that may affect your blood sugar include fever, infection, and surgery. If you have diabetes, you may get a false-positive result for sugar in your urine while you are taking this medication. Check with your care team. What side effects may I notice from receiving this medication? Side effects that you should report to your care team as soon as possible: Allergic reactions--skin rash, itching, hives, swelling of the face, lips, tongue, or throat Dehydration--increased thirst, dry mouth, feeling faint or lightheaded, headache, dark yellow or brown urine Diabetic ketoacidosis (DKA)--increased thirst or amount of urine, dry mouth, fatigue, fruity odor to breath, trouble breathing, stomach pain, nausea, vomiting Genital yeast infection--redness, swelling, pain, or itchiness, odor, thick or lumpy discharge New pain or tenderness, change in skin color, sores or ulcers, infection of the leg or foot Infection or redness, swelling, tenderness, or pain in the genitals, or area from the genitals to the back of the rectum Urinary tract infection (UTI)--burning when passing urine, passing frequent small amounts of urine, bloody or cloudy urine, pain in the lower back or sides This list may not describe all possible side effects. Call your doctor for medical advice about side effects. You may report side effects to FDA at 1-800-FDA-1088. Where should I keep my medication? Keep out of the reach of children and pets. Store at room temperature between 20 and 25 degrees C (68 and 77 degrees F). Get rid of any unused medication after the expiration date. To get rid of medications that are no longer needed or have expired: Take the medication to a medication  take-back program. Check with your pharmacy or law enforcement to find a location. If you cannot return the medication, check the label or package insert to see if the medication should be thrown out in the garbage or flushed down the toilet. If you are not sure, ask your care team. If it is safe to put it in the trash, take the medication out of the container. Mix the medication with cat litter, dirt, coffee grounds, or other unwanted substance. Seal the mixture in a bag or container. Put it in the trash. NOTE: This sheet is a summary. It may not cover all possible information. If you have questions about this medicine, talk to your doctor, pharmacist, or health care provider.  2024 Elsevier/Gold Standard (2022-06-26 00:00:00)

## 2022-11-14 NOTE — Progress Notes (Signed)
Subjective:  Patient ID: April [name], female    DOB: 1955/12/29  Age: 67 y.o. MRN: [id number]  CC: Hypertension   HPI April [name] is a 67 y.o. year old female with a history of hypertension, hyperlipidemia, renal artery stenosis, HFpEF (EF 70-75 % from echo 2021)  Interval History: Discussed the use of AI scribe software for clinical note transcription with the patient, who gave verbal consent to proceed.  She presents with ongoing concerns about her kidney health and diet. She reports that her last blood work indicated dehydration in her kidneys. She expresses a desire for guidance on managing her potassium levels, protein intake, and water intake, as she is aware that these factors can impact kidney health. She has sought a consultation with a registered dietician, but was told that her insurance would not cover it unless her kidney disease was more advanced. Reviewed her labs and GFR is normal with a creatinine of 1.02 from 08/2022 however sometime in 2021 she did have a creatinine which maxed out at 1.31.  She also has a history of renal artery stenosis with a 1 to 59% stenosis.  The patient also reports chronic shortness of breath, which she describes as a constant issue rather than an intermittent one. She states that she feels winded even after walking a short distance, such as from the front to the back of her house.  Endorses presence of pedal edema but no orthopnea or PND.  This has been a long-standing issue, which she has mentioned to previous doctors.  She had a visit with the cardiology nurse practitioner 2 months ago and remains on spironolactone, hydralazine, labetalol, Avapro and she is also on Crestor.  In addition to these concerns, the patient has a history of gout and arthritic knees, which limit her ability to exercise. She expresses a desire to lose weight to alleviate the burden on her heart and knees.        Past Medical History:  Diagnosis Date   Anxiety     Atypical chest pain 03/28/2020   CKD (chronic kidney disease) stage 3, GFR 30-59 ml/min (HCC) 11/01/2019   Hypertension    Iron (Fe) deficiency anemia    Overweight 06/22/2021   Renal artery stenosis (HCC) 05/14/2021    Past Surgical History:  Procedure Laterality Date   CESAREAN SECTION      Family History  Problem Relation Age of Onset   CVA Mother    Hypertension Mother    Hypertension Sister    CVA Sister    Hypertension Brother    Hypertension Maternal Grandmother    Kidney failure Neg Hx     Social History   Socioeconomic History   Marital status: Married    Spouse name: Not on file   Number of children: Not on file   Years of education: Not on file   Highest education level: Not on file  Occupational History   Occupation: retired  Tobacco Use   Smoking status: Never   Smokeless tobacco: Never  Substance and Sexual Activity   Alcohol use: Not Currently    Comment: rare   Drug use: Never   Sexual activity: Not on file  Other Topics Concern   Not on file  Social History Narrative   Not on file   Social Determinants of Health   Financial Resource Strain: Not on file  Food Insecurity: Not on file  Transportation Needs: Not on file  Physical Activity: Not on file  Stress: Stress  Concern Present (01/31/2020)   [employer] of Occupational Health - Occupational Stress Questionnaire    Feeling of Stress : Very much  Social Connections: Not on file    Allergies  Allergen Reactions   Amlodipine Other (See Comments)    "Dental issues"    Aspirin Hives   Clonidine Derivatives Other (See Comments)    Unknown reaction   Darvon [Propoxyphene Hcl] Other (See Comments)    Dizziness   Lisinopril Other (See Comments)    Gout    Toprol Xl [Metoprolol] Other (See Comments)    Nightmares    Valium [Diazepam] Other (See Comments)    Dizziness    Outpatient Medications Prior to Visit  Medication Sig Dispense Refill   hydrALAZINE (APRESOLINE) 25 MG tablet  Take 1 tablet (25 mg total) by mouth 3 (three) times daily. 90 tablet 3   irbesartan (AVAPRO) 150 MG tablet Take 1 tablet (150 mg total) by mouth 2 (two) times daily. 60 tablet 6   labetalol (NORMODYNE) 200 MG tablet TAKE 2 TABLETS BY MOUTH THREE TIMES A DAY 180 tablet 5   rosuvastatin (CRESTOR) 10 MG tablet Take 1 tablet (10 mg total) by mouth daily. 90 tablet 3   spironolactone (ALDACTONE) 25 MG tablet Take 1 tablet (25 mg total) by mouth daily. 90 tablet 1   No facility-administered medications prior to visit.     ROS Review of Systems  Constitutional:  Negative for activity change and appetite change.  HENT:  Negative for sinus pressure and sore throat.   Respiratory:  Positive for shortness of breath. Negative for chest tightness and wheezing.   Cardiovascular:  Positive for leg swelling. Negative for chest pain and palpitations.  Gastrointestinal:  Negative for abdominal distention, abdominal pain and constipation.  Genitourinary: Negative.   Musculoskeletal: Negative.   Psychiatric/Behavioral:  Negative for behavioral problems and dysphoric mood.     Objective:  BP 110/67   Pulse 72   Ht 5\' 4"  (1.626 m)   Wt 187 lb 6.4 oz (85 kg)   SpO2 99%   BMI 32.17 kg/m      11/14/2022    3:47 PM 09/23/2022    8:38 AM 09/12/2022    2:19 PM  BP/Weight  Systolic BP 110 130 160  Diastolic BP 67 62 78  Wt. (Lbs) 187.4 193.4   BMI 32.17 kg/m2 33.2 kg/m2       Physical Exam Constitutional:      Appearance: She is well-developed.  Cardiovascular:     Rate and Rhythm: Normal rate.     Heart sounds: Normal heart sounds. No murmur heard. Pulmonary:     Effort: Pulmonary effort is normal.     Breath sounds: Normal breath sounds. No wheezing or rales.  Chest:     Chest wall: No tenderness.  Abdominal:     General: Bowel sounds are normal. There is no distension.     Palpations: Abdomen is soft. There is no mass.     Tenderness: There is no abdominal tenderness.   Musculoskeletal:        General: Normal range of motion.     Right lower leg: No edema.     Left lower leg: No edema.  Neurological:     Mental Status: She is alert and oriented to person, place, and time.  Psychiatric:        Mood and Affect: Mood normal.        Latest Ref Rng & Units 09/12/2022    2:33 PM  07/02/2021    3:06 PM 06/22/2021   12:38 PM  CMP  Glucose 70 - 99 mg/dL 79  94  161   BUN 8 - 27 mg/dL 12  12  10    Creatinine 0.57 - 1.00 mg/dL 0.96  0.45  4.09   Sodium 134 - 144 mmol/L 143  146  141   Potassium 3.5 - 5.2 mmol/L 4.0  4.8  3.7   Chloride 96 - 106 mmol/L 107  109  106   CO2 20 - 29 mmol/L 23  23  25    Calcium 8.7 - 10.3 mg/dL 9.7  9.9  9.3   Total Protein 6.0 - 8.5 g/dL 7.2     Total Bilirubin 0.0 - 1.2 mg/dL 0.6     Alkaline Phos 44 - 121 IU/L 110     AST 0 - 40 IU/L 24     ALT 0 - 32 IU/L 17       Lipid Panel     Component Value Date/Time   CHOL 159 05/20/2021 0800   TRIG 95 05/20/2021 0800   HDL 45 05/20/2021 0800   CHOLHDL 3.5 05/20/2021 0800   LDLCALC 96 05/20/2021 0800    CBC    Component Value Date/Time   WBC 7.2 06/22/2021 1238   RBC 4.53 06/22/2021 1238   HGB 13.5 06/22/2021 1238   HCT 39.6 06/22/2021 1238   PLT 247 06/22/2021 1238   MCV 87.4 06/22/2021 1238   MCH 29.8 06/22/2021 1238   MCHC 34.1 06/22/2021 1238   RDW 12.2 06/22/2021 1238   LYMPHSABS 1.4 06/22/2021 1238   MONOABS 0.5 06/22/2021 1238   EOSABS 0.2 06/22/2021 1238   BASOSABS 0.0 06/22/2021 1238    Lab Results  Component Value Date   HGBA1C 4.9 09/12/2022    Assessment & Plan:      Chronic Kidney Disease: Stable kidney function with no evidence of dehydration.  In the past she did have elevated creatinine which has resolved.  Patient expressed desire for dietary guidance to manage CKD and prevent progression. Noted renal artery stenosis on chart review. -Counseled on plant-based diets -Refer to nephrologist for CKD management and dietary guidance. -Refer to  vascular specialist for renal artery stenosis evaluation.  Congestive Heart Failure (Diastolic): Euvolemic with EF of 70 to 75% from echo 2021, grade 1 DD.  Chronic shortness of breath despite good ejection fraction. Patient has difficulty with physical activity due to breathlessness. -Start Jardiance to improve cardiovascular outcomes and renal protection, monitor for hypoglycemia. -Check kidney function in two weeks after starting Jardiance. -Refer to dietician for dietary guidance to manage CHF and assist with weight loss. -Continue to follow-up with cardiology  Gout: History of gout, no current flare or medication. -No immediate intervention needed, monitor for flares.  Palpitations: Wore Zio patch, results pending. -Follow up on Zio patch results after cardiology appointment in August.          Meds ordered this encounter  Medications   empagliflozin (JARDIANCE) 10 MG TABS tablet    Sig: Take 1 tablet (10 mg total) by mouth daily before breakfast. For CHF    Dispense:  90 tablet    Refill:  1    Follow-up: Return in about 3 months (around 02/14/2023) for Chronic medical conditions.       Hoy Register, MD, FAAFP. [facility] [place], [place] [phone]   11/14/2022, 4:34 PM

## 2022-11-21 ENCOUNTER — Ambulatory Visit: Payer: Self-pay | Admitting: *Deleted

## 2022-11-21 NOTE — Telephone Encounter (Signed)
  Chief Complaint: Are there samples of the Jardiance 10 mg she can take for the initial period before buying this?   It's an expensive medication.    Symptoms: N/A Frequency: N/A Pertinent Negatives: Patient denies N/A Disposition: [] ED /[] Urgent Care (no appt availability in office) / [] Appointment(In office/virtual)/ []  [facility]/ [] Home Care/ [] Refused Recommended Disposition /[] [facility]/ [x]  Follow-up with PCP Additional Notes: Does the office have samples of Jardiance?   Pt was agreeable to someone calling her back.

## 2022-11-21 NOTE — Telephone Encounter (Signed)
Message from [place] M sent at 11/21/2022  3:02 PM EDT  Summary: Rx concerns   Pt requests that a nurse return her call as she has some concerns about a Rx. Pt did not have the name of the Rx. Cb# [phone]          Call History  Contact Date/Time Type Contact Phone/Fax User  11/21/2022 03:01 PM EDT Phone (Incoming) April [name], April [name] (Self) [phone] April [name]   Reason for Disposition  [1] Caller has URGENT medicine question about med that PCP or specialist prescribed AND [2] triager unable to answer question    Wanting to know if there are samples she can take before buying this medication since it's expensive.  Answer Assessment - Initial Assessment Questions 1. NAME of MEDICINE: "What medicine(s) are you calling about?"     Jardiance 10 mg 2. QUESTION: "What is your question?" (e.g., double dose of medicine, side effect)     Are there samples I can take before buying this medication?   It's expensive. 3. PRESCRIBER: "Who prescribed the medicine?" Reason: if prescribed by specialist, call should be referred to that group.     Dr. Alvis Lemmings 4. SYMPTOMS: "Do you have any symptoms?" If Yes, ask: "What symptoms are you having?"  "How bad are the symptoms (e.g., mild, moderate, severe)    N/A 5. PREGNANCY:  "Is there any chance that you are pregnant?" "When was your last menstrual period?"     N/A  Protocols used: Medication Question Call-A-AH

## 2022-11-23 ENCOUNTER — Other Ambulatory Visit: Payer: Self-pay

## 2022-11-29 ENCOUNTER — Other Ambulatory Visit: Payer: Self-pay

## 2022-11-29 DIAGNOSIS — I5032 Chronic diastolic (congestive) heart failure: Secondary | ICD-10-CM | POA: Diagnosis not present

## 2022-11-29 DIAGNOSIS — Z1159 Encounter for screening for other viral diseases: Secondary | ICD-10-CM

## 2022-11-29 DIAGNOSIS — I110 Hypertensive heart disease with heart failure: Secondary | ICD-10-CM | POA: Diagnosis not present

## 2022-12-01 ENCOUNTER — Other Ambulatory Visit: Payer: Self-pay | Admitting: *Deleted

## 2022-12-01 DIAGNOSIS — I701 Atherosclerosis of renal artery: Secondary | ICD-10-CM

## 2022-12-04 NOTE — Progress Notes (Deleted)
Cardiology Office Note:    Date:  12/04/2022   ID:  April [name], DOB March 05, 1956, MRN [id number]  PCP:  Hoy Register, MD  Cardiologist:  Parke Poisson, MD  Electrophysiologist:  None   Referring MD: Hoy Register, MD   Chief Complaint: follow-up of palpitations   History of Present Illness:    April [name] is a 67 y.o. female with a history of chronic HFpEF, palpitations with rare PACs/ PVCs noted on monitor in 08/2022, renal artery stenosis, resistant hypertension, hyperlipidemia, CKD stage III, iron deficiency anemia, and anxiety who is followed by Dr. [name] and presents today for follow-up of palpitations.   Patient was first seen by Dr. [name] in 10/2019 after an ED visit for hypertensive urgency. Echo in 08/2019 prior to visit showed normal LV function and grade 1 diastolic dysfunction. She was ultimately referred to Dr. [name] and underwent work-up for secondary hypertension. AM cortisol, renin/ aldosterone levels, and catecholamines/ metanephrine level were normal. Renal artery dopplers showed 1-59% stenosis on the right and were normal on the left. Most recent dopplers in 05/2021 showed 1-59% of bilateral renal arteries. She was last seen by Dr. [name] in 06/2021 at which time she continued to report occasional spikes in her BP with increased palpitations during the episodes. She ended up in the ED later that same day after her systolic BP spiked to the 200s and she had associated numbness in her left chin. Head CT showed no acute findings. Brain MRI was also negative for acute infarct but showed mild chronic microvascular ischemic changes. She was subsequently seen in our [facility] in 08/2021 who adjusted her medications further. She was instructed to follow-up in 1 month but was lost to follow-up until 08/2022 when she was seen by Azalee Course, PA-C for palpitations. At that visit, she reported palpitations in her right chest occurring 2-3 times per week with associated mild  lightheadedness but no syncope. 2 week Zio monitor was ordered for further evaluation. Preliminary read of monitor showed rare PACs/ PVCs but no significant arrhythmias.   Patient presents today for follow-up. ***  Palpitations History of palpitations. Monitor in 08/2022 showed rare PACs/ PVCs but no significant arrhythmias.  - *** - Continue Labetalol 400mg  three times daily.   Chronic HFpEF Echo in 08/2019 showed LVEF of 70-75% with normal wall motion and grade 1 diastolic dysfunction, normal RV seize and function, and no significant valvular disease.  - Euvolemic on exam.  - Continue Spironolactone 25mg  daily.  - Continue Jardiance 10mg  daily.   Resistant Hypertension History of resistant hypertension. Work-up for secondary hypertension has been unremarkable. AM cortisol, renin/ aldosterone levels, and catecholamines/ metanephrine level were normal. Renal artery dopplers showed 1-59% stenosis on of bilateral renal arteries.  - BP *** - Continue current medications: Irbesartan 150mg  twice daily, Labetalol 400mg  three times daily, Spironolactone 25mg  daily, and Hydralazine 25mg  three times daily.   Hyperlipidemia Lipid panel in 05/2021: Total Cholesterol 159, Triglycerides 95, HDL 45, LDL 96. LDL goal <70 given renal artery stenosis.  - Currently on Crestor 10mg  daily. Will increase to 20mg  daily. *** - Will repeat lipid panel and LFTs in 6-8 weeks. ***  Renal Artery Stenosis Last renal artery dopplers in 05/2021 showed 1-59% stenosis of bilateral renal arteries. - Intolerant to Aspirin (causes hives). - Continue statin as above. - Repeat dopplers are scheduled for later this month.   EKGs/Labs/Other Studies Reviewed:    The following studies were reviewed today:  Echocardiogram 09/29/2019: Impressions:  1. Hyperdynamic LV systolic function; grade 1 diastolic dysfunction.   2. Left ventricular ejection fraction, by estimation, is 70 to 75%. The  left ventricle has hyperdynamic  function. The left ventricle has no  regional wall motion abnormalities. Left ventricular diastolic parameters  are consistent with Grade I diastolic  dysfunction (impaired relaxation).   3. Right ventricular systolic function is normal. The right ventricular  size is normal. There is normal pulmonary artery systolic pressure.   4. The mitral valve is normal in structure. Trivial mitral valve  regurgitation. No evidence of mitral stenosis.   5. The aortic valve is tricuspid. Aortic valve regurgitation is not  visualized. No aortic stenosis is present.   6. The inferior vena cava is normal in size with greater than 50%  respiratory variability, suggesting right atrial pressure of 3 mmHg.  _______________  Renal Artery Doppler 05/20/2021: Summary:  Largest Aortic Diameter: 2.0 cm    Renal:  - Right: Normal size right kidney. Abnormal right Resistive Index. Normal cortical thickness of right kidney. RRV flow present. 1-59% stenosis (low end range) of the right renal artery.  - Left:  Normal size of left kidney. Abnormal left Resisitve Index. Normal cortical thickness of the left kidney. LRV flow present. 1-59% (low end range) stenosis of the left renal artery.  Mesenteric:  - Normal Celiac artery and Superior Mesenteric artery findings.  _______________  Monitor 09/19/2022 to 09/30/2022 (Preliminary Read): Patient had a min HR of 61 bpm, max HR of 109 bpm, and avg HR of 79 bpm. Predominant underlying rhythm was Sinus Rhythm. Isolated SVEs were rare (<1.0%), and no SVE Couplets or SVE Triplets were present. Isolated VEs were rare (<1.0%), VE Couplets were  rare (<1.0%), and no VE Triplets were present.   EKG:  EKG not ordered today.   Recent Labs: 11/29/2022: ALT 11; BUN 16; Creatinine, Ser 1.11; Potassium 4.2; Sodium 140  Recent Lipid Panel    Component Value Date/Time   CHOL 159 05/20/2021 0800   TRIG 95 05/20/2021 0800   HDL 45 05/20/2021 0800   CHOLHDL 3.5 05/20/2021 0800    LDLCALC 96 05/20/2021 0800    Physical Exam:    Vital Signs: There were no vitals taken for this visit.    Wt Readings from Last 3 Encounters:  11/14/22 187 lb 6.4 oz (85 kg)  09/23/22 193 lb 6.4 oz (87.7 kg)  09/12/22 190 lb (86.2 kg)     General: 67 y.o. female in no acute distress. HEENT: Normocephalic and atraumatic. Sclera clear. EOMs intact. Neck: Supple. No carotid bruits. No JVD. Heart: *** RRR. Distinct S1 and S2. No murmurs, gallops, or rubs. Radial and distal pedal pulses 2+ and equal bilaterally. Lungs: No increased work of breathing. Clear to ausculation bilaterally. No wheezes, rhonchi, or rales.  Abdomen: Soft, non-distended, and non-tender to palpation. Bowel sounds present in all 4 quadrants.  MSK: Normal strength and tone for age. *** Extremities: No lower extremity edema.    Skin: Warm and dry. Neuro: Alert and oriented x3. No focal deficits. Psych: Normal affect. Responds appropriately.   Assessment:    No diagnosis found.  Plan:     Disposition: Follow up in ***   Medication Adjustments/Labs and Tests Ordered: Current medicines are reviewed at length with the patient today.  Concerns regarding medicines are outlined above.  No orders of the defined types were placed in this encounter.  No orders of the defined types were placed in this encounter.   There are no  Patient Instructions on file for this visit.   Signed, Corrin Parker, PA-C  12/04/2022 12:03 PM    [facility]

## 2022-12-05 ENCOUNTER — Encounter: Payer: Self-pay | Admitting: Family Medicine

## 2022-12-05 ENCOUNTER — Ambulatory Visit: Payer: Medicare Other | Attending: Family Medicine | Admitting: Family Medicine

## 2022-12-05 VITALS — BP 128/71 | HR 90 | Temp 98.2°F | Ht 64.0 in | Wt 189.6 lb

## 2022-12-05 DIAGNOSIS — Z1211 Encounter for screening for malignant neoplasm of colon: Secondary | ICD-10-CM

## 2022-12-05 DIAGNOSIS — Z0000 Encounter for general adult medical examination without abnormal findings: Secondary | ICD-10-CM

## 2022-12-05 DIAGNOSIS — E2839 Other primary ovarian failure: Secondary | ICD-10-CM

## 2022-12-05 DIAGNOSIS — Z1231 Encounter for screening mammogram for malignant neoplasm of breast: Secondary | ICD-10-CM

## 2022-12-05 NOTE — Progress Notes (Signed)
Subjective:   April [name] is a 67 y.o. female who presents for Medicare Annual (Subsequent) preventive examination. Medical history significant for stage III CKD, hypertension, hyperlipidemia, renal artery stenosis, HFpEF (EF 70-75 % from echo 2021)  She exercises regularly.  Visit Complete: In person   Review of Systems    General: negative for fever, weight loss, appetite change Eyes: no visual symptoms. ENT: no ear symptoms, no sinus tenderness, no nasal congestion or sore throat. Neck: no pain  Respiratory: no wheezing, shortness of breath, cough Cardiovascular: no chest pain, no dyspnea on exertion, no pedal edema, no orthopnea. Gastrointestinal: no abdominal pain, no diarrhea, no constipation Genito-Urinary: no urinary frequency, no dysuria, no polyuria. Hematologic: no bruising Endocrine: no cold or heat intolerance Neurological: no headaches, no seizures, no tremors Musculoskeletal: no joint pains, no joint swelling Skin: no pruritus, no rash. Psychological: no depression, no anxiety,   Cardiac Risk Factors include: none     Objective:    Today's Vitals   12/05/22 1006  BP: 128/71  Pulse: 90  Temp: 98.2 F (36.8 C)  TempSrc: Oral  SpO2: 98%  Weight: 189 lb 9.6 oz (86 kg)  Height: 5\' 4"  (1.626 m)   Body mass index is 32.54 kg/m.     12/05/2022   10:04 AM 05/13/2021    7:27 AM 10/08/2019    8:07 PM 10/06/2019   10:15 AM 10/01/2019    9:24 AM 09/29/2019   12:00 PM 09/29/2019    5:46 AM  Advanced Directives  Does Patient Have a Medical Advance Directive? Yes No No No No No No  Type of Estate agent of [place];Living will        Would patient like information on creating a medical advance directive?  No - Patient declined  No - Patient declined No - Patient declined No - Patient declined     Current Medications (verified) Outpatient Encounter Medications as of 12/05/2022  Medication Sig   hydrALAZINE (APRESOLINE) 25 MG tablet Take 1  tablet (25 mg total) by mouth 3 (three) times daily.   irbesartan (AVAPRO) 150 MG tablet Take 1 tablet (150 mg total) by mouth 2 (two) times daily.   labetalol (NORMODYNE) 200 MG tablet TAKE 2 TABLETS BY MOUTH THREE TIMES A DAY   rosuvastatin (CRESTOR) 10 MG tablet Take 1 tablet (10 mg total) by mouth daily.   spironolactone (ALDACTONE) 25 MG tablet Take 1 tablet (25 mg total) by mouth daily.   empagliflozin (JARDIANCE) 10 MG TABS tablet Take 1 tablet (10 mg total) by mouth daily before breakfast. For CHF (Patient not taking: Reported on 12/05/2022)   No facility-administered encounter medications on file as of 12/05/2022.    Allergies (verified) Amlodipine, Aspirin, Clonidine derivatives, Darvon [propoxyphene hcl], Lisinopril, Toprol xl [metoprolol], and Valium [diazepam]   History: Past Medical History:  Diagnosis Date   Anxiety    Atypical chest pain 03/28/2020   CKD (chronic kidney disease) stage 3, GFR 30-59 ml/min (HCC) 11/01/2019   Hypertension    Iron (Fe) deficiency anemia    Overweight 06/22/2021   Renal artery stenosis (HCC) 05/14/2021   Past Surgical History:  Procedure Laterality Date   CESAREAN SECTION     Family History  Problem Relation Age of Onset   CVA Mother    Hypertension Mother    Hypertension Sister    CVA Sister    Hypertension Brother    Hypertension Maternal Grandmother    Kidney failure Neg Hx  Social History   Socioeconomic History   Marital status: Married    Spouse name: Not on file   Number of children: Not on file   Years of education: Not on file   Highest education level: Not on file  Occupational History   Occupation: retired  Tobacco Use   Smoking status: Never   Smokeless tobacco: Never  Substance and Sexual Activity   Alcohol use: Not Currently    Comment: rare   Drug use: Never   Sexual activity: Not on file  Other Topics Concern   Not on file  Social History Narrative   Not on file   Social Determinants of Health    Financial Resource Strain: Low Risk  (12/05/2022)   Overall Financial Resource Strain (CARDIA)    Difficulty of Paying Living Expenses: Not hard at all  Food Insecurity: No Food Insecurity (12/05/2022)   Hunger Vital Sign    Worried About Running Out of Food in the Last Year: Never true    Ran Out of Food in the Last Year: Never true  Transportation Needs: No Transportation Needs (12/05/2022)   PRAPARE - Administrator, Civil Service (Medical): No    Lack of Transportation (Non-Medical): No  Physical Activity: Sufficiently Active (12/05/2022)   Exercise Vital Sign    Days of Exercise per Week: 4 days    Minutes of Exercise per Session: 40 min  Stress: No Stress Concern Present (12/05/2022)   [employer] of Occupational Health - Occupational Stress Questionnaire    Feeling of Stress : Not at all  Social Connections: Moderately Isolated (12/05/2022)   Social Connection and Isolation Panel [NHANES]    Frequency of Communication with Friends and Family: Three times a week    Frequency of Social Gatherings with Friends and Family: Never    Attends Religious Services: Never    Database administrator or Organizations: No    Attends Engineer, structural: Never    Marital Status: Married    Tobacco Counseling Counseling given: Not Answered   Clinical Intake:  Pre-visit preparation completed: No  Pain : No/denies pain     Nutritional Risks: None Diabetes: No  How often do you need to have someone help you when you read instructions, pamphlets, or other written materials from your doctor or pharmacy?: 1 - Never  Interpreter Needed?: No      Activities of Daily Living    12/05/2022    9:58 AM  In your present state of health, do you have any difficulty performing the following activities:  Hearing? 0  Vision? 0  Difficulty concentrating or making decisions? 1  Walking or climbing stairs? 1  Dressing or bathing? 0  Doing errands, shopping? 0  Preparing  Food and eating ? N  Using the Toilet? N  In the past six months, have you accidently leaked urine? N  Do you have problems with loss of bowel control? N  Managing your Medications? N  Managing your Finances? N  Housekeeping or managing your Housekeeping? N    Patient Care Team: Hoy Register, MD as PCP - General (Family Medicine) Parke Poisson, MD as PCP - Cardiology (Cardiology)  Indicate any recent Medical Services you may have received from other than [facility] providers in the past year (date may be approximate).     Assessment:   This is a routine wellness examination for Decarla.  Hearing/Vision screen No results found.  Dietary issues and exercise activities discussed:  Goals Addressed   None    Depression Screen    12/05/2022   10:05 AM 11/14/2022    3:50 PM 12/11/2019   10:28 AM 11/11/2019    9:01 AM 10/10/2019    9:29 AM  PHQ 2/9 Scores  PHQ - 2 Score 0 0 0  0  PHQ- 9 Score  10 6  6   Exception Documentation    Patient refusal     Fall Risk    12/05/2022   10:04 AM 11/14/2022    3:50 PM 09/12/2022    2:03 PM 12/11/2019   10:27 AM 11/11/2019    8:58 AM  Fall Risk   Falls in the past year? 1 0 0 0 0  Number falls in past yr: 0 0 0    Injury with Fall? 0 0 0    Risk for fall due to : No Fall Risks No Fall Risks  No Fall Risks No Fall Risks    MEDICARE RISK AT HOME:  Medicare Risk at Home - 12/05/22 1004     Any stairs in or around the home? Yes    If so, are there any without handrails? Yes    Home free of loose throw rugs in walkways, pet beds, electrical cords, etc? Yes    Adequate lighting in your home to reduce risk of falls? Yes    Life alert? No    Use of a cane, walker or w/c? No    Grab bars in the bathroom? Yes    Shower chair or bench in shower? No    Elevated toilet seat or a handicapped toilet? Yes             TIMED UP AND GO:  Was the test performed?  Yes  Length of time to ambulate 10 feet: 6 sec Gait steady and fast without  use of assistive device    Cognitive Function:        12/05/2022   10:06 AM  6CIT Screen  What Year? 0 points  What month? 0 points  What time? 0 points  Count back from 20 0 points  Months in reverse 0 points  Repeat phrase 0 points  Total Score 0 points    Immunizations  There is no immunization history on file for this patient. Qualifies for Shingles Vaccine? Yes   Zostavax completed No   Shingrix Completed?: No.    Education has been provided regarding the importance of this vaccine. Patient has been advised to call insurance company to determine out of pocket expense if they have not yet received this vaccine. Advised may also receive vaccine at local pharmacy or Health Dept. Verbalized acceptance and understanding.  Screening Tests Health Maintenance  Topic Date Due   Medicare Annual Wellness (AWV)  Never done   Colonoscopy  Never done   MAMMOGRAM  Never done   DEXA SCAN  Never done   COVID-19 Vaccine (1 - 2023-24 season) Never done   INFLUENZA VACCINE  12/01/2022   Zoster Vaccines- Shingrix (1 of 2) 03/07/2023 (Originally 02/17/2005)   Pneumonia Vaccine 24+ Years old (1 of 1 - PCV) 12/05/2023 (Originally 02/18/2020)   Hepatitis C Screening  Completed   HPV VACCINES  Aged Out   DTaP/Tdap/Td  Discontinued    Health Maintenance  Health Maintenance Due  Topic Date Due   Medicare Annual Wellness (AWV)  Never done   Colonoscopy  Never done   MAMMOGRAM  Never done   DEXA SCAN  Never done  COVID-19 Vaccine (1 - 2023-24 season) Never done   INFLUENZA VACCINE  12/01/2022    Colorectal cancer screening: Referral to GI placed today. Pt aware the office will call re: appt.  Mammogram status: Ordered today. Pt provided with contact info and advised to call to schedule appt.   Bone Density status: Ordered today. Pt provided with contact info and advised to call to schedule appt.  Lung Cancer Screening: (Low Dose CT Chest recommended if Age 67-80 years, 20 pack-year  currently smoking OR have quit w/in 15years.) does not qualify.   Lung Cancer Screening Referral: Not applicable  Additional Screening:  Hepatitis C Screening: does qualify; Completed on 11/29/22  Vision Screening: Recommended annual ophthalmology exams for early detection of glaucoma and other disorders of the eye. Is the patient up to date with their annual eye exam?  Yes  Who is the provider or what is the name of the office in which the patient attends annual eye exams? Unknown If pt is not established with a provider, would they like to be referred to a provider to establish care? No .   Dental Screening: Recommended annual dental exams for proper oral hygiene    Community Resource Referral / Chronic Care Management: CRR required this visit?  No   CCM required this visit?  No     Plan:    1. Encounter for Medicare annual wellness exam Counseled on 150 minutes of exercise per week, healthy eating (including decreased daily intake of saturated fats, cholesterol, added sugars, sodium), STI prevention, routine healthcare maintenance.   2. Encounter for screening mammogram for malignant neoplasm of breast - MM 3D SCREENING MAMMOGRAM BILATERAL BREAST; Future  3. Screening for colon cancer - Ambulatory referral to Gastroenterology  4. Estrogen deficiency - DG Bone Density; Future   I have personally reviewed and noted the following in the patient's chart:   Medical and social history Use of alcohol, tobacco or illicit drugs  Current medications and supplements including opioid prescriptions. Patient is not currently taking opioid prescriptions. Functional ability and status Nutritional status Physical activity Advanced directives List of other physicians Hospitalizations, surgeries, and ER visits in previous 12 months Vitals Screenings to include cognitive, depression, and falls Referrals and appointments  In addition, I have reviewed and discussed with patient  certain preventive protocols, quality metrics, and best practice recommendations. A written personalized care plan for preventive services as well as general preventive health recommendations were provided to patient.     Hoy Register, MD   12/05/2022

## 2022-12-05 NOTE — Patient Instructions (Signed)
  Ms. Dragone , Thank you for taking time to come for your Medicare Wellness Visit. I appreciate your ongoing commitment to your health goals. Please review the following plan we discussed and let me know if I can assist you in the future.   These are the goals we discussed:  Goals      Blood Pressure < 130/80        This is a list of the screening recommended for you and due dates:  Health Maintenance  Topic Date Due   Colon Cancer Screening  Never done   Mammogram  Never done   DEXA scan (bone density measurement)  Never done   COVID-19 Vaccine (1 - 2023-24 season) Never done   Flu Shot  12/01/2022   Zoster (Shingles) Vaccine (1 of 2) 03/07/2023*   Pneumonia Vaccine (1 of 1 - PCV) 12/05/2023*   Medicare Annual Wellness Visit  12/05/2023   Hepatitis C Screening  Completed   HPV Vaccine  Aged Out   DTaP/Tdap/Td vaccine  Discontinued  *Topic was postponed. The date shown is not the original due date.

## 2022-12-06 ENCOUNTER — Ambulatory Visit: Payer: Medicare Other | Admitting: Student

## 2022-12-06 ENCOUNTER — Other Ambulatory Visit: Payer: Self-pay

## 2022-12-07 ENCOUNTER — Other Ambulatory Visit: Payer: Self-pay

## 2022-12-16 ENCOUNTER — Other Ambulatory Visit: Payer: Self-pay

## 2022-12-21 ENCOUNTER — Other Ambulatory Visit: Payer: Self-pay

## 2022-12-23 ENCOUNTER — Other Ambulatory Visit: Payer: Self-pay

## 2022-12-26 NOTE — Progress Notes (Unsigned)
[facility] [place]  ASSESSMENT / PLAN: 66 y.o. female with bilateral renal artery stenosis not causing renovascular hypertension or other *** - ***  CHIEF COMPLAINT: ***  HISTORY OF PRESENT ILLNESS: April [name] is a 67 y.o. female ***  VASCULAR SURGICAL HISTORY: ***  VASCULAR RISK FACTORS: {FINDINGS; POSITIVE [phone]} history of stroke / transient ischemic attack. {FINDINGS; POSITIVE [phone]} history of coronary artery disease. *** history of PCI. *** history of CABG.  {FINDINGS; POSITIVE [phone]} history of diabetes mellitus. Last A1c ***. {FINDINGS; POSITIVE [phone]} history of smoking. *** actively smoking. {FINDINGS; POSITIVE [phone]} history of hypertension. *** drug regimen with *** control. {FINDINGS; POSITIVE [phone]} history of chronic kidney disease.  Last GFR ***. CKD {stage:30421363}. {FINDINGS; POSITIVE [phone]} history of chronic obstructive pulmonary disease, treated with ***.  FUNCTIONAL STATUS: ECOG performance status: {findings; ecog performance status:31780} Ambulatory status: {TNHAmbulation:25868}  CAREY 1 AND 3 YEAR INDEX Female (2pts) 75-79 or 80-84 (2pts) >84 (3pts) Dependence in toileting (1pt) Partial or full dependence in dressing (1pt) History of malignant neoplasm (2pts) CHF (3pts) COPD (1pts) CKD (3pts)  0-3 pts 6% 1 year mortality ; 21% 3 year mortality 4-5 pts 12% 1 year mortality ; 36% 3 year mortality >5 pts 21% 1 year mortality; 54% 3 year mortality   Past Medical History:  Diagnosis Date   Anxiety    Atypical chest pain 03/28/2020   CKD (chronic kidney disease) stage 3, GFR 30-59 ml/min (HCC) 11/01/2019   Hypertension    Iron (Fe) deficiency anemia    Overweight 06/22/2021   Renal artery stenosis (HCC) 05/14/2021    Past Surgical History:  Procedure Laterality Date   CESAREAN SECTION      Family History  Problem  Relation Age of Onset   CVA Mother    Hypertension Mother    Hypertension Sister    CVA Sister    Hypertension Brother    Hypertension Maternal Grandmother    Kidney failure Neg Hx     Social History   Socioeconomic History   Marital status: Married    Spouse name: Not on file   Number of children: Not on file   Years of education: Not on file   Highest education level: Not on file  Occupational History   Occupation: retired  Tobacco Use   Smoking status: Never   Smokeless tobacco: Never  Substance and Sexual Activity   Alcohol use: Not Currently    Comment: rare   Drug use: Never   Sexual activity: Not on file  Other Topics Concern   Not on file  Social History Narrative   Not on file   Social Determinants of Health   Financial Resource Strain: Low Risk  (12/05/2022)   Overall Financial Resource Strain (CARDIA)    Difficulty of Paying Living Expenses: Not hard at all  Food Insecurity: No Food Insecurity (12/05/2022)   Hunger Vital Sign    Worried About Running Out of Food in the Last Year: Never true    Ran Out of Food in the Last Year: Never true  Transportation Needs: No Transportation Needs (12/05/2022)   PRAPARE - Administrator, Civil Service (Medical): No    Lack of Transportation (Non-Medical): No  Physical Activity: Sufficiently Active (12/05/2022)   Exercise Vital Sign    Days of Exercise per Week: 4 days    Minutes of Exercise per Session: 40 min  Stress: No Stress Concern Present (12/05/2022)   [employer] of Occupational Health -  Occupational Stress Questionnaire    Feeling of Stress : Not at all  Social Connections: Moderately Isolated (12/05/2022)   Social Connection and Isolation Panel [NHANES]    Frequency of Communication with Friends and Family: Three times a week    Frequency of Social Gatherings with Friends and Family: Never    Attends Religious Services: Never    Database administrator or Organizations: No    Attends Tax inspector Meetings: Never    Marital Status: Married  Catering manager Violence: Not At Risk (12/05/2022)   Humiliation, Afraid, Rape, and Kick questionnaire    Fear of Current or Ex-Partner: No    Emotionally Abused: No    Physically Abused: No    Sexually Abused: No    Allergies  Allergen Reactions   Amlodipine Other (See Comments)    "Dental issues"    Aspirin Hives   Clonidine Derivatives Other (See Comments)    Unknown reaction   Darvon [Propoxyphene Hcl] Other (See Comments)    Dizziness   Lisinopril Other (See Comments)    Gout    Toprol Xl [Metoprolol] Other (See Comments)    Nightmares    Valium [Diazepam] Other (See Comments)    Dizziness    Current Outpatient Medications  Medication Sig Dispense Refill   empagliflozin (JARDIANCE) 10 MG TABS tablet Take 1 tablet (10 mg total) by mouth daily before breakfast. For CHF (Patient not taking: Reported on 12/05/2022) 90 tablet 1   hydrALAZINE (APRESOLINE) 25 MG tablet Take 1 tablet (25 mg total) by mouth 3 (three) times daily. 90 tablet 3   irbesartan (AVAPRO) 150 MG tablet Take 1 tablet (150 mg total) by mouth 2 (two) times daily. 60 tablet 6   labetalol (NORMODYNE) 200 MG tablet TAKE 2 TABLETS BY MOUTH THREE TIMES A DAY 180 tablet 5   rosuvastatin (CRESTOR) 10 MG tablet Take 1 tablet (10 mg total) by mouth daily. 90 tablet 3   spironolactone (ALDACTONE) 25 MG tablet Take 1 tablet (25 mg total) by mouth daily. 90 tablet 1   No current facility-administered medications for this visit.    PHYSICAL EXAM There were no vitals filed for this visit.  Constitutional: *** appearing. *** distress. Appears *** nourished.  Neurologic: CN ***. *** focal findings. *** sensory loss. Psychiatric: *** Mood and affect symmetric and appropriate. Eyes: *** No icterus. No conjunctival pallor. Ears, nose, throat: *** mucous membranes moist. Midline trachea.  Cardiac: *** rate and rhythm.  Respiratory: *** unlabored. Abdominal: ***  soft, non-tender, non-distended.  Peripheral vascular: *** Extremity: *** edema. *** cyanosis. *** pallor.  Skin: *** gangrene. *** ulceration.  Lymphatic: *** Stemmer's sign. *** palpable lymphadenopathy.    PERTINENT LABORATORY AND RADIOLOGIC DATA  Most recent CBC    Latest Ref Rng & Units 06/22/2021   12:38 PM 05/13/2021    6:48 AM 05/13/2021    6:41 AM  CBC  WBC 4.0 - 10.5 K/uL 7.2   4.9   Hemoglobin 12.0 - 15.0 g/dL 27.2  53.6  64.4   Hematocrit 36.0 - 46.0 % 39.6  43.0  41.1   Platelets 150 - 400 K/uL 247   229      Most recent CMP    Latest Ref Rng & Units 11/29/2022    4:00 PM 09/12/2022    2:33 PM 07/02/2021    3:06 PM  CMP  Glucose 70 - 99 mg/dL 93  79  94   BUN 8 - 27 mg/dL 16  12  12   Creatinine 0.57 - 1.00 mg/dL 3.76  2.83  1.51   Sodium 134 - 144 mmol/L 140  143  146   Potassium 3.5 - 5.2 mmol/L 4.2  4.0  4.8   Chloride 96 - 106 mmol/L 108  107  109   CO2 20 - 29 mmol/L 20  23  23    Calcium 8.7 - 10.3 mg/dL 9.7  9.7  9.9   Total Protein 6.0 - 8.5 g/dL 6.8  7.2    Total Bilirubin 0.0 - 1.2 mg/dL 0.5  0.6    Alkaline Phos 44 - 121 IU/L 109  110    AST 0 - 40 IU/L 16  24    ALT 0 - 32 IU/L 11  17      Renal function CrCl cannot be calculated (Patient's most recent lab result is older than the maximum 21 days allowed.).  Hgb A1c MFr Bld (%)  Date Value  09/12/2022 4.9    LDL Chol Calc (NIH)  Date Value Ref Range Status  05/20/2021 96 0 - 99 mg/dL Final     Vascular Imaging: ***  April [name] N. Lenell Antu, MD [facility] Phone Number: [phone] 12/26/2022 10:32 AM   Total time spent on preparing this encounter including chart review, data review, collecting history, examining the patient, coordinating care for this {tnhtimebilling:26202}  Portions of this report may have been transcribed using voice recognition software.  Every effort has been made to ensure accuracy; however, inadvertent computerized  transcription errors may still be present.

## 2022-12-27 ENCOUNTER — Encounter: Payer: Self-pay | Admitting: Vascular Surgery

## 2022-12-27 ENCOUNTER — Ambulatory Visit: Payer: Medicare Other | Admitting: Vascular Surgery

## 2022-12-27 ENCOUNTER — Ambulatory Visit (HOSPITAL_COMMUNITY)
Admission: RE | Admit: 2022-12-27 | Discharge: 2022-12-27 | Disposition: A | Discharge status: Home / Self Care | Payer: Medicare Other | Source: Ambulatory Visit | Attending: Vascular Surgery | Admitting: Vascular Surgery

## 2022-12-27 VITALS — BP 107/65 | HR 79 | Temp 98.1°F | Resp 20 | Ht 64.0 in | Wt 186.0 lb

## 2022-12-27 DIAGNOSIS — I701 Atherosclerosis of renal artery: Secondary | ICD-10-CM | POA: Insufficient documentation

## 2023-01-06 ENCOUNTER — Other Ambulatory Visit: Payer: Self-pay | Admitting: Physician Assistant

## 2023-01-06 ENCOUNTER — Telehealth: Payer: Self-pay | Admitting: Family Medicine

## 2023-01-06 DIAGNOSIS — I160 Hypertensive urgency: Secondary | ICD-10-CM

## 2023-01-06 NOTE — Telephone Encounter (Signed)
Already requested today from the pharmacy in a separate refill encounter, pending approval.

## 2023-01-06 NOTE — Telephone Encounter (Signed)
Medication Refill - Medication: spironolactone (ALDACTONE) 25 MG tablet   Has the patient contacted their pharmacy? Yes.     Preferred Pharmacy (with phone number or street name):  [facility] [phone] [place], [place] - [address] Phone: [phone]  Fax: [phone]      Has the patient been seen for an appointment in the last year OR does the patient have an upcoming appointment? Yes.    Please assist patient further

## 2023-01-15 ENCOUNTER — Other Ambulatory Visit: Payer: Self-pay | Admitting: Physician Assistant

## 2023-01-15 DIAGNOSIS — I160 Hypertensive urgency: Secondary | ICD-10-CM

## 2023-01-16 NOTE — Telephone Encounter (Signed)
Requested medication (s) are due for refill today - yes  Requested medication (s) are on the active medication list -yes  Future visit scheduled -yes  Last refill: 09/12/22 #90 3RF  Notes to clinic: fails lab protocol- over 1 year 06/22/21  Requested Prescriptions  Pending Prescriptions Disp Refills   hydrALAZINE (APRESOLINE) 25 MG tablet [Pharmacy Med Name: HYDRALAZINE 25 MG TABLET] 90 tablet 3    Sig: TAKE 1 TABLET BY MOUTH THREE TIMES A DAY     Cardiovascular:  Vasodilators Failed - 01/15/2023  1:21 AM      Failed - HCT in normal range and within 360 days    HCT  Date Value Ref Range Status  06/22/2021 39.6 36.0 - 46.0 % Final         Failed - HGB in normal range and within 360 days    Hemoglobin  Date Value Ref Range Status  06/22/2021 13.5 12.0 - 15.0 g/dL Final         Failed - RBC in normal range and within 360 days    RBC  Date Value Ref Range Status  06/22/2021 4.53 3.87 - 5.11 MIL/uL Final         Failed - WBC in normal range and within 360 days    WBC  Date Value Ref Range Status  06/22/2021 7.2 4.0 - 10.5 K/uL Final         Failed - PLT in normal range and within 360 days    Platelets  Date Value Ref Range Status  06/22/2021 247 150 - 400 K/uL Final         Failed - ANA Screen, Ifa, Serum in normal range and within 360 days    No results found for: "ANA", "ANATITER", "LABANTI"       Passed - Last BP in normal range    BP Readings from Last 1 Encounters:  12/27/22 107/65         Passed - Valid encounter within last 12 months    Recent Outpatient Visits           1 month ago Encounter for [employer] annual wellness exam   [facility] [place], [place], [place]   2 months ago Gout, unspecified cause, unspecified chronicity, unspecified site   [facility] Hoy Register, MD   4 months ago Palpitations   [place] [place], [place], [place]   3 years ago  Annual physical exam   [facility] [place], [place], [place]   3 years ago Chronic diastolic CHF (congestive heart failure) [facility])   [place] [facility] [facility] Hoy Register, MD       Future Appointments             In 2 weeks Corrin Parker, PA-C [place] [facility]   In 4 weeks Hoy Register, MD [employer] [facility]               Requested Prescriptions  Pending Prescriptions Disp Refills   hydrALAZINE (APRESOLINE) 25 MG tablet [Pharmacy Med Name: HYDRALAZINE 25 MG TABLET] 90 tablet 3    Sig: TAKE 1 TABLET BY MOUTH THREE TIMES A DAY     Cardiovascular:  Vasodilators Failed - 01/15/2023  1:21 AM      Failed - HCT in normal range and within 360 days    HCT  Date Value Ref Range Status  06/22/2021 39.6 36.0 - 46.0 % Final         Failed - HGB in normal range and within 360 days    Hemoglobin  Date Value Ref Range Status  06/22/2021 13.5 12.0 - 15.0 g/dL Final         Failed - RBC in normal range and within 360 days    RBC  Date Value Ref Range Status  06/22/2021 4.53 3.87 - 5.11 MIL/uL Final         Failed - WBC in normal range and within 360 days    WBC  Date Value Ref Range Status  06/22/2021 7.2 4.0 - 10.5 K/uL Final         Failed - PLT in normal range and within 360 days    Platelets  Date Value Ref Range Status  06/22/2021 247 150 - 400 K/uL Final         Failed - ANA Screen, Ifa, Serum in normal range and within 360 days    No results found for: "ANA", "ANATITER", "LABANTI"       Passed - Last BP in normal range    BP Readings from Last 1 Encounters:  12/27/22 107/65         Passed - Valid encounter within last 12 months    Recent Outpatient Visits           1 month ago Encounter for [employer] annual wellness exam   [facility] [place], [place], [place]   2 months ago Gout, unspecified cause, unspecified  chronicity, unspecified site   [facility] [place], [place], [place]   4 months ago Palpitations   [facility] [place], [place], [place]   3 years ago Annual physical exam   [place] [facility] [place], [place]s, [place]   3 years ago Chronic diastolic CHF (congestive heart failure) [facility])   [place] [facility] Hoy Register, MD       Future Appointments             In 2 weeks Corrin Parker, PA-C  [facility] at [facility]   In 4 weeks Hoy Register, MD [facility]

## 2023-01-17 ENCOUNTER — Ambulatory Visit: Payer: Medicare Other | Admitting: Family Medicine

## 2023-01-18 NOTE — Progress Notes (Deleted)
Cardiology Office Note:    Date:  01/18/2023   ID:  April [name], DOB 01-05-1956, MRN [id number]  PCP:  Hoy Register, MD  Cardiologist:  Parke Poisson, MD  Electrophysiologist:  None   Referring MD: Hoy Register, MD   Chief Complaint: follow-up of palpitations   History of Present Illness:    April [name] is a 67 y.o. female with a history of  chronic HFpEF, palpitations with rare PACs/ PVCs noted on monitor in 08/2022, renal artery stenosis, resistant hypertension, hyperlipidemia, CKD stage III, iron deficiency anemia, and anxiety who is followed by Dr. [name] and presents today for follow-up of palpitations.   Patient was first seen by Dr. [name] in 10/2019 after an ED visit for hypertensive urgency. Echo in 08/2019 prior to visit showed normal LV function and grade 1 diastolic dysfunction. She was ultimately referred to Dr. [name] and underwent work-up for secondary hypertension. AM cortisol, renin/ aldosterone levels, and catecholamines/ metanephrine level were normal. Renal artery dopplers showed 1-59% stenosis on the right and were normal on the left. Most recent dopplers in 05/2021 showed 1-59% of bilateral renal arteries. She was last seen by Dr. [name] in 06/2021 at which time she continued to report occasional spikes in her BP with increased palpitations during the episodes. She ended up in the ED later that same day after her systolic BP spiked to the 200s and she had associated numbness in her left chin. Head CT showed no acute findings. Brain MRI was also negative for acute infarct but showed mild chronic microvascular ischemic changes. She was subsequently seen in our Pharm[facility] Clinic in 08/2021 who adjusted her medications further. She was instructed to follow-up in 1 month but was lost to follow-up until 08/2022 when she was seen by Azalee Course, PA-C for palpitations. At that visit, she reported palpitations in her right chest occurring 2-3 times per week with associated  mild lightheadedness but no syncope. A 2 week Zio monitor was ordered for further evaluation. ***  Patient presents today for follow-up. ***  Palpitations Patient has a history of palpitations. Monitor was ordered at visit in 08/2022 but *** - *** - Continue Labetalol 400mg  three times daily.   Chronic HFpEF Echo in 08/2019 showed LVEF of 70-75% with normal wall motion and grade 1 diastolic dysfunction, normal RV seize and function, and no significant valvular disease.  - Euvolemic on exam.  - Continue Irbesartan 150mg  twice daily. - Continue Labetalol 400mg  three times daily.  - Continue Spironolactone 25mg  daily.  - Continue Jardiance 10mg  daily.   Resistant Hypertension History of resistant hypertension. Work-up for secondary hypertension has been unremarkable. AM cortisol, renin/ aldosterone levels, and catecholamines/ metanephrine level were normal. Renal artery dopplers showed 1-59% stenosis on of bilateral renal arteries.  - BP *** - Continue current medications: Irbesartan 150mg  twice daily, Labetalol 400mg  three times daily, Spironolactone 25mg  daily, and Hydralazine 25mg  three times daily.   Hyperlipidemia Lipid panel in 05/2021: Total Cholesterol 159, Triglycerides 95, HDL 45, LDL 96. LDL goal <70 given renal artery stenosis.  - Currently on Crestor 10mg  daily. Will increase to 20mg  daily. *** - Will repeat lipid panel and LFTs in 6-8 weeks. ***  Renal Artery Stenosis Last renal artery dopplers in 12/2022 showed 1-59% stenosis of bilateral renal arteries. Unchanged from imaging in 05/2021. Not felt to be the cause of her hypertension. - Intolerant to Aspirin (causes hives). - Continue statin. - Followed by Vascular Surgery.    EKGs/Labs/Other Studies Reviewed:  The following studies were reviewed:  Echocardiogram 09/29/2019: Impressions:  1. Hyperdynamic LV systolic function; grade 1 diastolic dysfunction.   2. Left ventricular ejection fraction, by estimation, is 70  to 75%. The  left ventricle has hyperdynamic function. The left ventricle has no  regional wall motion abnormalities. Left ventricular diastolic parameters  are consistent with Grade I diastolic  dysfunction (impaired relaxation).   3. Right ventricular systolic function is normal. The right ventricular  size is normal. There is normal pulmonary artery systolic pressure.   4. The mitral valve is normal in structure. Trivial mitral valve  regurgitation. No evidence of mitral stenosis.   5. The aortic valve is tricuspid. Aortic valve regurgitation is not  visualized. No aortic stenosis is present.   6. The inferior vena cava is normal in size with greater than 50%  respiratory variability, suggesting right atrial pressure of 3 mmHg.  _______________  Renal Artery Dopplers 12/27/2022: Summary:  Renal: - Right: Normal size right kidney. Abnormal right Resistive Index. 1-59% stenosis of the right renal artery. RRV flow present.  - Left:  Normal size of left kidney. Normal left Resistive Index. 1-59% stenosis of the left renal artery. LRV flow present.   Mesenteric:  -Normal Celiac artery and Superior Mesenteric artery findings.   EKG:  EKG not ordered today.   Recent Labs: 11/29/2022: ALT 11; BUN 16; Creatinine, Ser 1.11; Potassium 4.2; Sodium 140  Recent Lipid Panel    Component Value Date/Time   CHOL 159 05/20/2021 0800   TRIG 95 05/20/2021 0800   HDL 45 05/20/2021 0800   CHOLHDL 3.5 05/20/2021 0800   LDLCALC 96 05/20/2021 0800    Physical Exam:    Vital Signs: There were no vitals taken for this visit.    Wt Readings from Last 3 Encounters:  12/27/22 186 lb (84.4 kg)  12/05/22 189 lb 9.6 oz (86 kg)  11/14/22 187 lb 6.4 oz (85 kg)     General: 67 y.o. female in no acute distress. HEENT: Normocephalic and atraumatic. Sclera clear. EOMs intact. Neck: Supple. No carotid bruits. No JVD. Heart: *** RRR. Distinct S1 and S2. No murmurs, gallops, or rubs. Radial and distal pedal  pulses 2+ and equal bilaterally. Lungs: No increased work of breathing. Clear to ausculation bilaterally. No wheezes, rhonchi, or rales.  Abdomen: Soft, non-distended, and non-tender to palpation. Bowel sounds present in all 4 quadrants.  MSK: Normal strength and tone for age. *** Extremities: No lower extremity edema.    Skin: Warm and dry. Neuro: Alert and oriented x3. No focal deficits. Psych: Normal affect. Responds appropriately.   Assessment:    No diagnosis found.  Plan:     Disposition: Follow up in ***   Medication Adjustments/Labs and Tests Ordered: Current medicines are reviewed at length with the patient today.  Concerns regarding medicines are outlined above.  No orders of the defined types were placed in this encounter.  No orders of the defined types were placed in this encounter.   There are no Patient Instructions on file for this visit.   Signed, Corrin Parker, PA-C  01/18/2023 11:01 AM    [facility]

## 2023-01-31 ENCOUNTER — Ambulatory Visit: Payer: Medicare Other | Admitting: Student

## 2023-02-02 ENCOUNTER — Other Ambulatory Visit: Payer: Self-pay

## 2023-02-06 ENCOUNTER — Encounter: Payer: Medicare Other | Attending: Family Medicine | Admitting: Dietician

## 2023-02-06 VITALS — Wt 186.0 lb

## 2023-02-06 DIAGNOSIS — N1831 Chronic kidney disease, stage 3a: Secondary | ICD-10-CM | POA: Diagnosis not present

## 2023-02-06 NOTE — Patient Instructions (Addendum)
Continue a low sodium diet Avoid foods with added phos... Stay hydrated - around 64 oz daily Consider getting your vitamin D checked Eat more plant based meals, limit red meat and meat portion sizes   Great job on all the changes that you have made and all of your diligent research! Please call for any questions.

## 2023-02-06 NOTE — Progress Notes (Addendum)
Medical Nutrition Therapy  Appointment Start time:  1050  Appointment End time:  1210  Primary concerns today: She states that she has resistant HTN, gout, CKD, CHF, arthritis.  She has brought her food journals and books that she has referenced. She states that she has followed heart healthy/DASH diet and is confused about the differing recommendations.  She is also concerned about nitrates and nitrites. She has read Dr. Lenard Forth book, low sodium recipe book, DASH diet book, Sugar impact diet book and many others that she has read.  Journals are very detailed including facts on magnesium, etc.  If she reads it, she writes it down. She wants personalized information for her status.  She reports Blood pressure 280/? In 2022. Referral diagnosis: CHF, CKD.  Preferred learning style: no preference indicated Learning readiness: ready, change in progress   NUTRITION ASSESSMENT  64" 186 lbs [id number]  Clinical Medical Hx: resistant HTN, CHF, CKD, arthritis, gout, CVA, renal artery stenosis Medications: Jardiance (not taking), spironolactone Labs: Labs noted to include:  BUN 16, Creatinine 1.11, Potassium 4, eGFR 54 on 11/29/2022, A1C 4.9% 09/12/2022 Notable Signs/Symptoms: lactose intolerant  Lifestyle & Dietary Hx Patient lives with her husband.  She does the shopping and cooking.  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 Her husband has a debilitating rare muscle disorder.  He needs increased help to leave the house and she cannot leave him for any length of time due to his fall risk.  She is his only caregiver.   Follows a low sodium diet. Estimated daily fluid intake: 48 oz water, OJ, hot or iced decaf tea with splenda Supplements: none Sleep: She does not sleep well.  Falls asleep but wakes early.  She does take naps. 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  Doing every day tasks is a challenge.  Watches videos of physical therapists  24-Hr Dietary Recall First Meal: plain cheerios, 1% milk OR yogurt parfait OR smoothie (lactose free or almond milk, greek yogurt, frozen fruit/fresh fruit, 1/2 banana, chia seeds, ice) Snack: fruit Second Meal: 80/20 hamburger patty with green peas, cubed boiled potatoes with parsley, Mrs. Dash, black pepper Snack: walnuts and fruit Third Meal: salmon, green beans Snack: nuts or 1/2 PB sandwich with medication Beverages: water, lactose free milk, decaf tea with splenda  Estimated Energy Needs Calories: 1500 Protein: 50-60 g  NUTRITION DIAGNOSIS  NB-1.1 Food and nutrition-related knowledge deficit As related to [facility], CHF and nutrition related to multiple health issues.  As evidenced by patient report.   NUTRITION INTERVENTION  Nutrition education (E-1) on the following topics:  Discussed hydration, her fluid intake, variables that affect adequacy Discussed Vitamin D - no recent labs.  She notes previous vitamin D deficiency.  Suggested getting this rechecked.  Discussed her question on intermittent fasting.  She takes a medication q 12 hours and needs to eat with this.  Discussed that strict fasting would not be appropriate for her.  Fasting 12 hours is acceptable. Low sodium diet tips - eating out.  She is following a strict low sodium diet except when out. Low phosphorous with recommendations to avoid those products with added phos... in the ingredient list Protein recommendations for CKD.  Limit red meat.  Eat more plant based meals without meat.   Handouts Provided Include  DASH diet and Making the Move towards  DASH NKD national kidney diet - Dish up a Kidney-Friendly Meal for Patients with Chronic Kidney Disease (not on dialysis)  Learning Style & Readiness for Change Teaching method utilized: Visual & Auditory  Demonstrated degree of understanding via: Teach Back  Barriers to  learning/adherence to lifestyle change: none  Goals Established by Pt Continue a low sodium diet Avoid foods with added phos... Stay hydrated  - around 64 oz fluid daily Consider getting your vitamin D checked Eat more plant based meals, limit red meat and meat portion size  MONITORING & EVALUATION Dietary intake, weekly physical activity prn  Next Steps  Patient is to call for questions.

## 2023-02-14 ENCOUNTER — Encounter: Payer: Self-pay | Admitting: Family Medicine

## 2023-02-14 ENCOUNTER — Ambulatory Visit: Payer: Medicare Other | Attending: Family Medicine | Admitting: Family Medicine

## 2023-02-14 VITALS — BP 115/62 | HR 75 | Ht 64.0 in | Wt 185.6 lb

## 2023-02-14 DIAGNOSIS — N1831 Chronic kidney disease, stage 3a: Secondary | ICD-10-CM | POA: Diagnosis not present

## 2023-02-14 DIAGNOSIS — I10 Essential (primary) hypertension: Secondary | ICD-10-CM

## 2023-02-14 DIAGNOSIS — I110 Hypertensive heart disease with heart failure: Secondary | ICD-10-CM

## 2023-02-14 DIAGNOSIS — M109 Gout, unspecified: Secondary | ICD-10-CM | POA: Diagnosis not present

## 2023-02-14 DIAGNOSIS — R002 Palpitations: Secondary | ICD-10-CM | POA: Diagnosis not present

## 2023-02-14 MED ORDER — HYDRALAZINE HCL 25 MG PO TABS
25.0000 mg | ORAL_TABLET | Freq: Three times a day (TID) | ORAL | 1 refills | Status: DC
Start: 2023-02-14 — End: 2023-05-23

## 2023-02-14 MED ORDER — LABETALOL HCL 200 MG PO TABS
ORAL_TABLET | ORAL | 5 refills | Status: DC
Start: 2023-02-14 — End: 2023-05-23

## 2023-02-14 MED ORDER — SPIRONOLACTONE 25 MG PO TABS
25.0000 mg | ORAL_TABLET | Freq: Every day | ORAL | 1 refills | Status: DC
Start: 2023-02-14 — End: 2023-05-23

## 2023-02-14 NOTE — Patient Instructions (Signed)
Heart Failure, Self-Care Heart failure is a serious condition. The following information explains the things you need to do to take care of yourself after a heart failure diagnosis. You may be asked to change your diet, take certain medicines, and make other lifestyle changes in order to stay as healthy as possible. Your health care provider may also give you more specific instructions. If you have problems or questions, contact your health care provider. What are the risks? Having heart failure puts you at higher risk for certain problems. These problems can get worse if you do not take good care of yourself. Problems may include: Damage to the kidneys, liver, or lungs. Malnutrition. Abnormal heart rhythms. Blood clotting issues that could cause a stroke. Supplies needed: Scale for monitoring weight. Blood pressure monitor. Notebook. Medicines. How to care for yourself when you have heart failure Medicines Take over-the-counter and prescription medicines only as told by your health care provider. Medicines reduce the workload of your heart, slow the progression of heart failure, and improve symptoms. Take your medicines every day. Do not stop taking your medicine unless your health care provider tells you to do so. Do not skip any dose of medicine. Refill your prescriptions before you run out of medicine. Talk with your health care provider if you cannot afford your medicines. Eating and drinking  Eat heart-healthy foods. Talk with a dietitian to make an eating plan that is right for you. Limit salt (sodium) if told by your health care provider. Sodium restriction may reduce symptoms of heart failure. Ask a dietitian to recommend heart-healthy seasonings. Use healthy cooking methods instead of frying. Healthy methods include roasting, grilling, broiling, baking, poaching, steaming, and stir-frying. Choose foods that contain no trans fat and are low in saturated fat and cholesterol. Healthy  choices include fresh or frozen fruits and vegetables, fish, lean meats, legumes, fat-free or low-fat dairy products, and whole-grain or high-fiber foods. Limit your fluid intake, if directed by your health care provider. Fluid restriction may reduce symptoms of heart failure. Alcohol use Do not drink alcohol if: Your health care provider tells you not to drink. Your heart was damaged by alcohol, or you have severe heart failure. You are pregnant, may be pregnant, or are planning to become pregnant. If you drink alcohol: Limit how much you have to: 0-1 drink a day for women. 0-2 drinks a day for men. Know how much alcohol is in your drink. In the [place] one drink equals one 12 oz bottle of beer (355 mL), one 5 oz glass of wine (148 mL), or one 1 oz glass of hard liquor (44 mL). Lifestyle  Do not use any products that contain nicotine or tobacco. These products include cigarettes, chewing tobacco, and vaping devices, such as e-cigarettes. If you need help quitting, ask your health care provider. Do not use nicotine gum or patches before talking to your health care provider. Do not use illegal drugs. Work with your health care provider to safely reach the right body weight. Do physical activity if told by your health care provider. Talk to your health care provider before you begin an exercise if: You are an older adult. You have severe heart failure. Learn to manage stress. If you need help to do this, ask your health care provider. Participate in or seek physical rehabilitation as needed to keep or improve your independence and quality of life. Participate in a cardiac rehabilitation program, which is a treatment program to improve your health and well-being through  exercise training, education, and counseling. Plan rest periods when you get tired. Monitoring important information  Weigh yourself every day. This will help you to notice if too much fluid is building up in your body. Weigh  yourself every morning after you urinate and before you eat breakfast. Wear the same amount of clothing each time you weigh yourself. Record your daily weight. Provide your health care provider with your weight record. Monitor and record your pulse and blood pressure as told by your health care provider. Dealing with extreme temperatures If the weather is extremely hot: Avoid vigorous physical activity. Use air conditioning or fans, or find a cooler location. Avoid caffeine and alcohol. Wear loose-fitting, lightweight, and light-colored clothing. If the weather is extremely cold: Avoid vigorous activity. Layer your clothes. Wear mittens or gloves, a hat, and a face covering when you go outside. Avoid alcohol. Follow these instructions at home: Stay up to date with vaccines. Pneumococcal and flu (influenza) vaccines are especially important in preventing infections of the airways. Keep all follow-up visits. This is important. Contact a health care provider if you: Gain 2-3 lb (1-1.4 kg) in 24 hours or 5 lb (2.3 kg) in a week. Have increasing shortness of breath. Are unable to participate in your usual physical activities. Get tired easily. Cough more than normal, especially with physical activity. Lose your appetite or feel nauseous. Have any swelling or more swelling in areas such as your hands, feet, ankles, or abdomen. Are unable to sleep because it is hard to breathe. Feel like your heart is beating quickly (palpitations). Become dizzy or light-headed when you stand up. Have feelings of depression or sadness. Get help right away if you: Have trouble breathing. Notice, or your family notices, a change in your awareness, such as having trouble staying awake or concentrating. Have pain or discomfort in your chest. Have an episode of fainting (syncope). These symptoms may represent a serious problem that is an emergency. Do not wait to see if the symptoms will go away. Get medical  help right away. Call your local emergency services (911 in the [place] Do not drive yourself to the hospital. Summary Heart failure is a serious condition. To care for yourself, you may be asked to change your diet, take certain medicines, and make other lifestyle changes. Take your medicines every day. Do not stop taking them unless your health care provider tells you to do so. Limit salt and eat heart-healthy foods, such as fresh or frozen fruits and vegetables, fish, lean meats, legumes, fat-free or low-fat dairy products, and whole-grain or high-fiber foods. Ask your health care provider if you have any alcohol restrictions. You may have to stop drinking alcohol if you have severe heart failure. Contact your health care provider if you notice problems, such as rapid weight gain or a fast heartbeat. Get help right away if you faint or have chest pain or trouble breathing. This information is not intended to replace advice given to you by your health care provider. Make sure you discuss any questions you have with your health care provider. Document Revised: 07/27/2021 Document Reviewed: 11/09/2019 Elsevier Patient Education  2024 [employer].

## 2023-02-14 NOTE — Progress Notes (Unsigned)
Subjective:  Patient ID: April [name], female    DOB: 10-Nov-1955  Age: 67 y.o. MRN: [id number]  CC: Medical Management of Chronic Issues   HPI April [name] is a 67 y.o. year old female with a history of hypertension, hyperlipidemia, renal artery stenosis, HFpEF (EF 70-75 % from echo 2021), Gout, CKD stage 3.  Interval History: Discussed the use of AI scribe software for clinical note transcription with the patient, who gave verbal consent to proceed.  History of Present Illness            Past Medical History:  Diagnosis Date   Anxiety    Atypical chest pain 03/28/2020   CKD (chronic kidney disease) stage 3, GFR 30-59 ml/min (HCC) 11/01/2019   Hypertension    Iron (Fe) deficiency anemia    Overweight 06/22/2021   Renal artery stenosis (HCC) 05/14/2021    Past Surgical History:  Procedure Laterality Date   CESAREAN SECTION      Family History  Problem Relation Age of Onset   CVA Mother    Hypertension Mother    Hypertension Sister    CVA Sister    Hypertension Brother    Hypertension Maternal Grandmother    Kidney failure Neg Hx     Social History   Socioeconomic History   Marital status: Married    Spouse name: Not on file   Number of children: Not on file   Years of education: Not on file   Highest education level: Not on file  Occupational History   Occupation: retired  Tobacco Use   Smoking status: Never   Smokeless tobacco: Never  Substance and Sexual Activity   Alcohol use: Not Currently    Comment: rare   Drug use: Never   Sexual activity: Not on file  Other Topics Concern   Not on file  Social History Narrative   Not on file   Social Determinants of Health   Financial Resource Strain: Low Risk  (12/05/2022)   Overall Financial Resource Strain (CARDIA)    Difficulty of Paying Living Expenses: Not hard at all  Food Insecurity: No Food Insecurity (12/05/2022)   Hunger Vital Sign    Worried About Running Out of Food in the Last Year: Never  true    Ran Out of Food in the Last Year: Never true  Transportation Needs: No Transportation Needs (12/05/2022)   PRAPARE - Administrator, Civil Service (Medical): No    Lack of Transportation (Non-Medical): No  Physical Activity: Sufficiently Active (12/05/2022)   Exercise Vital Sign    Days of Exercise per Week: 4 days    Minutes of Exercise per Session: 40 min  Stress: No Stress Concern Present (12/05/2022)   [employer] of Occupational Health - Occupational Stress Questionnaire    Feeling of Stress : Not at all  Social Connections: Moderately Isolated (12/05/2022)   Social Connection and Isolation Panel [NHANES]    Frequency of Communication with Friends and Family: Three times a week    Frequency of Social Gatherings with Friends and Family: Never    Attends Religious Services: Never    Database administrator or Organizations: No    Attends Engineer, structural: Never    Marital Status: Married    Allergies  Allergen Reactions   Amlodipine Other (See Comments)    "Dental issues"    Aspirin Hives   Clonidine Derivatives Other (See Comments)    Unknown reaction   Darvon [Propoxyphene  Hcl] Other (See Comments)    Dizziness   Lisinopril Other (See Comments)    Gout    Toprol Xl [Metoprolol] Other (See Comments)    Nightmares    Valium [Diazepam] Other (See Comments)    Dizziness    Outpatient Medications Prior to Visit  Medication Sig Dispense Refill   empagliflozin (JARDIANCE) 10 MG TABS tablet Take 1 tablet (10 mg total) by mouth daily before breakfast. For CHF (Patient not taking: Reported on 02/06/2023) 90 tablet 1   hydrALAZINE (APRESOLINE) 25 MG tablet TAKE 1 TABLET BY MOUTH THREE TIMES A DAY 270 tablet 0   irbesartan (AVAPRO) 150 MG tablet Take 1 tablet (150 mg total) by mouth 2 (two) times daily. 60 tablet 6   labetalol (NORMODYNE) 200 MG tablet TAKE 2 TABLETS BY MOUTH THREE TIMES A DAY 180 tablet 5   rosuvastatin (CRESTOR) 10 MG tablet  Take 1 tablet (10 mg total) by mouth daily. (Patient not taking: Reported on 02/06/2023) 90 tablet 3   spironolactone (ALDACTONE) 25 MG tablet TAKE 1 TABLET (25 MG TOTAL) BY MOUTH DAILY. 90 tablet 0   No facility-administered medications prior to visit.     ROS Review of Systems *** Objective:  BP 115/62   Pulse 75   Ht 5\' 4"  (1.626 m)   Wt 185 lb 9.6 oz (84.2 kg)   SpO2 99%   BMI 31.86 kg/m      02/14/2023    9:41 AM 02/06/2023   11:00 AM 12/27/2022    9:31 AM  BP/Weight  Systolic BP 115  161  Diastolic BP 62  65  Wt. (Lbs) 185.6 186 186  BMI 31.86 kg/m2 31.93 kg/m2 31.93 kg/m2      Physical Exam ***    Latest Ref Rng & Units 11/29/2022    4:00 PM 09/12/2022    2:33 PM 07/02/2021    3:06 PM  CMP  Glucose 70 - 99 mg/dL 93  79  94   BUN 8 - 27 mg/dL 16  12  12    Creatinine 0.57 - 1.00 mg/dL 0.96  0.45  4.09   Sodium 134 - 144 mmol/L 140  143  146   Potassium 3.5 - 5.2 mmol/L 4.2  4.0  4.8   Chloride 96 - 106 mmol/L 108  107  109   CO2 20 - 29 mmol/L 20  23  23    Calcium 8.7 - 10.3 mg/dL 9.7  9.7  9.9   Total Protein 6.0 - 8.5 g/dL 6.8  7.2    Total Bilirubin 0.0 - 1.2 mg/dL 0.5  0.6    Alkaline Phos 44 - 121 IU/L 109  110    AST 0 - 40 IU/L 16  24    ALT 0 - 32 IU/L 11  17      Lipid Panel     Component Value Date/Time   CHOL 159 05/20/2021 0800   TRIG 95 05/20/2021 0800   HDL 45 05/20/2021 0800   CHOLHDL 3.5 05/20/2021 0800   LDLCALC 96 05/20/2021 0800    CBC    Component Value Date/Time   WBC 7.2 06/22/2021 1238   RBC 4.53 06/22/2021 1238   HGB 13.5 06/22/2021 1238   HCT 39.6 06/22/2021 1238   PLT 247 06/22/2021 1238   MCV 87.4 06/22/2021 1238   MCH 29.8 06/22/2021 1238   MCHC 34.1 06/22/2021 1238   RDW 12.2 06/22/2021 1238   LYMPHSABS 1.4 06/22/2021 1238   MONOABS 0.5 06/22/2021 1238  EOSABS 0.2 06/22/2021 1238   BASOSABS 0.0 06/22/2021 1238    Lab Results  Component Value Date   HGBA1C 4.9 09/12/2022    Assessment & Plan:   Assessment and Plan               No orders of the defined types were placed in this encounter.   Follow-up: No follow-ups on file.       Hoy Register, MD, FAAFP. [facility] [place], [place] [phone]   02/14/2023, 10:01 AM

## 2023-02-15 ENCOUNTER — Encounter: Payer: Self-pay | Admitting: Family Medicine

## 2023-02-22 ENCOUNTER — Ambulatory Visit: Payer: Medicare Other | Attending: Family Medicine

## 2023-02-22 DIAGNOSIS — I10 Essential (primary) hypertension: Secondary | ICD-10-CM

## 2023-02-23 LAB — CMP14+EGFR
ALT: 7 [IU]/L (ref 0–32)
AST: 9 [IU]/L (ref 0–40)
Albumin: 4.4 g/dL (ref 3.9–4.9)
Alkaline Phosphatase: 100 [IU]/L (ref 44–121)
BUN/Creatinine Ratio: 17 (ref 12–28)
BUN: 26 mg/dL (ref 8–27)
Bilirubin Total: 0.5 mg/dL (ref 0.0–1.2)
CO2: 20 mmol/L (ref 20–29)
Calcium: 9.9 mg/dL (ref 8.7–10.3)
Chloride: 110 mmol/L — ABNORMAL HIGH (ref 96–106)
Creatinine, Ser: 1.55 mg/dL — ABNORMAL HIGH (ref 0.57–1.00)
Globulin, Total: 2.4 g/dL (ref 1.5–4.5)
Glucose: 94 mg/dL (ref 70–99)
Potassium: 5 mmol/L (ref 3.5–5.2)
Sodium: 143 mmol/L (ref 134–144)
Total Protein: 6.8 g/dL (ref 6.0–8.5)
eGFR: 36 mL/min/{1.73_m2} — ABNORMAL LOW (ref >59)

## 2023-02-23 LAB — LP+NON-HDL CHOLESTEROL
Cholesterol, Total: 150 mg/dL (ref 100–199)
HDL: 43 mg/dL (ref >39)
LDL Chol Calc (NIH): 96 mg/dL (ref 0–99)
Total Non-HDL-Chol (LDL+VLDL): 107 mg/dL (ref 0–129)
Triglycerides: 50 mg/dL (ref 0–149)
VLDL Cholesterol Cal: 11 mg/dL (ref 5–40)

## 2023-02-28 DIAGNOSIS — I701 Atherosclerosis of renal artery: Secondary | ICD-10-CM | POA: Diagnosis not present

## 2023-02-28 DIAGNOSIS — D631 Anemia in chronic kidney disease: Secondary | ICD-10-CM | POA: Diagnosis not present

## 2023-02-28 DIAGNOSIS — I129 Hypertensive chronic kidney disease with stage 1 through stage 4 chronic kidney disease, or unspecified chronic kidney disease: Secondary | ICD-10-CM | POA: Diagnosis not present

## 2023-02-28 DIAGNOSIS — M109 Gout, unspecified: Secondary | ICD-10-CM | POA: Diagnosis not present

## 2023-02-28 DIAGNOSIS — N189 Chronic kidney disease, unspecified: Secondary | ICD-10-CM | POA: Diagnosis not present

## 2023-02-28 DIAGNOSIS — N1831 Chronic kidney disease, stage 3a: Secondary | ICD-10-CM | POA: Diagnosis not present

## 2023-03-02 ENCOUNTER — Telehealth: Payer: Self-pay | Admitting: Family Medicine

## 2023-03-02 NOTE — Telephone Encounter (Signed)
Copied from CRM [id number]. Topic: General - Other >> Mar 01, 2023  1:26 PM April [name] wrote: Reason for CRM: Pt is calling in requesting that the nurse give her a call back. Pt didn't specify the reason

## 2023-03-03 NOTE — Telephone Encounter (Signed)
Returned pt call. Pt states she doesn't know why she has to always call to get a refill on hydralazine. Made pt aware that Dr. Alvis Lemmings sent in rx on last visit 02/14/23 with a quantiy of 270 with 1 refill. Pt states she didn't get that. Pt states she has been to the pharmacy and she has contacted them. Made pt aware that I will reach out to pharmacy if I can place her on a hold.  Contacted the pharmacy and per the pharmacist pt picked up rx on 01/20/23 for a quantity of 270 so pt is requesting refill to soon. Pt will not be due for a refill till December. Made pt aware of this and pt states that is not correct she didn't receive a quantity of 270 when she picked up in september. I apologized to patient and made pt aware that she will need to reach out to the pharmacy in regards to this. Pt states she understands and doesn't have any questions or concerns

## 2023-03-03 NOTE — Telephone Encounter (Signed)
Returned pt call pt didn't answer lvm  

## 2023-03-27 ENCOUNTER — Other Ambulatory Visit: Payer: Self-pay | Admitting: Physician Assistant

## 2023-05-23 ENCOUNTER — Ambulatory Visit: Payer: Medicare Other | Attending: Family Medicine | Admitting: Family Medicine

## 2023-05-23 ENCOUNTER — Encounter: Payer: Self-pay | Admitting: Family Medicine

## 2023-05-23 VITALS — BP 154/68 | HR 91 | Ht 64.0 in | Wt 187.0 lb

## 2023-05-23 DIAGNOSIS — N1831 Chronic kidney disease, stage 3a: Secondary | ICD-10-CM

## 2023-05-23 DIAGNOSIS — I10 Essential (primary) hypertension: Secondary | ICD-10-CM

## 2023-05-23 DIAGNOSIS — E782 Mixed hyperlipidemia: Secondary | ICD-10-CM | POA: Diagnosis not present

## 2023-05-23 MED ORDER — IRBESARTAN 150 MG PO TABS: 150.0000 mg | ORAL_TABLET | Freq: Two times a day (BID) | ORAL | 0 refills | Status: DC

## 2023-05-23 MED ORDER — SPIRONOLACTONE 25 MG PO TABS: 25.0000 mg | ORAL_TABLET | Freq: Every day | ORAL | 1 refills | Status: DC

## 2023-05-23 MED ORDER — IRBESARTAN 150 MG PO TABS: 150.0000 mg | ORAL_TABLET | Freq: Two times a day (BID) | ORAL | 1 refills | Status: DC

## 2023-05-23 MED ORDER — CARVEDILOL 3.125 MG PO TABS: 3.1250 mg | ORAL_TABLET | Freq: Two times a day (BID) | ORAL | 1 refills | Status: DC

## 2023-05-23 MED ORDER — HYDRALAZINE HCL 25 MG PO TABS: 25.0000 mg | ORAL_TABLET | Freq: Three times a day (TID) | ORAL | 1 refills | Status: DC

## 2023-05-23 MED ORDER — ROSUVASTATIN CALCIUM 10 MG PO TABS: 10.0000 mg | ORAL_TABLET | Freq: Every day | ORAL | 3 refills | Status: DC

## 2023-05-23 MED ORDER — EMPAGLIFLOZIN 10 MG PO TABS: 10.0000 mg | ORAL_TABLET | Freq: Every day | ORAL | 0 refills | Status: DC

## 2023-05-23 MED ORDER — SPIRONOLACTONE 25 MG PO TABS: 25.0000 mg | ORAL_TABLET | Freq: Every day | ORAL | 0 refills | Status: DC

## 2023-05-23 MED ORDER — HYDRALAZINE HCL 25 MG PO TABS: 25.0000 mg | ORAL_TABLET | Freq: Three times a day (TID) | ORAL | 0 refills | Status: DC

## 2023-05-23 MED ORDER — CARVEDILOL 3.125 MG PO TABS: 3.1250 mg | ORAL_TABLET | Freq: Two times a day (BID) | ORAL | 0 refills | Status: DC

## 2023-05-23 MED ORDER — ROSUVASTATIN CALCIUM 10 MG PO TABS: 10.0000 mg | ORAL_TABLET | Freq: Every day | ORAL | 0 refills | Status: DC

## 2023-05-23 MED ORDER — EMPAGLIFLOZIN 10 MG PO TABS: 10.0000 mg | ORAL_TABLET | Freq: Every day | ORAL | 1 refills | Status: DC

## 2023-05-23 NOTE — Progress Notes (Signed)
Subjective:  Patient ID: April [name], female    DOB: 11/12/1955  Age: 68 y.o. MRN: [id number]  CC: Medical Management of Chronic Issues (Discuss Labetalol/BP running low/1 month of medication to [facility], remaining refills to [facility] )   HPI April [name] is a 68 y.o. year old female with a history of hypertension, hyperlipidemia, renal artery stenosis, HFpEF (EF 70-75 % from echo 2021), Gout, CKD stage 3.   Interval History: Discussed the use of AI scribe software for clinical note transcription with the patient, who gave verbal consent to proceed.  She presents with concerns of low blood pressure. She reports feeling lightheaded and faint with home readings as low as 103/60. Despite these symptoms, she has not stopped any of her medications.  Her blood pressure is elevated today even though it was normal at her last visit.  She states she has whitecoat hypertension and elevation is due to the fact that the nurse brought in a nursing student along with her to check her vitals.  She expresses concern about her current medication, labetalol, due to information received from the pharmacy suggesting it could worsen her congestive heart failure.  She denies any presence of dyspnea but does still sometimes have pedal edema.  The patient also mentions a recent rescheduling of her colonoscopy and an upcoming bone density test. She reports some back pain, which she thought might be related to her kidney disease, but she was informed that this is not typically the case.  She had an appointment to see nephrology but states when she presented for her visit she just had lab work done and was unable to see the physician but has an upcoming appointment scheduled for next week.  She also mentions having arthritic knees and gout in her feet, which limit her ability to exercise, though she does some flexibility and stretching exercises at home.  She also reports that she stopped taking her cholesterol  medication, rosuvastatin, due to concerns about 'potential kidney damage'.        Past Medical History:  Diagnosis Date   Anxiety    Atypical chest pain 03/28/2020   CKD (chronic kidney disease) stage 3, GFR 30-59 ml/min (HCC) 11/01/2019   Hypertension    Iron (Fe) deficiency anemia    Overweight 06/22/2021   Renal artery stenosis (HCC) 05/14/2021    Past Surgical History:  Procedure Laterality Date   CESAREAN SECTION      Family History  Problem Relation Age of Onset   CVA Mother    Hypertension Mother    Hypertension Sister    CVA Sister    Hypertension Brother    Hypertension Maternal Grandmother    Kidney failure Neg Hx     Social History   Socioeconomic History   Marital status: Married    Spouse name: Not on file   Number of children: Not on file   Years of education: Not on file   Highest education level: Not on file  Occupational History   Occupation: retired  Tobacco Use   Smoking status: Never   Smokeless tobacco: Never  Substance and Sexual Activity   Alcohol use: Not Currently    Comment: rare   Drug use: Never   Sexual activity: Not on file  Other Topics Concern   Not on file  Social History Narrative   Not on file   Social Drivers of Health   Financial Resource Strain: Low Risk  (12/05/2022)   Overall Physicist, medical Strain (  CARDIA)    Difficulty of Paying Living Expenses: Not hard at all  Food Insecurity: No Food Insecurity (12/05/2022)   Hunger Vital Sign    Worried About Running Out of Food in the Last Year: Never true    Ran Out of Food in the Last Year: Never true  Transportation Needs: No Transportation Needs (12/05/2022)   PRAPARE - Administrator, Civil Service (Medical): No    Lack of Transportation (Non-Medical): No  Physical Activity: Sufficiently Active (12/05/2022)   Exercise Vital Sign    Days of Exercise per Week: 4 days    Minutes of Exercise per Session: 40 min  Stress: No Stress Concern Present (12/05/2022)    [employer] of Occupational Health - Occupational Stress Questionnaire    Feeling of Stress : Not at all  Social Connections: Moderately Isolated (12/05/2022)   Social Connection and Isolation Panel [NHANES]    Frequency of Communication with Friends and Family: Three times a week    Frequency of Social Gatherings with Friends and Family: Never    Attends Religious Services: Never    Database administrator or Organizations: No    Attends Engineer, structural: Never    Marital Status: Married    Allergies  Allergen Reactions   Amlodipine Other (See Comments)    "Dental issues"    Aspirin Hives   Clonidine Derivatives Other (See Comments)    Unknown reaction   Darvon [Propoxyphene Hcl] Other (See Comments)    Dizziness   Lisinopril Other (See Comments)    Gout    Toprol Xl [Metoprolol] Other (See Comments)    Nightmares    Valium [Diazepam] Other (See Comments)    Dizziness    Outpatient Medications Prior to Visit  Medication Sig Dispense Refill   empagliflozin (JARDIANCE) 10 MG TABS tablet Take 1 tablet (10 mg total) by mouth daily before breakfast. For CHF 90 tablet 1   hydrALAZINE (APRESOLINE) 25 MG tablet Take 1 tablet (25 mg total) by mouth 3 (three) times daily. 270 tablet 1   irbesartan (AVAPRO) 150 MG tablet TAKE 1 TABLET BY MOUTH TWICE A DAY 180 tablet 1   labetalol (NORMODYNE) 200 MG tablet TAKE 2 TABLETS BY MOUTH THREE TIMES A DAY 180 tablet 5   spironolactone (ALDACTONE) 25 MG tablet Take 1 tablet (25 mg total) by mouth daily. 90 tablet 1   rosuvastatin (CRESTOR) 10 MG tablet Take 1 tablet (10 mg total) by mouth daily. (Patient not taking: Reported on 02/06/2023) 90 tablet 3   No facility-administered medications prior to visit.     ROS Review of Systems  Constitutional:  Negative for activity change and appetite change.  HENT:  Negative for sinus pressure and sore throat.   Respiratory:  Negative for chest tightness, shortness of breath and  wheezing.   Cardiovascular:  Negative for chest pain and palpitations.  Gastrointestinal:  Negative for abdominal distention, abdominal pain and constipation.  Genitourinary: Negative.   Musculoskeletal: Negative.   Psychiatric/Behavioral:  Negative for behavioral problems and dysphoric mood.     Objective:  BP (!) 154/68   Pulse 91   Ht 5\' 4"  (1.626 m)   Wt 187 lb (84.8 kg)   SpO2 99%   BMI 32.10 kg/m      05/23/2023    2:10 PM 05/23/2023    1:42 PM 02/14/2023    9:41 AM  BP/Weight  Systolic BP 154 169 115  Diastolic BP 68 63 62  Wt. (Lbs)  187 185.6  BMI  32.1 kg/m2 31.86 kg/m2      Physical Exam Constitutional:      Appearance: She is well-developed.  Cardiovascular:     Rate and Rhythm: Normal rate.     Heart sounds: Normal heart sounds. No murmur heard. Pulmonary:     Effort: Pulmonary effort is normal.     Breath sounds: Normal breath sounds. No wheezing or rales.  Chest:     Chest wall: No tenderness.  Abdominal:     General: Bowel sounds are normal. There is no distension.     Palpations: Abdomen is soft. There is no mass.     Tenderness: There is no abdominal tenderness.  Musculoskeletal:        General: Normal range of motion.     Right lower leg: No edema.     Left lower leg: No edema.  Neurological:     Mental Status: She is alert and oriented to person, place, and time.  Psychiatric:        Mood and Affect: Mood normal.        Latest Ref Rng & Units 02/22/2023    9:40 AM 11/29/2022    4:00 PM 09/12/2022    2:33 PM  CMP  Glucose 70 - 99 mg/dL 94  93  79   BUN 8 - 27 mg/dL 26  16  12    Creatinine 0.57 - 1.00 mg/dL 1.30  8.65  7.84   Sodium 134 - 144 mmol/L 143  140  143   Potassium 3.5 - 5.2 mmol/L 5.0  4.2  4.0   Chloride 96 - 106 mmol/L 110  108  107   CO2 20 - 29 mmol/L 20  20  23    Calcium 8.7 - 10.3 mg/dL 9.9  9.7  9.7   Total Protein 6.0 - 8.5 g/dL 6.8  6.8  7.2   Total Bilirubin 0.0 - 1.2 mg/dL 0.5  0.5  0.6   Alkaline Phos 44 -  121 IU/L 100  109  110   AST 0 - 40 IU/L 9  16  24    ALT 0 - 32 IU/L 7  11  17      Lipid Panel     Component Value Date/Time   CHOL 150 02/22/2023 0940   TRIG 50 02/22/2023 0940   HDL 43 02/22/2023 0940   CHOLHDL 3.5 05/20/2021 0800   LDLCALC 96 02/22/2023 0940    CBC    Component Value Date/Time   WBC 7.2 06/22/2021 1238   RBC 4.53 06/22/2021 1238   HGB 13.5 06/22/2021 1238   HCT 39.6 06/22/2021 1238   PLT 247 06/22/2021 1238   MCV 87.4 06/22/2021 1238   MCH 29.8 06/22/2021 1238   MCHC 34.1 06/22/2021 1238   RDW 12.2 06/22/2021 1238   LYMPHSABS 1.4 06/22/2021 1238   MONOABS 0.5 06/22/2021 1238   EOSABS 0.2 06/22/2021 1238   BASOSABS 0.0 06/22/2021 1238    Lab Results  Component Value Date   HGBA1C 4.9 09/12/2022    Assessment & Plan:      Hypertension Reports of low blood pressure readings at home with associated lightheadedness. Today's office reading was high, possibly due to white coat syndrome as it was normal at her last office visit with me. -Check repeat blood pressure in office today is still elevated. -I have substituted labetalol with carvedilol due to her concerns about labetalol.  If she runs hypotensive discontinuing carvedilol may be a good idea. -She has  a follow-up appointment with nephrology next week and her blood pressure will be checked at that visit  Congestive Heart Failure (Diastolic) -Euvolemic with EF of 70 to 75% from echo of 2021 Patient concerned about use of Labetalol due to potential worsening of heart failure which she saw on the pharmacy leaflet which accompanied her labetalol prescription. -Explained that labetalol is a nonspecific beta-blocker but I am switching her to carvedilol.  Would love to place on metoprolol but her allergy list reveals presence of metoprolol  Hyperlipidemia Patient not taking Rosuvastatin due to concerns about potential kidney damage. -Educate patient on the benefits of statin therapy in reducing  cardiovascular risk, and that liver, not kidney, abnormalities are the more common side effect. -Advise patient to restart Rosuvastatin.  Chronic Kidney Disease (Stage 3A) Patient has upcoming appointment with nephrologist. -Order labs today to check current kidney function.   General Health Maintenance / Followup Plans -Colonoscopy to be rescheduled. -Bone density test scheduled for February. -Mammogram due. -Plan to send 30-day supply of new medication to pharmacy, followed by 90-day supply to home delivery service. -Follow up with nephrologist next week. -Follow up after receipt of lab results.          Meds ordered this encounter  Medications   DISCONTD: carvedilol (COREG) 3.125 MG tablet    Sig: Take 1 tablet (3.125 mg total) by mouth 2 (two) times daily with a meal.    Dispense:  180 tablet    Refill:  1    Discontinue labetalol   DISCONTD: empagliflozin (JARDIANCE) 10 MG TABS tablet    Sig: Take 1 tablet (10 mg total) by mouth daily before breakfast. For CHF    Dispense:  90 tablet    Refill:  1   DISCONTD: hydrALAZINE (APRESOLINE) 25 MG tablet    Sig: Take 1 tablet (25 mg total) by mouth 3 (three) times daily.    Dispense:  270 tablet    Refill:  1   DISCONTD: irbesartan (AVAPRO) 150 MG tablet    Sig: Take 1 tablet (150 mg total) by mouth 2 (two) times daily.    Dispense:  180 tablet    Refill:  1   DISCONTD: rosuvastatin (CRESTOR) 10 MG tablet    Sig: Take 1 tablet (10 mg total) by mouth daily.    Dispense:  90 tablet    Refill:  3   DISCONTD: spironolactone (ALDACTONE) 25 MG tablet    Sig: Take 1 tablet (25 mg total) by mouth daily.    Dispense:  90 tablet    Refill:  1   carvedilol (COREG) 3.125 MG tablet    Sig: Take 1 tablet (3.125 mg total) by mouth 2 (two) times daily with a meal.    Dispense:  30 tablet    Refill:  0    Discontinue labetalol   empagliflozin (JARDIANCE) 10 MG TABS tablet    Sig: Take 1 tablet (10 mg total) by mouth daily before  breakfast. For CHF    Dispense:  30 tablet    Refill:  0   hydrALAZINE (APRESOLINE) 25 MG tablet    Sig: Take 1 tablet (25 mg total) by mouth 3 (three) times daily.    Dispense:  90 tablet    Refill:  0   irbesartan (AVAPRO) 150 MG tablet    Sig: Take 1 tablet (150 mg total) by mouth 2 (two) times daily.    Dispense:  30 tablet    Refill:  0   rosuvastatin (  CRESTOR) 10 MG tablet    Sig: Take 1 tablet (10 mg total) by mouth daily.    Dispense:  30 tablet    Refill:  0   spironolactone (ALDACTONE) 25 MG tablet    Sig: Take 1 tablet (25 mg total) by mouth daily.    Dispense:  30 tablet    Refill:  0    Follow-up: Return in about 3 months (around 08/21/2023) for Chronic medical conditions.       Hoy Register, MD, FAAFP. [facility] [place], [place] [phone]   05/23/2023, 3:43 PM

## 2023-05-23 NOTE — Patient Instructions (Signed)
Exercising to Stay Healthy To become healthy and stay healthy, it is recommended that you do moderate-intensity and vigorous-intensity exercise. You can tell that you are exercising at a moderate intensity if your heart starts beating faster and you start breathing faster but can still hold a conversation. You can tell that you are exercising at a vigorous intensity if you are breathing much harder and faster and cannot hold a conversation while exercising. How can exercise benefit me? Exercising regularly is important. It has many health benefits, such as: Improving overall fitness, flexibility, and endurance. Increasing bone density. Helping with weight control. Decreasing body fat. Increasing muscle strength and endurance. Reducing stress and tension, anxiety, depression, or anger. Improving overall health. What guidelines should I follow while exercising? Before you start a new exercise program, talk with your health care provider. Do not exercise so much that you hurt yourself, feel dizzy, or get very short of breath. Wear comfortable clothes and wear shoes with good support. Drink plenty of water while you exercise to prevent dehydration or heat stroke. Work out until your breathing and your heartbeat get faster (moderate intensity). How often should I exercise? Choose an activity that you enjoy, and set realistic goals. Your health care provider can help you make an activity plan that is individually designed and works best for you. Exercise regularly as told by your health care provider. This may include: Doing strength training two times a week, such as: Lifting weights. Using resistance bands. Push-ups. Sit-ups. Yoga. Doing a certain intensity of exercise for a given amount of time. Choose from these options: A total of 150 minutes of moderate-intensity exercise every week. A total of 75 minutes of vigorous-intensity exercise every week. A mix of moderate-intensity and  vigorous-intensity exercise every week. Children, pregnant women, people who have not exercised regularly, people who are overweight, and older adults may need to talk with a health care provider about what activities are safe to perform. If you have a medical condition, be sure to talk with your health care provider before you start a new exercise program. What are some exercise ideas? Moderate-intensity exercise ideas include: Walking 1 mile (1.6 km) in about 15 minutes. Biking. Hiking. Golfing. Dancing. Water aerobics. Vigorous-intensity exercise ideas include: Walking 4.5 miles (7.2 km) or more in about 1 hour. Jogging or running 5 miles (8 km) in about 1 hour. Biking 10 miles (16.1 km) or more in about 1 hour. Lap swimming. Roller-skating or in-line skating. Cross-country skiing. Vigorous competitive sports, such as football, basketball, and soccer. Jumping rope. Aerobic dancing. What are some everyday activities that can help me get exercise? Yard work, such as: Pushing a lawn mower. Raking and bagging leaves. Washing your car. Pushing a stroller. Shoveling snow. Gardening. Washing windows or floors. How can I be more active in my day-to-day activities? Use stairs instead of an elevator. Take a walk during your lunch break. If you drive, park your car farther away from your work or school. If you take public transportation, get off one stop early and walk the rest of the way. Stand up or walk around during all of your indoor phone calls. Get up, stretch, and walk around every 30 minutes throughout the day. Enjoy exercise with a friend. Support to continue exercising will help you keep a regular routine of activity. Where to find more information You can find more information about exercising to stay healthy from: U.S. Department of Health and Human Services: www.hhs.gov Centers for Disease Control and Prevention (  CDC): www.cdc.gov Summary Exercising regularly is  important. It will improve your overall fitness, flexibility, and endurance. Regular exercise will also improve your overall health. It can help you control your weight, reduce stress, and improve your bone density. Do not exercise so much that you hurt yourself, feel dizzy, or get very short of breath. Before you start a new exercise program, talk with your health care provider. This information is not intended to replace advice given to you by your health care provider. Make sure you discuss any questions you have with your health care provider. Document Revised: 08/14/2020 Document Reviewed: 08/14/2020 Elsevier Patient Education  2024 [employer]  

## 2023-05-24 ENCOUNTER — Telehealth: Payer: Self-pay

## 2023-05-24 DIAGNOSIS — I10 Essential (primary) hypertension: Secondary | ICD-10-CM

## 2023-05-24 NOTE — Telephone Encounter (Signed)
Routing to PCP for review.

## 2023-05-24 NOTE — Telephone Encounter (Addendum)
Patient came in requesting a prescription change, patient states that she is allergic to CARVEDILOL and wanting something different.

## 2023-05-25 ENCOUNTER — Other Ambulatory Visit: Payer: Self-pay | Admitting: Physician Assistant

## 2023-05-25 ENCOUNTER — Telehealth: Payer: Self-pay

## 2023-05-25 DIAGNOSIS — I10 Essential (primary) hypertension: Secondary | ICD-10-CM

## 2023-05-25 MED ORDER — IRBESARTAN 150 MG PO TABS: 150.0000 mg | ORAL_TABLET | Freq: Two times a day (BID) | ORAL | 2 refills | Status: DC

## 2023-05-25 MED ORDER — HYDRALAZINE HCL 50 MG PO TABS: 50.0000 mg | ORAL_TABLET | Freq: Three times a day (TID) | ORAL | 1 refills | Status: DC

## 2023-05-25 NOTE — Telephone Encounter (Signed)
Pt was called and informed of medication change. 

## 2023-05-25 NOTE — Addendum Note (Signed)
Addended by: Hoy Register on: 05/25/2023 08:48 AM   Modules accepted: Orders

## 2023-05-25 NOTE — Telephone Encounter (Signed)
Please advise her to increase her hydralazine from 25 mg 3 times daily to 50 mg 3 times daily that way she does not need to take the carvedilol.  She can take 2 of her current 25 mg tablets to make up to 50 mg until she runs out and a new prescription will read 50 mg.

## 2023-05-25 NOTE — Telephone Encounter (Signed)
Discard

## 2023-05-29 DIAGNOSIS — I129 Hypertensive chronic kidney disease with stage 1 through stage 4 chronic kidney disease, or unspecified chronic kidney disease: Secondary | ICD-10-CM | POA: Diagnosis not present

## 2023-05-29 DIAGNOSIS — I5030 Unspecified diastolic (congestive) heart failure: Secondary | ICD-10-CM | POA: Diagnosis not present

## 2023-05-29 DIAGNOSIS — E875 Hyperkalemia: Secondary | ICD-10-CM | POA: Diagnosis not present

## 2023-05-29 DIAGNOSIS — E213 Hyperparathyroidism, unspecified: Secondary | ICD-10-CM | POA: Diagnosis not present

## 2023-05-29 DIAGNOSIS — N1831 Chronic kidney disease, stage 3a: Secondary | ICD-10-CM | POA: Diagnosis not present

## 2023-05-30 LAB — LAB REPORT - SCANNED
Calcium: 10.1
Creatinine, POC: 39.3 mg/dL
EGFR: 50
PTH: 62

## 2023-06-13 DIAGNOSIS — N1831 Chronic kidney disease, stage 3a: Secondary | ICD-10-CM | POA: Diagnosis not present

## 2023-06-19 ENCOUNTER — Other Ambulatory Visit: Payer: Medicare Other

## 2023-08-21 ENCOUNTER — Encounter: Payer: Self-pay | Admitting: Family Medicine

## 2023-08-21 ENCOUNTER — Ambulatory Visit: Payer: Medicare Other | Attending: Family Medicine | Admitting: Family Medicine

## 2023-08-21 VITALS — BP 125/64 | HR 81 | Temp 98.4°F | Resp 16 | Ht 64.0 in | Wt 177.0 lb

## 2023-08-21 DIAGNOSIS — N183 Chronic kidney disease, stage 3 unspecified: Secondary | ICD-10-CM | POA: Insufficient documentation

## 2023-08-21 DIAGNOSIS — Z1231 Encounter for screening mammogram for malignant neoplasm of breast: Secondary | ICD-10-CM | POA: Diagnosis not present

## 2023-08-21 DIAGNOSIS — I5032 Chronic diastolic (congestive) heart failure: Secondary | ICD-10-CM | POA: Diagnosis not present

## 2023-08-21 DIAGNOSIS — E559 Vitamin D deficiency, unspecified: Secondary | ICD-10-CM | POA: Insufficient documentation

## 2023-08-21 DIAGNOSIS — I129 Hypertensive chronic kidney disease with stage 1 through stage 4 chronic kidney disease, or unspecified chronic kidney disease: Secondary | ICD-10-CM | POA: Diagnosis not present

## 2023-08-21 DIAGNOSIS — I130 Hypertensive heart and chronic kidney disease with heart failure and stage 1 through stage 4 chronic kidney disease, or unspecified chronic kidney disease: Secondary | ICD-10-CM | POA: Diagnosis not present

## 2023-08-21 DIAGNOSIS — R002 Palpitations: Secondary | ICD-10-CM | POA: Insufficient documentation

## 2023-08-21 DIAGNOSIS — I110 Hypertensive heart disease with heart failure: Secondary | ICD-10-CM | POA: Diagnosis not present

## 2023-08-21 DIAGNOSIS — E875 Hyperkalemia: Secondary | ICD-10-CM | POA: Insufficient documentation

## 2023-08-21 DIAGNOSIS — E782 Mixed hyperlipidemia: Secondary | ICD-10-CM

## 2023-08-21 DIAGNOSIS — D638 Anemia in other chronic diseases classified elsewhere: Secondary | ICD-10-CM

## 2023-08-21 DIAGNOSIS — D509 Iron deficiency anemia, unspecified: Secondary | ICD-10-CM | POA: Insufficient documentation

## 2023-08-21 DIAGNOSIS — E2839 Other primary ovarian failure: Secondary | ICD-10-CM

## 2023-08-21 MED ORDER — EMPAGLIFLOZIN 10 MG PO TABS: 10.0000 mg | ORAL_TABLET | Freq: Every day | ORAL | 1 refills | Status: DC

## 2023-08-21 MED ORDER — ROSUVASTATIN CALCIUM 10 MG PO TABS: 10.0000 mg | ORAL_TABLET | Freq: Every day | ORAL | 1 refills | Status: DC

## 2023-08-21 MED ORDER — FERROUS GLUCONATE 324 (38 FE) MG PO TABS: 324.0000 mg | ORAL_TABLET | Freq: Two times a day (BID) | ORAL | 6 refills | Status: AC

## 2023-08-21 MED ORDER — SPIRONOLACTONE 25 MG PO TABS
25.0000 mg | ORAL_TABLET | Freq: Every day | ORAL | 1 refills | Status: AC
Start: 2023-08-21 — End: ?

## 2023-08-21 MED ORDER — IRBESARTAN 150 MG PO TABS
150.0000 mg | ORAL_TABLET | Freq: Two times a day (BID) | ORAL | 1 refills | Status: AC
Start: 2023-08-21 — End: ?

## 2023-08-21 NOTE — Progress Notes (Signed)
 Subjective:  Patient ID: April [name], female    DOB: [birth year]  Age: 68 y.o. MRN: [id number]  CC: Hypertension and Chronic Kidney Disease     Discussed the use of AI scribe software for clinical note transcription with the patient, who gave verbal consent to proceed.  History of Present Illness The patient, with a history of hypertension, hyperlipidemia, renal artery stenosis, HFpEF (EF 70-75 % from echo 2021), Gout, CKD stage 3, presents with concerns about her recent diagnosis of hyperkalemia. She reports being on a low potassium diet and taking six pills of labetalol  daily, which she believes is contributing to her high potassium levels.  At her last visit with me she had expressed a desire to discontinue labetalol  for another antihypertensive and I had placed her on carvedilol  however her nephrologist switched her back to labetalol . The patient also reports being diagnosed with iron deficiency anemia and vitamin D deficiency. She has tried over-the-counter iron supplements, including ferrous sulfate and ferrous glycinate, but experienced stomach cramps and other adverse effects. She is considering returning to prescription iron supplements. She is currently taking over-the-counter vitamin D supplements.  In addition, the patient has been experiencing heart and chest palpitations since February. She is not currently seeing a cardiologist and is unsure if the palpitations are related to her hyperkalemia.  Lastly, the patient requests a new bone density test, as she missed her previous appointment due to illness. She also needs to reschedule a colonoscopy, which was postponed due to concerns about her low blood pressure.    Past Medical History:  Diagnosis Date   Anxiety    Atypical chest pain 03/28/2020   CKD (chronic kidney disease) stage 3, GFR 30-59 ml/min (HCC) 11/01/2019   Hypertension    Iron (Fe) deficiency anemia    Overweight 06/22/2021   Renal artery stenosis (HCC)  05/14/2021    Past Surgical History:  Procedure Laterality Date   CESAREAN SECTION      Family History  Problem Relation Age of Onset   CVA Mother    Hypertension Mother    Hypertension Sister    CVA Sister    Hypertension Brother    Hypertension Maternal Grandmother    Kidney failure Neg Hx     Social History   Socioeconomic History   Marital status: Married    Spouse name: Not on file   Number of children: Not on file   Years of education: Not on file   Highest education level: Not on file  Occupational History   Occupation: retired  Tobacco Use   Smoking status: Never   Smokeless tobacco: Never  Substance and Sexual Activity   Alcohol use: Not Currently    Comment: rare   Drug use: Never   Sexual activity: Not on file  Other Topics Concern   Not on file  Social History Narrative   Not on file   Social Drivers of Health   Financial Resource Strain: Low Risk  (12/05/2022)   Overall Financial Resource Strain (CARDIA)    Difficulty of Paying Living Expenses: Not hard at all  Food Insecurity: No Food Insecurity (12/05/2022)   Hunger Vital Sign    Worried About Running Out of Food in the Last Year: Never true    Ran Out of Food in the Last Year: Never true  Transportation Needs: No Transportation Needs (12/05/2022)   PRAPARE - Administrator, Civil Service (Medical): No    Lack of Transportation (Non-Medical): No  Physical  Activity: Sufficiently Active (12/05/2022)   Exercise Vital Sign    Days of Exercise per Week: 4 days    Minutes of Exercise per Session: 40 min  Stress: No Stress Concern Present (12/05/2022)   [employer] of Occupational Health - Occupational Stress Questionnaire    Feeling of Stress : Not at all  Social Connections: Moderately Isolated (12/05/2022)   Social Connection and Isolation Panel [NHANES]    Frequency of Communication with Friends and Family: Three times a week    Frequency of Social Gatherings with Friends and Family:  Never    Attends Religious Services: Never    Database administrator or Organizations: No    Attends Engineer, structural: Never    Marital Status: Married    Allergies  Allergen Reactions   Amlodipine Other (See Comments)    "Dental issues"    Aspirin Hives   Clonidine  Derivatives Other (See Comments)    Unknown reaction   Darvon [Propoxyphene Hcl] Other (See Comments)    Dizziness   Lisinopril  Other (See Comments)    Gout    Toprol  Xl [Metoprolol ] Other (See Comments)    Nightmares    Valium [Diazepam] Other (See Comments)    Dizziness    Outpatient Medications Prior to Visit  Medication Sig Dispense Refill   hydrochlorothiazide  (MICROZIDE ) 12.5 MG capsule Take 12.5 mg by mouth daily.     empagliflozin  (JARDIANCE ) 10 MG TABS tablet Take 1 tablet (10 mg total) by mouth daily before breakfast. For CHF 30 tablet 0   irbesartan  (AVAPRO ) 150 MG tablet Take 1 tablet (150 mg total) by mouth 2 (two) times daily. 60 tablet 2   rosuvastatin  (CRESTOR ) 10 MG tablet Take 1 tablet (10 mg total) by mouth daily. 30 tablet 0   spironolactone  (ALDACTONE ) 25 MG tablet Take 1 tablet (25 mg total) by mouth daily. 30 tablet 0   hydrALAZINE  (APRESOLINE ) 50 MG tablet TAKE 1 TABLET BY MOUTH THREE TIMES DAILY 270 tablet 0   No facility-administered medications prior to visit.     ROS Review of Systems  Constitutional:  Negative for activity change and appetite change.  HENT:  Negative for sinus pressure and sore throat.   Respiratory:  Negative for chest tightness, shortness of breath and wheezing.   Cardiovascular:  Positive for palpitations. Negative for chest pain.  Gastrointestinal:  Negative for abdominal distention, abdominal pain and constipation.  Genitourinary: Negative.   Musculoskeletal: Negative.   Psychiatric/Behavioral:  Negative for behavioral problems and dysphoric mood.     Objective:  BP 125/64 (BP Location: Left Arm, Patient Position: Sitting, Cuff Size:  Normal)   Pulse 81   Temp 98.4 F (36.9 C) (Oral)   Resp 16   Ht 5\' 4"  (1.626 m)   Wt 177 lb (80.3 kg)   SpO2 98%   BMI 30.38 kg/m      08/21/2023    2:37 PM 05/23/2023    2:10 PM 05/23/2023    1:42 PM  BP/Weight  Systolic BP 125 154 169  Diastolic BP 64 68 63  Wt. (Lbs) 177  187  BMI 30.38 kg/m2  32.1 kg/m2      Physical Exam Constitutional:      Appearance: She is well-developed.  Cardiovascular:     Rate and Rhythm: Normal rate.     Heart sounds: Normal heart sounds. No murmur heard. Pulmonary:     Effort: Pulmonary effort is normal.     Breath sounds: Normal breath sounds. No wheezing  or rales.  Chest:     Chest wall: No tenderness.  Abdominal:     General: Bowel sounds are normal. There is no distension.     Palpations: Abdomen is soft. There is no mass.     Tenderness: There is no abdominal tenderness.  Musculoskeletal:        General: Normal range of motion.     Right lower leg: No edema.     Left lower leg: No edema.  Neurological:     Mental Status: She is alert and oriented to person, place, and time.  Psychiatric:        Mood and Affect: Mood normal.        Latest Ref Rng & Units 05/29/2023   12:00 AM 02/22/2023    9:40 AM 11/29/2022    4:00 PM  CMP  Glucose 70 - 99 mg/dL  94  93   BUN 8 - 27 mg/dL  26  16   Creatinine 1.61 - 1.00 mg/dL  0.96  0.45   Sodium 409 - 144 mmol/L  143  140   Potassium 3.5 - 5.2 mmol/L  5.0  4.2   Chloride 96 - 106 mmol/L  110  108   CO2 20 - 29 mmol/L  20  20   Calcium   10.1     9.9  9.7   Total Protein 6.0 - 8.5 g/dL  6.8  6.8   Total Bilirubin 0.0 - 1.2 mg/dL  0.5  0.5   Alkaline Phos 44 - 121 IU/L  100  109   AST 0 - 40 IU/L  9  16   ALT 0 - 32 IU/L  7  11      This result is from an external source.    Lipid Panel     Component Value Date/Time   CHOL 150 02/22/2023 0940   TRIG 50 02/22/2023 0940   HDL 43 02/22/2023 0940   CHOLHDL 3.5 05/20/2021 0800   LDLCALC 96 02/22/2023 0940    CBC     Component Value Date/Time   WBC 7.2 06/22/2021 1238   RBC 4.53 06/22/2021 1238   HGB 13.5 06/22/2021 1238   HCT 39.6 06/22/2021 1238   PLT 247 06/22/2021 1238   MCV 87.4 06/22/2021 1238   MCH 29.8 06/22/2021 1238   MCHC 34.1 06/22/2021 1238   RDW 12.2 06/22/2021 1238   LYMPHSABS 1.4 06/22/2021 1238   MONOABS 0.5 06/22/2021 1238   EOSABS 0.2 06/22/2021 1238   BASOSABS 0.0 06/22/2021 1238    Lab Results  Component Value Date   HGBA1C 4.9 09/12/2022    Lab Results  Component Value Date   TSH 1.006 09/29/2019       Assessment & Plan Hypertension Blood pressure controlled at 125/64 mmHg with labetalol  and irbesartan . Previous hypotension on carvedilol . Nephrologist advised monitoring to prevent hypotension below 100 mmHg. - Continue labetalol  and irbesartan . - Monitor blood pressure regularly. - Educated on consistent irbesartan  dosing, allowing at least 8 hours between doses.  Hyperkalemia Diagnosed with hyperkalemia. On low potassium diet. Irbesartan  and spironolactone  may elevate potassium, hydrochlorothiazide  causes hypokalemia and I have had a lengthy discussion with her about the adverse effects and benefits of her medication and she expresses understanding - Continue low potassium diet. - Monitor potassium levels. - Continue hydrochlorothiazide . - Educated on medication roles in potassium management.  Iron deficiency anemia Iron deficiency anemia likely secondary to chronic kidney disease. Intolerance to ferrous sulfate and ferrous gluconate  OTC. Prefers ferrous  gluconate. - Prescribe ferrous gluconate  twice daily. - Educated on potential side effects, including constipation, and recommended laxative if needed.  Palpitations Intermittent palpitations since February. EKG reveals no cardiac etiology. Thyroid  function to be checked. Consider cardiology referral if symptoms persist. - Order thyroid  function test. - Monitor symptoms and maintain symptom diary. -  Consider cardiology referral if palpitations persist.  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 - Continue over-the-counter vitamin D supplementation.  Dietary Management Following low potassium diet due to hyperkalemia. Difficulty managing dietary restrictions and interested in weight loss. Not interested in plant-based diet but practices portion control. - Refer to renal dietitian for dietary management and weight loss support.  General Health Maintenance Due for routine screenings: bone density test, mammogram, and colonoscopy. Missed previous bone density appointment due to illness. - Order bone density test and mammogram. - Schedule colonoscopy.      Meds ordered this encounter  Medications   ferrous gluconate  (FERGON) 324 MG tablet    Sig: Take 1 tablet (324 mg total) by mouth 2 (two) times daily with a meal.    Dispense:  60 tablet    Refill:  6   empagliflozin  (JARDIANCE ) 10 MG TABS tablet    Sig: Take 1 tablet (10 mg total) by mouth daily before breakfast. For CHF    Dispense:  90 tablet    Refill:  1   irbesartan  (AVAPRO ) 150 MG tablet    Sig: Take 1 tablet (150 mg total) by mouth 2 (two) times daily.    Dispense:  180 tablet    Refill:  1   rosuvastatin  (CRESTOR ) 10 MG tablet    Sig: Take 1 tablet (10 mg total) by mouth daily.    Dispense:  90 tablet    Refill:  1   spironolactone  (ALDACTONE ) 25 MG tablet    Sig: Take 1 tablet (25 mg total) by mouth daily.    Dispense:  90 tablet    Refill:  1    Follow-up: Return in about 6 months (around 02/20/2024) for Chronic medical conditions.       Joaquin Mulberry, MD, FAAFP. [facility] [place], [place] [phone]   08/21/2023, 3:47 PM

## 2023-08-21 NOTE — Patient Instructions (Signed)
 VISIT SUMMARY:  You came in today with concerns about your recent diagnosis of high potassium levels, your blood pressure, iron deficiency anemia, vitamin D deficiency, and heart palpitations. We discussed your current medications, dietary restrictions, and the need for routine health screenings.  YOUR PLAN:  -HYPERTENSION: Your blood pressure is currently well-controlled with your medications. Hypertension means high blood pressure, which can lead to serious health problems if not managed. Continue taking labetalol  and irbesartan  as prescribed, monitor your blood pressure regularly, and ensure you take irbesartan  doses at least 8 hours apart.  -HYPERKALEMIA: Hyperkalemia means you have high levels of potassium in your blood, which can be dangerous. Continue following a low potassium diet, monitor your potassium levels, and keep taking hydrochlorothiazide . Your medications, irbesartan  and spironolactone , can raise potassium levels, while hydrochlorothiazide  helps lower it.  -IRON DEFICIENCY ANEMIA: Iron deficiency anemia means you have low levels of iron, which can cause fatigue and other symptoms. This is likely due to your chronic kidney disease. We will prescribe ferrous gluconate  to be taken twice daily. Be aware of potential side effects like constipation, and use a laxative if needed.  -PALPITATIONS: Palpitations are feelings of having a fast-beating, fluttering, or pounding heart. We will check your thyroid  function and monitor your symptoms. Keep a diary of your symptoms, and if they persist, we may refer you to a cardiologist.  -VITAMIN D DEFICIENCY: Vitamin D deficiency means you have low levels of vitamin D, which is important for bone health. Continue taking your over-the-counter vitamin D supplements.  -DIETARY MANAGEMENT: You are following a low potassium diet due to your high potassium levels and are interested in weight loss. We will refer you to a renal dietitian to help manage your  diet and support your weight loss goals.  -GENERAL HEALTH MAINTENANCE: You are due for routine health screenings, including a bone density test, mammogram, and colonoscopy. We will order these tests and help you schedule the appointments.  INSTRUCTIONS:  Please follow up with the recommended tests: bone density test, mammogram, and colonoscopy. Monitor your blood pressure and potassium levels regularly, and keep a diary of your palpitations. If your symptoms persist or worsen, contact our office for further evaluation.

## 2023-08-21 NOTE — Progress Notes (Signed)
 Discuss concern for speciality follow up.  Nephrology and Cardiology.

## 2023-08-22 NOTE — Addendum Note (Signed)
 Addended by: Hilma Steinhilber on: 08/22/2023 02:52 PM   Modules accepted: Orders

## 2023-08-24 ENCOUNTER — Ambulatory Visit: Admitting: Skilled Nursing Facility1

## 2023-10-11 ENCOUNTER — Other Ambulatory Visit: Payer: Self-pay | Admitting: Pharmacist

## 2023-10-11 NOTE — Progress Notes (Signed)
 Pharmacy Quality Measure Review  This patient is appearing on a report for being at risk of failing the adherence measure for hypertension (ACEi/ARB) medications this calendar year.   Medication: irbesartan  Last fill date: 08/01/2023 for 90 day supply  Insurance report was not up to date. No action needed at this time.  Reminder set for next fill.    Marene Shape, PharmD, Becky Bowels, CPP Clinical Pharmacist [facility] [phone]

## 2023-11-01 ENCOUNTER — Other Ambulatory Visit: Payer: Self-pay | Admitting: Pharmacist

## 2023-11-01 NOTE — Progress Notes (Signed)
 Pharmacy Quality Measure Review  This patient is appearing on a report for being at risk of failing the adherence measure for hypertension (ACEi/ARB) medications this calendar year.   Medication: irbesartan  Last fill date: 10/28/23 for 90 day supply  Insurance report was not up to date. No action needed at this time.  Reminder set for next fill.    Herlene Fleeta Morris, PharmD, JAQUELINE, CPP Clinical Pharmacist [facility] & [facility] [phone]

## 2023-11-20 DIAGNOSIS — N1831 Chronic kidney disease, stage 3a: Secondary | ICD-10-CM | POA: Diagnosis not present

## 2023-11-22 DIAGNOSIS — N1831 Chronic kidney disease, stage 3a: Secondary | ICD-10-CM | POA: Diagnosis not present

## 2023-11-27 DIAGNOSIS — E213 Hyperparathyroidism, unspecified: Secondary | ICD-10-CM | POA: Diagnosis not present

## 2023-11-27 DIAGNOSIS — E875 Hyperkalemia: Secondary | ICD-10-CM | POA: Diagnosis not present

## 2023-11-27 DIAGNOSIS — I129 Hypertensive chronic kidney disease with stage 1 through stage 4 chronic kidney disease, or unspecified chronic kidney disease: Secondary | ICD-10-CM | POA: Diagnosis not present

## 2023-11-27 DIAGNOSIS — D631 Anemia in chronic kidney disease: Secondary | ICD-10-CM | POA: Diagnosis not present

## 2023-11-27 DIAGNOSIS — I5030 Unspecified diastolic (congestive) heart failure: Secondary | ICD-10-CM | POA: Diagnosis not present

## 2023-11-27 DIAGNOSIS — N1832 Chronic kidney disease, stage 3b: Secondary | ICD-10-CM | POA: Diagnosis not present

## 2023-12-22 ENCOUNTER — Ambulatory Visit

## 2024-01-12 ENCOUNTER — Encounter

## 2024-01-24 ENCOUNTER — Other Ambulatory Visit: Payer: Self-pay | Admitting: Pharmacist

## 2024-01-24 NOTE — Progress Notes (Signed)
 Pharmacy Quality Measure Review  This patient is appearing on a report for being at risk of failing the adherence measure for hypertension (ACEi/ARB) medications this calendar year.   Medication: irbesartan  Last fill date: filled 10/28/2023. Patient has not picked-up the rxn that was filled on 01/21/24. Called and spoke to patient. She assures me that she has plenty of the irbesartan  and does not need a refill at this time. I will set a reminder to check again in a couple of weeks.    Insurance report was not up to date. No action needed at this time.  Reminder set for next fill.    Herlene Fleeta Morris, PharmD, JAQUELINE, CPP Clinical Pharmacist [facility] & [facility] [phone]

## 2024-02-01 NOTE — Progress Notes (Signed)
 April [name]                                          MRN: [id number]   02/01/2024   The [facility] Quality Team Specialist reviewed this patient medical record for the purposes of chart review for care gap closure. The following were reviewed: chart review for care gap closure-breast cancer screening and colorectal cancer screening.    [facility]BCI Quality Team

## 2024-02-09 ENCOUNTER — Other Ambulatory Visit: Payer: Self-pay | Admitting: Pharmacist

## 2024-02-09 NOTE — Progress Notes (Signed)
 Pharmacy Quality Measure Review  This patient is appearing on a report for being at risk of failing the adherence measure for hypertension (ACEi/ARB) medications this calendar year.   Medication: irbesartan  Last fill date: filled 10/28/2023. Called and spoke to patient on 01/25/24. She assured me that she had plenty of the irbesartan . I checked with her pharmacy today and she is due a refill. Authorized refill on her behalf. I called the patient to inform her but was sent to VM. Left VM explaining that irbesartan  is being refilled and she can pick this up.   Herlene Fleeta Morris, PharmD, JAQUELINE, CPP Clinical Pharmacist [facility] [phone]

## 2024-02-20 ENCOUNTER — Ambulatory Visit: Admitting: Family Medicine

## 2024-03-19 ENCOUNTER — Ambulatory Visit: Attending: Family Medicine

## 2024-03-19 VITALS — BP 124/60 | HR 82 | Temp 97.8°F | Resp 16 | Ht 64.0 in | Wt 175.0 lb

## 2024-03-19 DIAGNOSIS — Z0000 Encounter for general adult medical examination without abnormal findings: Secondary | ICD-10-CM | POA: Diagnosis not present

## 2024-03-19 NOTE — Progress Notes (Signed)
 Chief Complaint  Patient presents with   Medicare Wellness    SUBSEQUENT     Subjective:   April [name] is a 68 y.o. female who presents for a Medicare Annual Wellness Visit.  Allergies (verified) Amlodipine, Aspirin, Clonidine  derivatives, Darvon [propoxyphene hcl], Lisinopril , Toprol  xl [metoprolol ], and Valium [diazepam]   History: Past Medical History:  Diagnosis Date   Anxiety    Atypical chest pain 03/28/2020   CKD (chronic kidney disease) stage 3, GFR 30-59 ml/min (HCC) 11/01/2019   Hypertension    Iron (Fe) deficiency anemia    Overweight 06/22/2021   Renal artery stenosis 05/14/2021   Past Surgical History:  Procedure Laterality Date   CESAREAN SECTION     Family History  Problem Relation Age of Onset   CVA Mother    Hypertension Mother    Hypertension Sister    CVA Sister    Hypertension Brother    Hypertension Maternal Grandmother    Kidney failure Neg Hx    Social History   Occupational History   Occupation: retired  Tobacco Use   Smoking status: Never   Smokeless tobacco: Never  Substance and Sexual Activity   Alcohol use: Not Currently    Comment: rare   Drug use: Never   Sexual activity: Not on file   Tobacco Counseling Counseling given: Not Answered  SDOH Screenings   Food Insecurity: No Food Insecurity (03/19/2024)  Housing: Low Risk  (03/19/2024)  Transportation Needs: No Transportation Needs (03/19/2024)  Utilities: Not At Risk (03/19/2024)  Alcohol Screen: Low Risk  (12/05/2022)  Depression (PHQ2-9): Low Risk  (03/19/2024)  Financial Resource Strain: Low Risk  (12/05/2022)  Physical Activity: Sufficiently Active (03/19/2024)  Social Connections: Moderately Isolated (03/19/2024)  Stress: No Stress Concern Present (03/19/2024)  Tobacco Use: Low Risk  (03/19/2024)  Health Literacy: Adequate Health Literacy (03/19/2024)   See flowsheets for full screening details  Depression Screen PHQ 2 & 9 Depression Scale- Over the past 2 weeks,  how often have you been bothered by any of the following problems? Little interest or pleasure in doing things: 0 Feeling down, depressed, or hopeless (PHQ Adolescent also includes...irritable): 0 PHQ-2 Total Score: 0 Trouble falling or staying asleep, or sleeping too much: 0 Feeling tired or having little energy: 0 Poor appetite or overeating (PHQ Adolescent also includes...weight loss): 0 Feeling bad about yourself - or that you are a failure or have let yourself or your family down: 0 Trouble concentrating on things, such as reading the newspaper or watching television (PHQ Adolescent also includes...like school work): 0 Moving or speaking so slowly that other people could have noticed. Or the opposite - being so fidgety or restless that you have been moving around a lot more than usual: 0 Thoughts that you would be better off dead, or of hurting yourself in some way: 0 PHQ-9 Total Score: 0 If you checked off any problems, how difficult have these problems made it for you to do your work, take care of things at home, or get along with other people?: Not difficult at all  Depression Treatment Depression Interventions/Treatment : EYV7-0 Score <4 Follow-up Not Indicated     Goals Addressed             This Visit's Progress    03/02/2024: Stay healthy and out of the hospital.         Visit info / Clinical Intake: Medicare Wellness Visit Type:: Subsequent Annual Wellness Visit Persons participating in visit:: patient Medicare Wellness Visit  Mode:: In-person (required for WTM) Information given by:: patient Interpreter Needed?: No Pre-visit prep was completed: yes AWV questionnaire completed by patient prior to visit?: no Living arrangements:: lives with spouse/significant other Patient's Overall Health Status Rating: (!) fair Typical amount of pain: some Does pain affect daily life?: (!) yes Are you currently prescribed opioids?: no  Dietary Habits and Nutritional Risks How  many meals a day?: 2 Eats fruit and vegetables daily?: yes Most meals are obtained by: preparing own meals In the last 2 weeks, have you had any of the following?: none Diabetic:: no  Functional Status Activities of Daily Living (to include ambulation/medication): Independent Ambulation: Independent Medication Administration: Independent Home Management: Independent Manage your own finances?: yes Primary transportation is: driving Concerns about vision?: no *vision screening is required for WTM* Concerns about hearing?: no  Fall Screening Falls in the past year?: 0 Number of falls in past year: 0 Was there an injury with Fall?: 0 Fall Risk Category Calculator: 0 Patient Fall Risk Level: Low Fall Risk  Fall Risk Patient at Risk for Falls Due to: No Fall Risks Fall risk Follow up: Falls evaluation completed; Education provided  Home and Transportation Safety: All rugs have non-skid backing?: N/A, no rugs All stairs or steps have railings?: N/A, no stairs Grab bars in the bathtub or shower?: yes Have non-skid surface in bathtub or shower?: yes Good home lighting?: yes Regular seat belt use?: yes Hospital stays in the last year:: no  Cognitive Assessment Difficulty concentrating, remembering, or making decisions? : no (BSE: READING, PUZZLES, QUIZ SHOWS) Will 6CIT or Mini Cog be Completed: no 6CIT or Mini Cog Declined: patient alert, oriented, able to answer questions appropriately and recall recent events  Advance Directives (For Healthcare) Does Patient Have a Medical Advance Directive?: Yes Does patient want to make changes to medical advance directive?: No - Patient declined Type of Advance Directive: Healthcare Power of [place]; Living will Copy of Healthcare Power of Attorney in Chart?: No - copy requested  Reviewed/Updated  Reviewed/Updated: Reviewed All (Medical, Surgical, Family, Medications, Allergies, Care Teams, Patient Goals)        Objective:     Today's Vitals   03/19/24 1156  BP: 124/60  Pulse: 82  Resp: 16  Temp: 97.8 F (36.6 C)  TempSrc: Temporal  SpO2: 98%  Weight: 175 lb (79.4 kg)  Height: 5' 4 (1.626 m)  PainSc: 0-No pain  PainLoc: Generalized   Body mass index is 30.04 kg/m.  Current Medications (verified) Outpatient Encounter Medications as of 03/19/2024  Medication Sig   ferrous gluconate  (FERGON) 324 MG tablet Take 1 tablet (324 mg total) by mouth 2 (two) times daily with a meal.   hydrochlorothiazide  (MICROZIDE ) 12.5 MG capsule Take 12.5 mg by mouth daily.   irbesartan  (AVAPRO ) 150 MG tablet Take 1 tablet (150 mg total) by mouth 2 (two) times daily.   spironolactone  (ALDACTONE ) 25 MG tablet Take 1 tablet (25 mg total) by mouth daily.   empagliflozin  (JARDIANCE ) 10 MG TABS tablet Take 1 tablet (10 mg total) by mouth daily before breakfast. For CHF (Patient not taking: Reported on 03/19/2024)   rosuvastatin  (CRESTOR ) 10 MG tablet Take 1 tablet (10 mg total) by mouth daily. (Patient not taking: Reported on 03/19/2024)   No facility-administered encounter medications on file as of 03/19/2024.   Hearing/Vision screen Hearing Screening - Comments:: Adequate hearing. Vision Screening - Comments:: Wears rx glasses - not up to date with routine eye exams.  Immunizations and Health Maintenance Health Maintenance  Topic Date Due   Pneumococcal Vaccine: 50+ Years (1 of 2 - PCV) Never done   Mammogram  Never done   Colonoscopy  Never done   Zoster Vaccines- Shingrix (1 of 2) Never done   DEXA SCAN  Never done   Influenza Vaccine  12/01/2023   COVID-19 Vaccine (1 - 2025-26 season) Never done   Medicare Annual Wellness (AWV)  03/19/2025   Hepatitis C Screening  Completed   Meningococcal B Vaccine  Aged Out   DTaP/Tdap/Td  Discontinued        Assessment/Plan:  This is a routine wellness examination for April [name].  Patient Care Team: Delbert Clam, MD as PCP - General (Family Medicine) Acharya, Gayatri A,  MD as PCP - Cardiology (Cardiology)  I have personally reviewed and noted the following in the patient's chart:   Medical and social history Use of alcohol, tobacco or illicit drugs  Current medications and supplements including opioid prescriptions. Functional ability and status Nutritional status Physical activity Advanced directives List of other physicians Hospitalizations, surgeries, and ER visits in previous 12 months Vitals Screenings to include cognitive, depression, and falls Referrals and appointments  No orders of the defined types were placed in this encounter.  In addition, I have reviewed and discussed with patient certain preventive protocols, quality metrics, and best practice recommendations. A written personalized care plan for preventive services as well as general preventive health recommendations were provided to patient.   April LOISE Fuller, LPN   [id number]/81/7974   Return in about 1 year (around 03/19/2025) for Medicare wellness.  After Visit Summary: (In Person-Printed) AVS printed and given to the patient  Nurse Notes: Patient aware of current care gaps, will discuss with pcp at next appointment.

## 2024-03-19 NOTE — Patient Instructions (Addendum)
 April [name],  Thank you for taking the time for your Medicare Wellness Visit. I appreciate your continued commitment to your health goals. Please review the care plan we discussed, and feel free to reach out if I can assist you further.  Please note that Annual Wellness Visits do not include a physical exam. Some assessments may be limited, especially if the visit was conducted virtually. If needed, we may recommend an in-person follow-up with your provider.  Ongoing Care Seeing your primary care provider every 3 to 6 months helps us  monitor your health and provide consistent, personalized care.   Referrals If a referral was made during today's visit and you haven't received any updates within two weeks, please contact the referred provider directly to check on the status.  Recommended Screenings:  Health Maintenance  Topic Date Due   Pneumococcal Vaccine for age over 58 (1 of 2 - PCV) Never done   Breast Cancer Screening  Never done   Colon Cancer Screening  Never done   Zoster (Shingles) Vaccine (1 of 2) Never done   DEXA scan (bone density measurement)  Never done   Flu Shot  12/01/2023   COVID-19 Vaccine (1 - 2025-26 season) Never done   Medicare Annual Wellness Visit  03/19/2025   Hepatitis C Screening  Completed   Meningitis B Vaccine  Aged Out   DTaP/Tdap/Td vaccine  Discontinued       03/19/2024   12:06 PM  Advanced Directives  Does Patient Have a Medical Advance Directive? Yes  Type of Estate Agent of [place];Living will  Does patient want to make changes to medical advance directive? No - Patient declined  Copy of Healthcare Power of Attorney in Chart? No - copy requested    Vision: Annual vision screenings are recommended for early detection of glaucoma, cataracts, and diabetic retinopathy. These exams can also reveal signs of chronic conditions such as diabetes and high blood pressure.  Dental: Annual dental screenings help detect early signs  of oral cancer, gum disease, and other conditions linked to overall health, including heart disease and diabetes.  Please see the attached documents for additional preventive care recommendations.

## 2024-03-26 ENCOUNTER — Ambulatory Visit: Attending: Family Medicine | Admitting: Family Medicine

## 2024-03-26 ENCOUNTER — Encounter: Payer: Self-pay | Admitting: Family Medicine

## 2024-03-26 ENCOUNTER — Telehealth: Payer: Self-pay | Admitting: Family Medicine

## 2024-03-26 VITALS — BP 117/63 | HR 85 | Temp 98.3°F | Ht 64.0 in | Wt 182.0 lb

## 2024-03-26 DIAGNOSIS — I129 Hypertensive chronic kidney disease with stage 1 through stage 4 chronic kidney disease, or unspecified chronic kidney disease: Secondary | ICD-10-CM

## 2024-03-26 DIAGNOSIS — M19041 Primary osteoarthritis, right hand: Secondary | ICD-10-CM

## 2024-03-26 DIAGNOSIS — E782 Mixed hyperlipidemia: Secondary | ICD-10-CM | POA: Diagnosis not present

## 2024-03-26 DIAGNOSIS — Z79899 Other long term (current) drug therapy: Secondary | ICD-10-CM

## 2024-03-26 DIAGNOSIS — N183 Chronic kidney disease, stage 3 unspecified: Secondary | ICD-10-CM

## 2024-03-26 DIAGNOSIS — I110 Hypertensive heart disease with heart failure: Secondary | ICD-10-CM

## 2024-03-26 DIAGNOSIS — D509 Iron deficiency anemia, unspecified: Secondary | ICD-10-CM

## 2024-03-26 DIAGNOSIS — I5032 Chronic diastolic (congestive) heart failure: Secondary | ICD-10-CM

## 2024-03-26 DIAGNOSIS — M109 Gout, unspecified: Secondary | ICD-10-CM | POA: Diagnosis not present

## 2024-03-26 MED ORDER — ROSUVASTATIN CALCIUM 10 MG PO TABS: 10.0000 mg | ORAL_TABLET | Freq: Every day | ORAL | 1 refills | Status: AC

## 2024-03-26 NOTE — Patient Instructions (Signed)
 VISIT SUMMARY:  Today, we discussed your chronic kidney disease, hypertension, recent gout flare, iron deficiency anemia, osteoarthritis, and mixed hyperlipidemia. We reviewed your recent test results and addressed your concerns about medication dosages and side effects. We also talked about lifestyle and dietary changes to help manage your conditions.  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 We will continue your vitamin D  and calcium  supplements and have ordered a parathyroid hormone level test. If the hormone levels are significantly off, we may consider adding calcitriol.  -HYPERTENSION AND HYPERTENSIVE HEART DISEASE WITH CHRONIC DIASTOLIC HEART FAILURE: Your blood pressure is well-controlled, but there was some confusion about your labetalol  dosage. We will verify the correct dosage with your pharmacist. Please continue your current blood pressure medications.  -IRON DEFICIENCY ANEMIA: Iron deficiency anemia means you have a lower than normal number of red blood cells due to a lack of iron. We have ordered a complete blood count to check your iron levels and recommended Miralax to help manage constipation caused by your iron supplements.  -GOUT: Gout is a type of arthritis caused by a buildup of uric acid crystals in the joints. You recently had a flare-up and prefer to manage it with dietary changes. We recommend reducing your intake of red meat to help manage your gout.  -OSTEOARTHRITIS OF KNEES AND HIP: Osteoarthritis is a condition where the protective cartilage that cushions the ends of your bones wears down over time. You have pain in your knees and hip, especially when bending. You have chosen to manage this without steroid injections, so please continue your current management plan.  -INCREASED CARDIOVASCULAR RISK: Mixed  hyperlipidemia means you have abnormal levels of different types of fats in your blood. Even though your cholesterol levels are normal, we discussed using rosuvastatin  to reduce your cardiovascular risk, which is 7.3% over the next ten years.  -GENERAL HEALTH MAINTENANCE: You declined a mammogram due to lack of family history and personal preference. We respect your decision.  INSTRUCTIONS:  Please follow up with the lab for your parathyroid hormone level and complete blood count tests. Verify your labetalol  dosage with your pharmacist. Continue with your current medications and dietary changes as discussed. If you have any new symptoms or concerns, please schedule a follow-up appointment.

## 2024-03-26 NOTE — Telephone Encounter (Signed)
 Copied from CRM #[id number]. Topic: General - Other >> Mar 26, 2024  1:35 PM Everette C wrote:  Reason for CRM: The patient has requested contact with a member of clinical staff when possible. They were unable to elaborate on their reason for contact.

## 2024-03-26 NOTE — Telephone Encounter (Signed)
 Contacted patient. Unable to reach patient. Left detailed VM.

## 2024-03-26 NOTE — Progress Notes (Signed)
 Subjective:  Patient ID: April [name], female    DOB: 06/29/1955  Age: 68 y.o. MRN: [id number]  CC: Medical Management of Chronic Issues (Discuss recent test results/)     Discussed the use of AI scribe software for clinical note transcription with the patient, who gave verbal consent to proceed.  History of Present Illness April [name] is a 68 year old female with  a history of hypertension, hyperlipidemia, renal artery stenosis, HFpEF (EF 70-75 % from echo 2021), Gout, CKD stage 3 who presents for follow-up and discussion of recent test results.  She was informed by her nephrologist she had a problem with the parathyroid hormone but is not sure what the levels were. She is unsure of her current calcium  level and wants guidance on lifestyle, diet, and health changes related to this.  She recently had a gout flare involving her foot. She prefers to manage gout with diet changes such as reducing red meat rather than adding or increasing medication.  She takes ferrous sulfate, hydrochlorothiazide , spironolactone  25 mg, and labetalol  200 mg one tablet three times daily for blood pressure. She is confused about the correct labetalol  dose.  It appears this was prescribed by her nephrologist.  She was prescribed Jardiance  but never started it, and rosuvastatin  but has not taken it because 'she has not had high cholesterol'.  She has chronic constipation.  She has arthritic pain in her knees and hip that worsens with certain movements. She declined steroid injections and is managing the pain without medication.    Past Medical History:  Diagnosis Date   Anxiety    Atypical chest pain 03/28/2020   CKD (chronic kidney disease) stage 3, GFR 30-59 ml/min (HCC) 11/01/2019   Hypertension    Iron (Fe) deficiency anemia    Overweight 06/22/2021   Renal artery stenosis 05/14/2021    Past Surgical History:  Procedure Laterality Date   CESAREAN SECTION      Family History  Problem Relation  Age of Onset   CVA Mother    Hypertension Mother    Hypertension Sister    CVA Sister    Hypertension Brother    Hypertension Maternal Grandmother    Kidney failure Neg Hx     Social History   Socioeconomic History   Marital status: Married    Spouse name: Not on file   Number of children: Not on file   Years of education: Not on file   Highest education level: Not on file  Occupational History   Occupation: retired  Tobacco Use   Smoking status: Never   Smokeless tobacco: Never  Substance and Sexual Activity   Alcohol use: Not Currently    Comment: rare   Drug use: Never   Sexual activity: Not on file  Other Topics Concern   Not on file  Social History Narrative   Not on file   Social Drivers of Health   Financial Resource Strain: Low Risk  (12/05/2022)   Overall Financial Resource Strain (CARDIA)    Difficulty of Paying Living Expenses: Not hard at all  Food Insecurity: No Food Insecurity (03/19/2024)   Hunger Vital Sign    Worried About Running Out of Food in the Last Year: Never true    Ran Out of Food in the Last Year: Never true  Transportation Needs: No Transportation Needs (03/19/2024)   PRAPARE - Administrator, Civil Service (Medical): No    Lack of Transportation (Non-Medical): No  Physical Activity:  Sufficiently Active (03/19/2024)   Exercise Vital Sign    Days of Exercise per Week: 4 days    Minutes of Exercise per Session: 40 min  Stress: No Stress Concern Present (03/19/2024)   [employer] of Occupational Health - Occupational Stress Questionnaire    Feeling of Stress: Only a little  Social Connections: Moderately Isolated (03/19/2024)   Social Connection and Isolation Panel    Frequency of Communication with Friends and Family: Three times a week    Frequency of Social Gatherings with Friends and Family: Never    Attends Religious Services: Never    Database Administrator or Organizations: No    Attends Hospital Doctor: Never    Marital Status: Married    Allergies  Allergen Reactions   Amlodipine Other (See Comments)    Dental issues    Aspirin Hives   Clonidine  Derivatives Other (See Comments)    Unknown reaction   Darvon [Propoxyphene Hcl] Other (See Comments)    Dizziness   Lisinopril  Other (See Comments)    Gout    Toprol  Xl [Metoprolol ] Other (See Comments)    Nightmares    Valium [Diazepam] Other (See Comments)    Dizziness    Outpatient Medications Prior to Visit  Medication Sig Dispense Refill   ferrous gluconate  (FERGON) 324 MG tablet Take 1 tablet (324 mg total) by mouth 2 (two) times daily with a meal. 60 tablet 6   hydrochlorothiazide  (MICROZIDE ) 12.5 MG capsule Take 12.5 mg by mouth daily.     irbesartan  (AVAPRO ) 150 MG tablet Take 1 tablet (150 mg total) by mouth 2 (two) times daily. 180 tablet 1   labetalol  (NORMODYNE ) 200 MG tablet Take 400 mg by mouth 3 (three) times daily.     spironolactone  (ALDACTONE ) 25 MG tablet Take 1 tablet (25 mg total) by mouth daily. 90 tablet 1   empagliflozin  (JARDIANCE ) 10 MG TABS tablet Take 1 tablet (10 mg total) by mouth daily before breakfast. For CHF (Patient not taking: Reported on 03/26/2024) 90 tablet 1   rosuvastatin  (CRESTOR ) 10 MG tablet Take 1 tablet (10 mg total) by mouth daily. (Patient not taking: Reported on 03/26/2024) 90 tablet 1   No facility-administered medications prior to visit.     ROS Review of Systems  Constitutional:  Negative for activity change and appetite change.  HENT:  Negative for sinus pressure and sore throat.   Respiratory:  Negative for chest tightness, shortness of breath and wheezing.   Cardiovascular:  Negative for chest pain and palpitations.  Gastrointestinal:  Positive for constipation. Negative for abdominal distention and abdominal pain.  Genitourinary: Negative.   Musculoskeletal: Negative.   Psychiatric/Behavioral:  Negative for behavioral problems and dysphoric mood.      Objective:  BP 117/63   Pulse 85   Temp 98.3 F (36.8 C) (Oral)   Ht 5' 4 (1.626 m)   Wt 182 lb (82.6 kg)   SpO2 99%   BMI 31.24 kg/m      03/26/2024    9:47 AM 03/19/2024   11:56 AM 08/21/2023    2:37 PM  BP/Weight  Systolic BP 117 124 125  Diastolic BP 63 60 64  Wt. (Lbs) 182 175 177  BMI 31.24 kg/m2 30.04 kg/m2 30.38 kg/m2      Physical Exam Constitutional:      Appearance: She is well-developed.  Cardiovascular:     Rate and Rhythm: Normal rate.     Heart sounds: Normal heart sounds. No murmur  heard. Pulmonary:     Effort: Pulmonary effort is normal.     Breath sounds: Normal breath sounds. No wheezing or rales.  Chest:     Chest wall: No tenderness.  Abdominal:     General: Bowel sounds are normal. There is no distension.     Palpations: Abdomen is soft. There is no mass.     Tenderness: There is no abdominal tenderness.  Musculoskeletal:        General: Normal range of motion.     Right lower leg: No edema.     Left lower leg: No edema.  Neurological:     Mental Status: She is alert and oriented to person, place, and time.  Psychiatric:        Mood and Affect: Mood normal.        Latest Ref Rng & Units 05/29/2023   12:00 AM 02/22/2023    9:40 AM 11/29/2022    4:00 PM  CMP  Glucose 70 - 99 mg/dL  94  93   BUN 8 - 27 mg/dL  26  16   Creatinine 9.42 - 1.00 mg/dL  8.44  8.88   Sodium 865 - 144 mmol/L  143  140   Potassium 3.5 - 5.2 mmol/L  5.0  4.2   Chloride 96 - 106 mmol/L  110  108   CO2 20 - 29 mmol/L  20  20   Calcium   10.1     9.9  9.7   Total Protein 6.0 - 8.5 g/dL  6.8  6.8   Total Bilirubin 0.0 - 1.2 mg/dL  0.5  0.5   Alkaline Phos 44 - 121 IU/L  100  109   AST 0 - 40 IU/L  9  16   ALT 0 - 32 IU/L  7  11      This result is from an external source.    Lipid Panel     Component Value Date/Time   CHOL 150 02/22/2023 0940   TRIG 50 02/22/2023 0940   HDL 43 02/22/2023 0940   CHOLHDL 3.5 05/20/2021 0800   LDLCALC 96 02/22/2023  0940    CBC    Component Value Date/Time   WBC 7.2 06/22/2021 1238   RBC 4.53 06/22/2021 1238   HGB 13.5 06/22/2021 1238   HCT 39.6 06/22/2021 1238   PLT 247 06/22/2021 1238   MCV 87.4 06/22/2021 1238   MCH 29.8 06/22/2021 1238   MCHC 34.1 06/22/2021 1238   RDW 12.2 06/22/2021 1238   LYMPHSABS 1.4 06/22/2021 1238   MONOABS 0.5 06/22/2021 1238   EOSABS 0.2 06/22/2021 1238   BASOSABS 0.0 06/22/2021 1238    Lab Results  Component Value Date   HGBA1C 4.9 09/12/2022    The 10-year ASCVD risk score (Arnett DK, et al., 2019) is: 7.3%   Values used to calculate the score:     Age: 79 years     Clincally relevant sex: Female     Is Non-Hispanic African American: Yes     Diabetic: No     Tobacco smoker: No     Systolic Blood Pressure: 117 mmHg     Is BP treated: Yes     HDL Cholesterol: 43 mg/dL     Total Cholesterol: 150 mg/dL     Assessment & Plan Benign hypertension with stage III CKD Blood pressure is controlled Blood pressure well-controlled. Confusion regarding labetalol  dosage due to conflicting prescriptions. - Verify labetalol  dosage and patient to call to update  us  with this information - Continue current antihypertensive regimen. - Will check renal function - Ordered parathyroid hormone level. - Continue vitamin D  and calcium  supplementation. 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 Constipation noted as a side effect of iron supplementation. - Ordered complete blood count to assess iron levels. - Recommended Miralax for constipation management.  Gout Recent flare-up reported. Prefers dietary modifications over medication. - Advised dietary modifications to reduce red meat  intake.  Osteoarthritis of knees and hip Reports pain in knees and hip, particularly when bending. Declined steroid injections. - Currently not on any medications  On statin due to future risk of cardiovascular disease Discussed cardiovascular risk reduction with rosuvastatin  despite normal cholesterol levels. Cardiovascular risk is 7.3% over ten years. - Discussed cardiovascular risk reduction with rosuvastatin .  General Health Maintenance Declined mammogram, bone density, colonoscopy despite encouragement to proceed with health screening personal preference. - Respected decision to decline      Meds ordered this encounter  Medications   rosuvastatin  (CRESTOR ) 10 MG tablet    Sig: Take 1 tablet (10 mg total) by mouth daily.    Dispense:  90 tablet    Refill:  1    Follow-up: Return in about 6 months (around 09/23/2024).       Corrina Sabin, MD, FAAFP. [facility] [place], [place] [phone]   03/26/2024, 1:00 PM

## 2024-03-28 LAB — CMP14+EGFR
ALT: 9 IU/L (ref 0–32)
AST: 17 IU/L (ref 0–40)
Albumin: 4.9 g/dL (ref 3.9–4.9)
Alkaline Phosphatase: 103 IU/L (ref 49–135)
BUN/Creatinine Ratio: 19 (ref 12–28)
BUN: 23 mg/dL (ref 8–27)
Bilirubin Total: 0.6 mg/dL (ref 0.0–1.2)
CO2: 21 mmol/L (ref 20–29)
Calcium: 10.5 mg/dL — ABNORMAL HIGH (ref 8.7–10.3)
Chloride: 105 mmol/L (ref 96–106)
Creatinine, Ser: 1.19 mg/dL — ABNORMAL HIGH (ref 0.57–1.00)
Globulin, Total: 2.5 g/dL (ref 1.5–4.5)
Glucose: 70 mg/dL (ref 70–99)
Potassium: 4.7 mmol/L (ref 3.5–5.2)
Sodium: 141 mmol/L (ref 134–144)
Total Protein: 7.4 g/dL (ref 6.0–8.5)
eGFR: 49 mL/min/1.73 — ABNORMAL LOW (ref >59)

## 2024-03-28 LAB — CBC WITH DIFFERENTIAL/PLATELET
Basophils Absolute: 0 x10E3/uL (ref 0.0–0.2)
Basos: 0 %
EOS (ABSOLUTE): 0.3 x10E3/uL (ref 0.0–0.4)
Eos: 6 %
Hematocrit: 35.3 % (ref 34.0–46.6)
Hemoglobin: 11.4 g/dL (ref 11.1–15.9)
Immature Grans (Abs): 0 x10E3/uL (ref 0.0–0.1)
Immature Granulocytes: 0 %
Lymphocytes Absolute: 1.4 x10E3/uL (ref 0.7–3.1)
Lymphs: 29 %
MCH: 30 pg (ref 26.6–33.0)
MCHC: 32.3 g/dL (ref 31.5–35.7)
MCV: 93 fL (ref 79–97)
Monocytes Absolute: 0.5 x10E3/uL (ref 0.1–0.9)
Monocytes: 10 %
Neutrophils Absolute: 2.6 x10E3/uL (ref 1.4–7.0)
Neutrophils: 55 %
Platelets: 270 x10E3/uL (ref 150–450)
RBC: 3.8 x10E6/uL (ref 3.77–5.28)
RDW: 11.7 % (ref 11.7–15.4)
WBC: 4.9 x10E3/uL (ref 3.4–10.8)

## 2024-03-28 LAB — LP+NON-HDL CHOLESTEROL
Cholesterol, Total: 173 mg/dL (ref 100–199)
HDL: 50 mg/dL (ref >39)
LDL Chol Calc (NIH): 110 mg/dL — ABNORMAL HIGH (ref 0–99)
Total Non-HDL-Chol (LDL+VLDL): 123 mg/dL (ref 0–129)
Triglycerides: 65 mg/dL (ref 0–149)
VLDL Cholesterol Cal: 13 mg/dL (ref 5–40)

## 2024-03-28 LAB — PTH, INTACT AND CALCIUM: PTH: 42 pg/mL (ref 15–65)

## 2024-03-28 LAB — VITAMIN D 25 HYDROXY (VIT D DEFICIENCY, FRACTURES): Vit D, 25-Hydroxy: 51.3 ng/mL (ref 30.0–100.0)

## 2024-03-29 ENCOUNTER — Ambulatory Visit: Payer: Self-pay | Admitting: Family Medicine

## 2024-05-30 ENCOUNTER — Other Ambulatory Visit: Payer: Self-pay | Admitting: Nephrology

## 2024-05-30 DIAGNOSIS — I701 Atherosclerosis of renal artery: Secondary | ICD-10-CM

## 2024-06-17 ENCOUNTER — Other Ambulatory Visit

## 2024-09-24 ENCOUNTER — Ambulatory Visit: Admitting: Family Medicine
